# Patient Record
Sex: Male | Born: 1945
Health system: Southern US, Community
[De-identification: ages and names within clinical notes are randomized; demographics above are authoritative.]

## PROBLEM LIST (undated history)

## (undated) DIAGNOSIS — I10 Essential (primary) hypertension: Secondary | ICD-10-CM

## (undated) DIAGNOSIS — R011 Cardiac murmur, unspecified: Secondary | ICD-10-CM

## (undated) DIAGNOSIS — C61 Malignant neoplasm of prostate: Principal | ICD-10-CM

## (undated) DIAGNOSIS — N529 Male erectile dysfunction, unspecified: Secondary | ICD-10-CM

## (undated) DIAGNOSIS — M1990 Unspecified osteoarthritis, unspecified site: Secondary | ICD-10-CM

## (undated) DIAGNOSIS — I350 Nonrheumatic aortic (valve) stenosis: Secondary | ICD-10-CM

## (undated) DIAGNOSIS — T7840XA Allergy, unspecified, initial encounter: Secondary | ICD-10-CM

## (undated) DIAGNOSIS — E785 Hyperlipidemia, unspecified: Secondary | ICD-10-CM

## (undated) HISTORY — DX: Essential (primary) hypertension: I10

## (undated) HISTORY — DX: Hyperlipidemia, unspecified: E78.5

## (undated) HISTORY — PX: TONSILLECTOMY: SUR1361

## (undated) HISTORY — PX: OTHER SURGICAL HISTORY: SHX169

---

## 1999-01-10 ENCOUNTER — Emergency Department (HOSPITAL_COMMUNITY): Admission: EM | Admit: 1999-01-10 | Discharge: 1999-01-10 | Payer: Self-pay | Admitting: Emergency Medicine

## 2005-04-24 ENCOUNTER — Ambulatory Visit: Payer: Self-pay | Admitting: Internal Medicine

## 2005-07-24 ENCOUNTER — Ambulatory Visit: Payer: Self-pay | Admitting: Internal Medicine

## 2006-03-02 ENCOUNTER — Ambulatory Visit: Payer: Self-pay | Admitting: Internal Medicine

## 2006-06-30 ENCOUNTER — Ambulatory Visit: Payer: Self-pay | Admitting: Family Medicine

## 2006-08-07 ENCOUNTER — Ambulatory Visit: Payer: Self-pay | Admitting: Internal Medicine

## 2006-08-07 LAB — CONVERTED CEMR LAB
ALT: 20 units/L (ref 0–40)
BUN: 15 mg/dL (ref 6–23)
Creatinine, Ser: 1.1 mg/dL (ref 0.4–1.5)
Hgb A1c MFr Bld: 5.3 % (ref 4.6–6.0)
Triglycerides: 180 mg/dL — ABNORMAL HIGH (ref 0–149)

## 2006-08-17 ENCOUNTER — Ambulatory Visit: Payer: Self-pay | Admitting: Internal Medicine

## 2006-10-04 ENCOUNTER — Ambulatory Visit: Payer: Self-pay | Admitting: Internal Medicine

## 2006-10-04 LAB — CONVERTED CEMR LAB
ALT: 20 units/L (ref 0–40)
AST: 16 units/L (ref 0–37)
Albumin: 4.1 g/dL (ref 3.5–5.2)
Alkaline Phosphatase: 96 units/L (ref 39–117)
Basophils Absolute: 0 10*3/uL (ref 0.0–0.1)
HCT: 47.8 % (ref 39.0–52.0)
MCHC: 34.5 g/dL (ref 30.0–36.0)
Monocytes Relative: 7.9 % (ref 3.0–11.0)
Platelets: 188 10*3/uL (ref 150–400)
RBC: 5.55 M/uL (ref 4.22–5.81)
RDW: 12.2 % (ref 11.5–14.6)

## 2006-10-15 ENCOUNTER — Encounter: Admission: RE | Admit: 2006-10-15 | Discharge: 2006-10-15 | Payer: Self-pay | Admitting: Internal Medicine

## 2007-07-18 HISTORY — PX: COLONOSCOPY: SHX174

## 2007-07-26 ENCOUNTER — Ambulatory Visit: Payer: Self-pay | Admitting: Internal Medicine

## 2007-07-26 DIAGNOSIS — K209 Esophagitis, unspecified without bleeding: Secondary | ICD-10-CM | POA: Insufficient documentation

## 2007-07-26 DIAGNOSIS — E782 Mixed hyperlipidemia: Secondary | ICD-10-CM | POA: Insufficient documentation

## 2007-07-26 HISTORY — DX: Esophagitis, unspecified without bleeding: K20.90

## 2007-07-26 HISTORY — DX: Mixed hyperlipidemia: E78.2

## 2007-08-23 ENCOUNTER — Ambulatory Visit: Payer: Self-pay | Admitting: Gastroenterology

## 2007-08-23 ENCOUNTER — Ambulatory Visit: Payer: Self-pay | Admitting: Internal Medicine

## 2007-09-05 ENCOUNTER — Encounter (INDEPENDENT_AMBULATORY_CARE_PROVIDER_SITE_OTHER): Payer: Self-pay | Admitting: *Deleted

## 2007-09-05 ENCOUNTER — Ambulatory Visit: Payer: Self-pay | Admitting: Internal Medicine

## 2007-09-05 LAB — CONVERTED CEMR LAB
ALT: 24 units/L (ref 0–53)
AST: 21 units/L (ref 0–37)
Bilirubin, Direct: 0.1 mg/dL (ref 0.0–0.3)
HDL: 27.5 mg/dL — ABNORMAL LOW (ref >39.0)
Hgb A1c MFr Bld: 5.3 % (ref 4.6–6.0)
PSA: 0.74 ng/mL (ref 0.10–4.00)
Total Bilirubin: 0.7 mg/dL (ref 0.3–1.2)
Triglycerides: 80 mg/dL (ref 0–149)

## 2007-09-06 ENCOUNTER — Ambulatory Visit: Payer: Self-pay | Admitting: Gastroenterology

## 2007-09-06 ENCOUNTER — Encounter: Payer: Self-pay | Admitting: Internal Medicine

## 2007-09-06 LAB — HM COLONOSCOPY

## 2008-01-29 ENCOUNTER — Encounter: Payer: Self-pay | Admitting: Internal Medicine

## 2008-01-30 ENCOUNTER — Encounter (INDEPENDENT_AMBULATORY_CARE_PROVIDER_SITE_OTHER): Payer: Self-pay | Admitting: *Deleted

## 2008-01-30 ENCOUNTER — Encounter: Payer: Self-pay | Admitting: Internal Medicine

## 2008-02-25 ENCOUNTER — Ambulatory Visit: Payer: Self-pay | Admitting: Gastroenterology

## 2008-02-25 DIAGNOSIS — M129 Arthropathy, unspecified: Secondary | ICD-10-CM | POA: Insufficient documentation

## 2008-02-25 DIAGNOSIS — H409 Unspecified glaucoma: Secondary | ICD-10-CM | POA: Insufficient documentation

## 2008-02-25 DIAGNOSIS — H4011X0 Primary open-angle glaucoma, stage unspecified: Secondary | ICD-10-CM

## 2008-02-25 HISTORY — DX: Primary open-angle glaucoma, stage unspecified: H40.11X0

## 2008-02-25 HISTORY — DX: Arthropathy, unspecified: M12.9

## 2008-03-02 ENCOUNTER — Telehealth: Payer: Self-pay | Admitting: Gastroenterology

## 2008-03-03 ENCOUNTER — Encounter: Payer: Self-pay | Admitting: Gastroenterology

## 2008-04-10 ENCOUNTER — Encounter: Payer: Self-pay | Admitting: Internal Medicine

## 2008-04-10 ENCOUNTER — Ambulatory Visit: Payer: Self-pay | Admitting: Gastroenterology

## 2008-04-10 ENCOUNTER — Ambulatory Visit (HOSPITAL_COMMUNITY): Admission: RE | Admit: 2008-04-10 | Discharge: 2008-04-10 | Payer: Self-pay | Admitting: Gastroenterology

## 2008-04-15 ENCOUNTER — Encounter: Payer: Self-pay | Admitting: Gastroenterology

## 2008-05-13 ENCOUNTER — Ambulatory Visit: Payer: Self-pay | Admitting: Gastroenterology

## 2009-01-07 ENCOUNTER — Ambulatory Visit: Payer: Self-pay | Admitting: Internal Medicine

## 2009-01-07 DIAGNOSIS — I10 Essential (primary) hypertension: Secondary | ICD-10-CM | POA: Insufficient documentation

## 2009-01-07 DIAGNOSIS — K5730 Diverticulosis of large intestine without perforation or abscess without bleeding: Secondary | ICD-10-CM

## 2009-01-07 DIAGNOSIS — R739 Hyperglycemia, unspecified: Secondary | ICD-10-CM | POA: Insufficient documentation

## 2009-01-07 HISTORY — DX: Hyperglycemia, unspecified: R73.9

## 2009-01-07 HISTORY — DX: Essential (primary) hypertension: I10

## 2009-01-07 HISTORY — DX: Diverticulosis of large intestine without perforation or abscess without bleeding: K57.30

## 2009-01-20 ENCOUNTER — Encounter (INDEPENDENT_AMBULATORY_CARE_PROVIDER_SITE_OTHER): Payer: Self-pay | Admitting: *Deleted

## 2009-01-26 ENCOUNTER — Telehealth (INDEPENDENT_AMBULATORY_CARE_PROVIDER_SITE_OTHER): Payer: Self-pay | Admitting: *Deleted

## 2009-04-01 ENCOUNTER — Ambulatory Visit: Payer: Self-pay | Admitting: Internal Medicine

## 2009-04-04 LAB — CONVERTED CEMR LAB
ALT: 23 units/L (ref 0–53)
AST: 20 units/L (ref 0–37)
Alkaline Phosphatase: 67 units/L (ref 39–117)
Bilirubin, Direct: 0.2 mg/dL (ref 0.0–0.3)
Total Bilirubin: 1.2 mg/dL (ref 0.3–1.2)
Total CHOL/HDL Ratio: 4

## 2009-04-05 ENCOUNTER — Encounter (INDEPENDENT_AMBULATORY_CARE_PROVIDER_SITE_OTHER): Payer: Self-pay | Admitting: *Deleted

## 2009-05-21 ENCOUNTER — Telehealth (INDEPENDENT_AMBULATORY_CARE_PROVIDER_SITE_OTHER): Payer: Self-pay | Admitting: *Deleted

## 2009-06-21 ENCOUNTER — Encounter: Payer: Self-pay | Admitting: Internal Medicine

## 2009-06-22 ENCOUNTER — Ambulatory Visit (HOSPITAL_BASED_OUTPATIENT_CLINIC_OR_DEPARTMENT_OTHER): Admission: RE | Admit: 2009-06-22 | Discharge: 2009-06-22 | Payer: Self-pay | Admitting: Orthopedic Surgery

## 2010-02-17 ENCOUNTER — Encounter (INDEPENDENT_AMBULATORY_CARE_PROVIDER_SITE_OTHER): Payer: Self-pay | Admitting: *Deleted

## 2010-02-17 DIAGNOSIS — M1710 Unilateral primary osteoarthritis, unspecified knee: Secondary | ICD-10-CM

## 2010-02-17 DIAGNOSIS — IMO0002 Reserved for concepts with insufficient information to code with codable children: Secondary | ICD-10-CM

## 2010-02-17 HISTORY — DX: Reserved for concepts with insufficient information to code with codable children: IMO0002

## 2010-03-16 ENCOUNTER — Telehealth (INDEPENDENT_AMBULATORY_CARE_PROVIDER_SITE_OTHER): Payer: Self-pay | Admitting: *Deleted

## 2010-05-06 ENCOUNTER — Ambulatory Visit: Payer: Self-pay | Admitting: Internal Medicine

## 2010-05-07 LAB — CONVERTED CEMR LAB
Alkaline Phosphatase: 81 units/L (ref 39–117)
Basophils Absolute: 0 10*3/uL (ref 0.0–0.1)
Basophils Relative: 0.3 % (ref 0.0–3.0)
Bilirubin, Direct: 0.2 mg/dL (ref 0.0–0.3)
Calcium: 9.1 mg/dL (ref 8.4–10.5)
Chloride: 107 meq/L (ref 96–112)
Eosinophils Absolute: 0.1 10*3/uL (ref 0.0–0.7)
GFR calc non Af Amer: 99.03 mL/min (ref >60)
Glucose, Bld: 95 mg/dL (ref 70–99)
LDL Cholesterol: 77 mg/dL (ref 0–99)
Leukocytes, UA: NEGATIVE
Lymphocytes Relative: 25.4 % (ref 12.0–46.0)
MCHC: 35 g/dL (ref 30.0–36.0)
Neutrophils Relative %: 63.6 % (ref 43.0–77.0)
Nitrite: NEGATIVE
PSA: 1.72 ng/mL (ref 0.10–4.00)
Potassium: 4.6 meq/L (ref 3.5–5.1)
RBC: 5.22 M/uL (ref 4.22–5.81)
Specific Gravity, Urine: 1.025 (ref 1.000–1.030)
Total CHOL/HDL Ratio: 3
Total Protein: 6.4 g/dL (ref 6.0–8.3)
Urine Glucose: NEGATIVE mg/dL
Urobilinogen, UA: 0.2 (ref 0.0–1.0)
VLDL: 14.2 mg/dL (ref 0.0–40.0)

## 2010-05-13 ENCOUNTER — Ambulatory Visit: Payer: Self-pay | Admitting: Internal Medicine

## 2010-05-13 ENCOUNTER — Encounter: Payer: Self-pay | Admitting: Internal Medicine

## 2010-07-17 HISTORY — PX: TOTAL KNEE ARTHROPLASTY: SHX125

## 2010-08-08 LAB — CBC
HCT: 47.6 % (ref 39.0–52.0)
Hemoglobin: 16.9 g/dL (ref 13.0–17.0)
RBC: 5.54 MIL/uL (ref 4.22–5.81)
WBC: 6.5 10*3/uL (ref 4.0–10.5)

## 2010-08-08 LAB — SURGICAL PCR SCREEN: MRSA, PCR: NEGATIVE

## 2010-08-08 LAB — COMPREHENSIVE METABOLIC PANEL
ALT: 18 U/L (ref 0–53)
Albumin: 4.1 g/dL (ref 3.5–5.2)
Alkaline Phosphatase: 86 U/L (ref 39–117)
BUN: 18 mg/dL (ref 6–23)
GFR calc Af Amer: >60 mL/min (ref >60)
Potassium: 4.9 mEq/L (ref 3.5–5.1)
Sodium: 141 mEq/L (ref 135–145)
Total Protein: 7.3 g/dL (ref 6.0–8.3)

## 2010-08-08 LAB — URINALYSIS, ROUTINE W REFLEX MICROSCOPIC
Urine Glucose, Fasting: NEGATIVE mg/dL
pH: 7 (ref 5.0–8.0)

## 2010-08-08 LAB — APTT: aPTT: 30 seconds (ref 24–37)

## 2010-08-08 LAB — PROTIME-INR: INR: 1.09 (ref 0.00–1.49)

## 2010-08-12 ENCOUNTER — Inpatient Hospital Stay (HOSPITAL_COMMUNITY)
Admission: RE | Admit: 2010-08-12 | Discharge: 2010-08-15 | Payer: Self-pay | Source: Home / Self Care | Attending: Orthopedic Surgery | Admitting: Orthopedic Surgery

## 2010-08-12 LAB — TYPE AND SCREEN

## 2010-08-12 LAB — ABO/RH: ABO/RH(D): A POS

## 2010-08-12 NOTE — H&P (Signed)
[name], [name] NO.:  [id number]  MEDICAL RECORD NO.:  [id number]           PATIENT TYPE:  LOCATION:                                 FACILITY:  PHYSICIAN:  [name], [facility]    DATE OF BIRTH:  1946-04-11  DATE OF ADMISSION: DATE OF DISCHARGE:                             HISTORY & PHYSICAL   CHIEF COMPLAINT:  Left knee pain.  BRIEF HISTORY:  [name] has been followed by Dr. Lequita Halt for worsening pain in both knees, worse on the left than the right.  He has had intra-articular cortisone injections with minimal relief. Unfortunately, he continues to have functional issues with the knees and he feels that these are continuing to worsen.  He is unable to perform daily tasks without extreme discomfort.  He now presents for a left total knee arthroplasty and cortisone injection into the right knee.  MEDICATION ALLERGIES:  PENICILLIN, this causes anaphylaxis. Denies allergies to food, latex or metal.  PRIMARY CARE PHYSICIAN:  [name]. Jack Ren, MD, FACP, FCCP.  He has been cleared for surgery by Dr. Alwyn Bryant.  GASTROENTEROLOGIST:  [name]. Jack Dice, MD, Clementeen Graham.  CURRENT MEDICATIONS: 1. Benazepril HCl 20 mg 1/2 tablet daily. 2. Vytorin 10/20 mg 1 tablet p.o. q.h.s. 3. Aspirin 81 mg 1 tablet p.o. daily.  This has been discontinued. 4. Betimol 0.25% solution 1 drop right eye daily. 5. Advil as needed for pain.  He has, however, discontinued this.  PAST MEDICAL HISTORY: 1. End-stage arthritis of both knees, left worse than right. 2. Tinnitus. 3. Hypertension. 4. Heart murmur. 5. Hyperlipidemia. 6. Gastroesophageal reflux disease. 7. Arthritis.  PAST SURGICAL HISTORY: 1. Tonsillectomy. 2. Surgery for an undescended testicle.  FAMILY MEDICAL HISTORY:  Father passed at the age of 32 of CVA.  Mother passed at the age of 103 of myocardial infarction.  SOCIAL HISTORY:  The patient is married.  He works as an Biomedical scientist. He denies past or present  use of tobacco or alcohol.  He lives at home with his wife, and he does plan to go home following his hospital stay. His wife is lined up to be his caregiver.  REVIEW OF SYSTEMS:  GENERAL:  Negative for fevers, chills, or weight change.  HEENT/NEURO:  Positive for ringing in the ears.  Negative for headache, blurred vision, or blackout spells.  DERMATOLOGIC:  Negative for a rash or lesion.  RESPIRATORY:  Negative for shortness of breath at rest or with exertion.  CARDIOVASCULAR:  Negative for chest pain or palpitations.  GI:  Positive for occasional nausea and abdominal pain. GU: Positive for urinating at nighttime.  Negative for hematuria or dysuria.  MUSCULOSKELETAL:  Positive for joint pain, muscular pain, back pain, and morning stiffness.  PHYSICAL EXAMINATION:  VITAL SIGNS:  Pulse 80, respirations 18, blood pressure 122/82 in the left arm. GENERAL:  [name] is alert and oriented x3.  He is a well- developed, well-nourished 65 year old male.  He has a stated height of 61 inches and a stated weight of 255 pounds.  He is in no apparent distress. NECK:  Supple, full range of motion without lymphadenopathy. CHEST:  Lungs  are clear to auscultation bilaterally without wheezes, rhonchi, or rales. HEART:  Regular rate and rhythm without murmur. ABDOMEN:  Bowel sounds present in all 4 quadrants.  No tenderness with palpation. EXTREMITIES:  Both knees reveal marked crepitus throughout the range of motion.  He has valgus deformity bilaterally, worse on the left than the right. SKIN:  Unremarkable. NEUROLOGIC:  Intact peripheral vascular, carotid pulses 2+ bilaterally without bruit.  RADIOGRAPHS:  AP lateral views of both knees, end-stage arthritic changes tricompartmentally, left worse than right.  Valgus deformity bilaterally.  IMPRESSION:  End-stage arthritis of both knees, left worse than right.  PLAN:  Left total knee arthroplasty with cortisone injection into the right  knee to be performed by Dr. Lequita Halt.     [name], [facility]     LD/MEDQ  D:  08/11/2010  T:  08/11/2010  Job:  [id number]  cc:   [name]. Jack Ren, MD,FACP,FCCP [phone] [place] [address]  Electronically Signed by [name]  on 08/12/2010 02:28:25 PM

## 2010-08-13 LAB — BASIC METABOLIC PANEL
Calcium: 8.5 mg/dL (ref 8.4–10.5)
GFR calc Af Amer: >60 mL/min (ref >60)
GFR calc non Af Amer: >60 mL/min (ref >60)
Potassium: 4.5 mEq/L (ref 3.5–5.1)
Sodium: 135 mEq/L (ref 135–145)

## 2010-08-13 LAB — CBC
Platelets: 162 10*3/uL (ref 150–400)
RDW: 12.8 % (ref 11.5–15.5)
WBC: 16.5 10*3/uL — ABNORMAL HIGH (ref 4.0–10.5)

## 2010-08-14 LAB — CONVERTED CEMR LAB
ALT: 23 units/L (ref 0–53)
AST: 23 units/L (ref 0–37)
BUN: 19 mg/dL (ref 6–23)
Basophils Absolute: 0 10*3/uL (ref 0.0–0.1)
Basophils Relative: 0 % (ref 0.0–3.0)
Bilirubin, Direct: 0 mg/dL (ref 0.0–0.3)
CO2: 27 meq/L (ref 19–32)
Chloride: 106 meq/L (ref 96–112)
Creatinine, Ser: 0.9 mg/dL (ref 0.4–1.5)
Eosinophils Absolute: 0.1 10*3/uL (ref 0.0–0.7)
HDL goal, serum: 40 mg/dL
Hemoglobin: 17.3 g/dL — ABNORMAL HIGH (ref 13.0–17.0)
Hgb A1c MFr Bld: 5.4 % (ref 4.6–6.5)
LDL Goal: 130 mg/dL
Lymphocytes Relative: 27.5 % (ref 12.0–46.0)
Lymphs Abs: 1.8 10*3/uL (ref 0.7–4.0)
MCHC: 34.5 g/dL (ref 30.0–36.0)
MCV: 87.4 fL (ref 78.0–100.0)
Monocytes Absolute: 0.6 10*3/uL (ref 0.1–1.0)
Neutro Abs: 3.9 10*3/uL (ref 1.4–7.7)
Potassium: 4.5 meq/L (ref 3.5–5.1)
RDW: 12.7 % (ref 11.5–14.6)
Total Protein: 7.3 g/dL (ref 6.0–8.3)

## 2010-08-14 LAB — CBC
HCT: 35.8 % — ABNORMAL LOW (ref 39.0–52.0)
Hemoglobin: 12.4 g/dL — ABNORMAL LOW (ref 13.0–17.0)
RDW: 13.1 % (ref 11.5–15.5)
WBC: 14.5 10*3/uL — ABNORMAL HIGH (ref 4.0–10.5)

## 2010-08-14 LAB — BASIC METABOLIC PANEL
CO2: 29 mEq/L (ref 19–32)
GFR calc Af Amer: >60 mL/min (ref >60)
GFR calc non Af Amer: >60 mL/min (ref >60)
Glucose, Bld: 124 mg/dL — ABNORMAL HIGH (ref 70–99)
Potassium: 4.3 mEq/L (ref 3.5–5.1)
Sodium: 138 mEq/L (ref 135–145)

## 2010-08-15 LAB — CBC
HCT: 34 % — ABNORMAL LOW (ref 39.0–52.0)
Hemoglobin: 11.7 g/dL — ABNORMAL LOW (ref 13.0–17.0)
MCHC: 34.4 g/dL (ref 30.0–36.0)
RBC: 3.93 MIL/uL — ABNORMAL LOW (ref 4.22–5.81)

## 2010-08-16 NOTE — Progress Notes (Signed)
Summary: lab order  Phone Note Call from Patient   Summary of Call: patient is scheduled for cpx [id number] - lab at [facility] [id number] - need lab order Initial call taken by: Okey Regal Spring,  March 16, 2010 2:42 PM  Follow-up for Phone Call        Lipid,Hep,BMP,TSH,CBCD, PSA, Udip, Stool Cards v70.0/272.4/995.20/401.9 Follow-up by: Shonna Chock CMA,  March 16, 2010 2:59 PM  Additional Follow-up for Phone Call Additional follow up Details #1::        lab order added to appt .Marland KitchenOkey Regal Spring  March 17, 2010 8:39 AM

## 2010-08-16 NOTE — Miscellaneous (Signed)
Summary: Orders Update   Clinical Lists Changes  Problems: Added new problem of DEGENERATIVE JOINT DISEASE, BOTH KNEES, SEVERE (ICD-715.96) Orders: Added new Referral order of Orthopedic Surgeon Referral (Ortho Surgeon) - Signed

## 2010-08-16 NOTE — Assessment & Plan Note (Signed)
Summary: CPX /CBS   Vital Signs:  Patient profile:   65 year old male Height:      72.25 inches Weight:      258 pounds BMI:     34.87 Temp:     98.5 degrees F oral Pulse rate:   72 / minute Resp:     16 per minute BP sitting:   126 / 84  (left arm) Cuff size:   large  Vitals Entered By: Shonna Chock CMA (May 13, 2010 1:48 PM)   Primary Care Provider:  [name], M.D.  CC:  Lower Extremity Joint pain.  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      He is also  presents for Hyperlipidemia follow-up.  The patient denies muscle aches, GI upset, abdominal pain, flushing, itching, constipation, diarrhea, and fatigue.  The patient denies the following symptoms: chest pain/pressure, exercise intolerance, dypsnea, palpitations, syncope, and pedal edema.  Compliance with medications (by patient report) has been near 100%.  Dietary compliance has been good.  The patient reports no exercise due to  DJD of knees.        The patient also presents for Hypertension follow-up.  The patient denies lightheadedness, urinary frequency, and headaches.  Compliance with medications (by patient report) has been near 100%.  Adjunctive measures currently used by the patient include salt restriction.  BP averages in  120s/low 80s.      The patient  sees Dr Despina Hick for knee pain; S/P steroid injections X 1. L TKR is being discussed. The patient reports popping and decreased ROM, but denies swelling, redness, giving away, locking, stiffness for >1 hr, and weakness.  The pain is described as aching, intermittent, and activity related.    Lipid Management History:      Positive NCEP/ATP III risk factors include male age 65 years old or older, HDL cholesterol less than 40, family history for ischemic heart disease (females less than 39 years old), and hypertension.  Negative NCEP/ATP III risk factors include non-diabetic, non-tobacco-user status, no ASHD  (atherosclerotic heart disease), no prior stroke/TIA, no peripheral vascular disease, and no history of aortic aneurysm.     Current Medications (verified): 1)  Benazepril Hcl 20 Mg  Tabs (Benazepril Hcl) .... 1/2 Tab By Mouth Once Daily 2)  Vytorin 10-20 Mg  Tabs (Ezetimibe-Simvastatin) .Marland Kitchen.. 1 At Bedtime (Ldl Goal = < 70) 3)  Betimol 0.25 %  Soln (Timolol) .... Insert 1 Drop Into Right Eye Daily  Allergies: 1)  ! Pcn  Past History:  Past Medical History: GERD Hyperlipidemia: Framingham Study LDL goal = < 130. NMR Lipoprofile : LDL 93(1800/ 1237) , HDL 38, TG 153. Hypertension Diverticulosis, colon Glaucoma , open angle ; DJD ; Vitiligo  Past Surgical History: Undescended testicle surgically dropped @ 5  Tonsillectomy LBS, hospitalized five days (no surgery) Flex sigmoid 2000; colonoscopy 2009 : Tics  , due 2019; Endoscopy  2009 negative, Dr Arlyce Dice  Family History: mother: MI @ 68, smoker father: CVA; twin bro: glaucoma No FH of Colon Cancer:  Social History: Never Smoked Alcohol Use - no Daily Caffeine Use-1/2 cup daily Married  Review of Systems  The patient denies anorexia, fever, vision loss, decreased hearing, hoarseness, prolonged cough, hemoptysis, abdominal pain, melena, hematochezia, severe indigestion/heartburn, hematuria, suspicious skin lesions, depression, unusual weight change, abnormal bleeding, enlarged lymph nodes, and angioedema.    Physical Exam  General:  well-nourished;alert,appropriate and cooperative throughout examination  Head:  Normocephalic and atraumatic without obvious abnormalities.  Eyes:  No corneal or conjunctival inflammation noted.  Perrla. Funduscopic exam benign, without hemorrhages, exudates or papilledema.  Ears:  External ear exam shows no significant lesions or deformities.  Otoscopic examination reveals clear canals, tympanic membranes are intact bilaterally without bulging, retraction, inflammation or discharge. Hearing is  grossly normal bilaterally. Nose:  External nasal examination shows no deformity or inflammation. Nasal mucosa are pink and moist without lesions or exudates. Septal deviation Mouth:  Oral mucosa and oropharynx without lesions or exudates.  Teeth in good repair. Neck:  No deformities, masses, or tenderness noted. Lungs:  Normal respiratory effort, chest expands symmetrically. Lungs are clear to auscultation, no crackles or wheezes. Heart:  normal rate, regular rhythm, no gallop, no rub, no JVD, no HJR, and grade 1.5  /6 systolic murmur loudest @ R base.   Abdomen:  Bowel sounds positive,abdomen soft and non-tender without masses, organomegaly or hernias noted. Rectal:  No external abnormalities noted. Normal sphincter tone. No rectal masses or tenderness. Genitalia:  Testes bilaterally descended without nodularity, tenderness or masses. L atrophic. No scrotal masses or lesions. No penis lesions or urethral discharge. Prostate:  Prostate gland firm and smooth, no enlargement, nodularity, tenderness, mass, asymmetry or induration. Msk:  No deformity or scoliosis noted of thoracic or lumbar spine but R thoracic muscles > L.   Pulses:  R and L carotid,radial,dorsalis pedis and posterior tibial pulses are full and equal bilaterally Extremities:  No clubbing, cyanosis, edema. L 3rd -5th fingers deviated laterally ( post fractures). Varus knee changes with marked crepitus Neurologic:  alert & oriented X3 and DTRs symmetrical and normal.   Skin:  Vitiligo of hands  Cervical Nodes:  No lymphadenopathy noted Axillary Nodes:  No palpable lymphadenopathy Inguinal Nodes:  No significant adenopathy Psych:  memory intact for recent and remote, normally interactive, and good eye contact.     Impression & Recommendations:  Problem # 1:  ROUTINE GENERAL MEDICAL EXAM@HEALTH  CARE FACL (ICD-V70.0)  Orders: EKG w/ Interpretation (93000)  Problem # 2:  HYPERLIPIDEMIA (ICD-272.2)  His updated medication list  for this problem includes:    Vytorin 10-20 Mg Tabs (Ezetimibe-simvastatin) .Marland Kitchen... 1 at bedtime (ldl goal = < 70)  Problem # 3:  HYPERTENSION (ICD-401.9) controlled His updated medication list for this problem includes:    Benazepril Hcl 20 Mg Tabs (Benazepril hcl) .Marland Kitchen... 1/2 tab by mouth once daily  Problem # 4:  DEGENERATIVE JOINT DISEASE, BOTH KNEES, SEVERE (ICD-715.96) as per Dr Despina Hick. He is cleared medically for surgery.  Complete Medication List: 1)  Benazepril Hcl 20 Mg Tabs (Benazepril hcl) .... 1/2 tab by mouth once daily 2)  Vytorin 10-20 Mg Tabs (Ezetimibe-simvastatin) .Marland Kitchen.. 1 at bedtime (ldl goal = < 70) 3)  Betimol 0.25 % Soln (Timolol) .... Insert 1 drop into right eye daily  Other Orders: Tdap => 38yrs IM ([address]) Admin 1st Vaccine (98119)  Lipid Assessment/Plan:      Based on NCEP/ATP III, the patient's risk factor category is "0-1 risk factors".  The patient's lipid goals are as follows: Total cholesterol goal is 200; LDL cholesterol goal is 130; HDL cholesterol goal is 40; Triglyceride goal is 150.  His LDL cholesterol goal has been met.    Patient Instructions: 1)  Take an  81 mg  coated Aspirin every day. Consider Pravastatin  40 mg at bedtime in place of Vytorin 10/40 with fasting labs 10 weeks later ( lipids, hepatic panel; 272.4, 995.20) Prescriptions:  BENAZEPRIL HCL 20 MG  TABS (BENAZEPRIL HCL) 1/2 tab by mouth once daily  #90 Tablet x 3   Entered by:   Shonna Chock CMA   Authorized by:   [name] MD   Signed by:   Shonna Chock CMA on 05/13/2010   Method used:   Electronically to        [facility] (retail)       [address]       [place], [place]  [address]       Ph: [phone]       Fax: [phone]   RxID:   [id number]    Orders Added: 1)  Est. Patient 40-64 years [99396] 2)  EKG w/ Interpretation [93000] 3)  Tdap => 78yrs IM [90715] 4)  Admin 1st Vaccine [90471]   Immunizations  Administered:  Tetanus Vaccine:    Vaccine Type: Tdap    Site: right deltoid    Mfr: GlaxoSmithKline    Dose: 0.5 ml    Route: IM    Given by: Shonna Chock CMA    Exp. Date: 05/05/2012    Lot #: [id number]   Immunizations Administered:  Tetanus Vaccine:    Vaccine Type: Tdap    Site: right deltoid    Mfr: GlaxoSmithKline    Dose: 0.5 ml    Route: IM    Given by: Shonna Chock CMA    Exp. Date: 05/05/2012    Lot #: [id number]

## 2010-08-17 NOTE — Op Note (Signed)
[name], [name]            ACCOUNT NO.:  [id number]  MEDICAL RECORD NO.:  [id number]          PATIENT TYPE:  INP  LOCATION:  0003                         FACILITY[facility]  PHYSICIAN:  Ollen Gross, M.D.    DATE OF BIRTH:  06-Mar-1946  DATE OF PROCEDURE: DATE OF DISCHARGE:                              OPERATIVE REPORT   PREOPERATIVE DIAGNOSIS:  Osteoarthritis, bilateral knees.  POSTOPERATIVE DIAGNOSIS:  Osteoarthritis, bilateral knees.  PROCEDURE: 1. Left total knee arthroplasty. 2. Right knee cortisone injection.  SURGEON:  Ollen Gross, MD  ASSISTANT:  Alexzandrew L. Perkins, PA-C  ANESTHESIA:  Spinal.  ESTIMATED BLOOD LOSS:  Minimal.  DRAIN:  Hemovac x1, left knee.  TOURNIQUET TIME:  42 minutes at 300 mmHg.  COMPLICATIONS:  None.  CONDITION:  Stable to recovery.  BRIEF CLINICAL NOTE:  [name] is a 65 year old male who has end-stage arthritis in both knees, left more symptomatic than the right.  He has had failed nonoperative management, presents now for left total knee arthroplasty and right knee cortisone injection.  PROCEDURE IN DETAIL:  After successful administration of spinal anesthetic, a tourniquet was placed high on his left thigh and his left lower extremity prepped and draped in usual sterile fashion. Extremities wrapped in Esmarch, knee flexed, tourniquet inflated to 300 mmHg.  Midline incision was made with a 10-blade through the subcutaneous tissue to the level of the extensor mechanism.  He has a severe valgus deformity, that is why I made a lateral parapatellar approach.  Fresh blade was used to make a lateral parapatellar arthrotomy.  Soft tissue over the proximal and lateral tibias then subperiosteally elevated to the joint line and posterolateral corner but not including the structures of the posterolateral corner.  Patella was everted medially, knee flexed 90 degrees, and ACL and PCL removed. Drill was used create a starting hole in  the distal femur and the canal was thoroughly irrigated.  The 5-degree left valgus alignment guide is placed and the distal femoral cutting block pinned to remove a 11 mm off the distal femur.  Distal femoral resection is made with an oscillating saw.  Tibia subluxed forward and menisci removed.  The extramedullary tibial alignment guide is placed referencing proximally at the medial aspect of the tibial tubercle and distally along the second metatarsal axis and tibial crest.  The block is pinned to remove 2 mm off the more deficient lateral side.  Tibial resection is made with an oscillating saw.  Size 4 is the most appropriate tibial component and the proximal tibia was prepared to modular drill and keel punch for the size 4.  Femoral sizing guide was placed, size 5 is most appropriate.  Rotations marked off the epicondylar axis and confirmed by creating rectangular flexion gap at 90 degrees.  Size 5 cutting block is placed and the anterior-posterior and chamfer cuts made.  Intercondylar block was placed and neck cuts made.  Trial size 5 posterior stabilized femur was placed.  A 10-mm posterior stabilized rotating platform insert trial was placed.  With the 10, full extensions achieved with excellent varus- valgus and anterior-posterior balance throughout full range of motion. Patella was everted,  thickness measured to be 24 mm.  Freehand resection taken to 14 mm, 38 template is placed, lug holes were drilled, trial patella was placed and it tracks normally.  Osteophytes removed off the posterior femur with the trial in place.  All trials were removed and the cut bone surfaces prepared with pulsatile lavage.  Cement was mixed and once ready for implantation, a size 4 mobile bearing tibial tray and size 5 posterior stabilized femur and 38 patella cemented into place and patella was held with a clamp.  Trial 10-mm inserts placed, knee held in full extension, all extruded cement removed.   When the cement fully hardened, then the permanent 10-mm posterior stabilized rotating platform insert is placed in the tibial tray.  Wounds copiously irrigated with saline solution and the arthrotomy closed over Hemovac drain with interrupted #1 PDS leaving open a small area from the superior to inferior pole of the patella to serve as a mini lateral release.  Flexion against the gravity is 135 degrees and patella tracks normally.  Tourniquet has already been released after 42 minutes.  Subcu was closed with interrupted 2-0 Vicryl and subcuticular running 4-0 Monocryl.  The incisions cleaned and dried and Steri-Strips applied. The catheter for Marcaine pain pumps placed and pumps initiated.  A bulky sterile dressing was applied and he was placed into a knee immobilizer.  I then prepped the right knee, injected with 6 mL of 1% lidocaine and 80 mg of Depo-Medrol.  He tolerated both procedures well without complications.  Left lower extremity was then placed into the knee immobilizer.  He is awakened, transported to recovery in stable condition.     Ollen Gross, M.D.     FA/MEDQ  D:  08/12/2010  T:  08/12/2010  Job:  [id number]  Electronically Signed by Ollen Gross M.D. on 08/17/2010 04:54:09 PM

## 2010-09-07 NOTE — Discharge Summary (Signed)
[name], Bryant            ACCOUNT NO.:  [id number]  MEDICAL RECORD NO.:  [id number]          PATIENT TYPE:  INP  LOCATION:  1607                         [facility]ACILITY[facility]  PHYSICIAN:  Ollen Gross, M.D.    DATE OF BIRTH:  1945/12/20  DATE OF ADMISSION:  08/12/2010 DATE OF DISCHARGE:  08/15/2010                              DISCHARGE SUMMARY   ADMITTING DIAGNOSES: 1. Osteoarthritis, left knee, greater than right knee. 2. Tinnitus. 3. Hypertension. 4. Cardiac murmur. 5. Hyperlipidemia. 6. Gastroesophageal reflux disease. 7. Osteoarthritis.  DISCHARGE DIAGNOSES: 1. Osteoarthritis, bilateral knees, status post left total knee     replacement arthroplasty with cortisone injection into the right     knee. 2. Tinnitus. 3. Hypertension. 4. Cardiac murmur. 5. Hyperlipidemia. 6. Gastroesophageal reflux disease. 7. Osteoarthritis.  PROCEDURE:  August 12, 2010, left total knee arthroplasty with a right knee cortisone injection.  SURGEON:  Ollen Gross, M.D.  ASSISTANT:  Alexzandrew L. Perkins, P.A.C.  ANESTHESIA:  Spinal anesthesia.  TOURNIQUET TIME:  42 minutes.  CONSULTS:  None.  BRIEF HISTORY:  The patient is a 65 year old male with end-stage arthritis of both knees, left is more problematic and symptomatic than the right, failed nonoperative management, now presents for total knee arthroplasty on the left  cortisone injection in the right.  LABORATORY DATA:  Preop CBC showed a hemoglobin of 16.9, hematocrit of 47.6, white cell count 6.5, platelets 181.  Postop hemoglobin 13.9, then 12.4.  Last noted hemoglobin 11.7 and hematocrit 34.0.  PT/INR 14.3 and 1.09 with a PTT of 30.  Chem panel on admission all within normal limits.  Serial BMETs were followed, electrolytes remained within normal limits; glucose went up from 116 to 161, back down to 124.  Preop UA negative.  Blood group type A positive.  Nasal swabs were positive for Staph aureus, but negative for  MRSA.  HOSPITAL COURSE:  The patient was admitted to [facility], taken to OR, underwent the above-stated procedure without complication. The patient tolerated the procedure well, later transferred to recovery room and then to orthopedic floor, started on p.o. and IV analgesics for pain control following surgery.  Doing pretty well on the morning of day #1, weaned over to pills.  Hemovac drain pulled without difficulty, excellent urinary output.  Starting to get up out of bed, working with therapy.  By day #2, he was doing much better, progressing well. Dressing changed, incision looked good.  Hemoglobin was stable.  He remained in the hospital.  By day #3, he was seen by Dr. Lequita Halt, doing great, meeting his goals, and discharged home.  DISCHARGE PLAN: 1. The patient was discharged home on August 15, 2010. 2. Discharge diagnoses, please see above. 3. Discharge meds:  Percocet, Xarelto, and Robaxin.  Continue aspirin,     benazepril, timolol eye drops, and Vytorin  DIET:  Heart-healthy diet.ACTIVITY:  He is weightbearing as tolerated, total knee protocol, home health PT, home nursing.  FOLLOWUP:  In 2 weeks.  DISPOSITION:  Home.  CONDITION ON DISCHARGE:  Improved.     Alexzandrew L. Julien Girt, P.A.C.   ______________________________ Ollen Gross, M.D.    ALP/MEDQ  D:  09/01/2010  T:  09/02/2010  Job:  [id number]  cc:   Titus Dubin. Alwyn Ren, MD,FACP,FCCP [phone] [place] [address]  Electronically Signed by Patrica Duel P.A.C. on 09/06/2010 07:47:13 AM Electronically Signed by Ollen Gross M.D. on 09/07/2010 04:47:16 PM

## 2010-11-30 ENCOUNTER — Other Ambulatory Visit: Payer: Self-pay

## 2010-11-30 MED ORDER — PRAVASTATIN SODIUM 40 MG PO TABS: 40.0000 mg | ORAL_TABLET | Freq: Every day | ORAL | Status: DC

## 2010-12-02 NOTE — Assessment & Plan Note (Signed)
[facility]                                 ON-CALL NOTE   CLEATIS, FANDRICH                     MRN:          [id number]  DATE:06/30/2006                            DOB:          06-10-1946    DATE OF INTERACTION:  June 30, 2006 at 12:30 p.m.   PHONE NUMBER:  [phone]   OBJECTIVE:  The patient is light-headed with nausea.  Took benazepril,  which he thinks might be causing the problem.   ASSESSMENT:  Light-headedness.   PLAN:  Come in to be seen.   PRIMARY CARE Yennifer Segovia:  Dr. Alwyn Ren.  Home office is [place].     Arta Silence, MD  Electronically Signed    RNS/MedQ  DD: 07/01/2006  DT: 07/01/2006  Job #: [id number]

## 2010-12-02 NOTE — Op Note (Signed)
NAMEZEN, CEDILLOS            ACCOUNT NO.:  [id number]   MEDICAL RECORD NO.:  [id number]          PATIENT TYPE:  AMB   LOCATION:  ENDO                         FACILITY:  [facility]   PHYSICIAN:  Barbette Hair. Arlyce Dice, MD,FACGDATE OF BIRTH:  07/25/1945   DATE OF PROCEDURE:  04/15/2008  DATE OF DISCHARGE:  04/10/2008                               OPERATIVE REPORT   PROCEDURE PERFORMED:  48 hour Bravo pH study.   A 48 hour pH study was done with the patient on his PPI therapy.   FINDINGS:  1. On day 1 there were 17 episodes of reflux.  15 occurred in the      upright position and 16 occurred postprandially.  Fraction of time      below pH 4 was 1.5.  Most notably multiple episodes of coughing      totaling 1 hour and 36 minutes.  Total DeMeester score for day 1      was 7.4.   1. On day 2 there were 20 reflux episodes.  Fraction of time below pH      4 was 0.9.  Again, there were multiple coughing episodes lasting 1      hour and 39 minutes.  DeMeester score was 3.6.   Findings demonstrate no significant acid reflux.  Cough seems to be the  predominant symptom as secondly, there is a significant amount of  belching per the patient's diary.      Barbette Hair. Arlyce Dice, MD,FACG  Electronically Signed     RDK/MEDQ  D:  04/15/2008  T:  04/15/2008  Job:  [id number]

## 2010-12-15 ENCOUNTER — Other Ambulatory Visit (INDEPENDENT_AMBULATORY_CARE_PROVIDER_SITE_OTHER): Payer: BC Managed Care – PPO

## 2010-12-15 DIAGNOSIS — E785 Hyperlipidemia, unspecified: Secondary | ICD-10-CM

## 2010-12-15 DIAGNOSIS — T887XXA Unspecified adverse effect of drug or medicament, initial encounter: Secondary | ICD-10-CM

## 2010-12-15 LAB — HEPATIC FUNCTION PANEL
ALT: 18 U/L (ref 0–53)
Albumin: 4 g/dL (ref 3.5–5.2)
Alkaline Phosphatase: 83 U/L (ref 39–117)
Total Protein: 6.7 g/dL (ref 6.0–8.3)

## 2010-12-15 LAB — LIPID PANEL
Cholesterol: 155 mg/dL (ref 0–200)
LDL Cholesterol: 90 mg/dL (ref 0–99)
Triglycerides: 142 mg/dL (ref 0.0–149.0)

## 2011-04-25 ENCOUNTER — Other Ambulatory Visit: Payer: Self-pay | Admitting: Internal Medicine

## 2011-05-15 ENCOUNTER — Ambulatory Visit (INDEPENDENT_AMBULATORY_CARE_PROVIDER_SITE_OTHER): Payer: Commercial Managed Care - PPO | Admitting: Internal Medicine

## 2011-05-15 ENCOUNTER — Encounter: Payer: Self-pay | Admitting: Internal Medicine

## 2011-05-15 VITALS — BP 142/88 | HR 118 | Temp 98.8°F | Resp 14 | Ht 72.0 in | Wt 250.8 lb

## 2011-05-15 DIAGNOSIS — R7309 Other abnormal glucose: Secondary | ICD-10-CM

## 2011-05-15 DIAGNOSIS — Z0000 Encounter for general adult medical examination without abnormal findings: Secondary | ICD-10-CM

## 2011-05-15 DIAGNOSIS — E782 Mixed hyperlipidemia: Secondary | ICD-10-CM

## 2011-05-15 DIAGNOSIS — I10 Essential (primary) hypertension: Secondary | ICD-10-CM

## 2011-05-15 LAB — CBC WITH DIFFERENTIAL/PLATELET
Basophils Relative: 0.5 % (ref 0.0–3.0)
Eosinophils Absolute: 0.1 10*3/uL (ref 0.0–0.7)
Eosinophils Relative: 1 % (ref 0.0–5.0)
Lymphocytes Relative: 19.5 % (ref 12.0–46.0)
Neutrophils Relative %: 70.4 % (ref 43.0–77.0)
Platelets: 186 10*3/uL (ref 150.0–400.0)
RBC: 5.52 Mil/uL (ref 4.22–5.81)
WBC: 5.6 10*3/uL (ref 4.5–10.5)

## 2011-05-15 LAB — LIPID PANEL
Cholesterol: 185 mg/dL (ref 0–200)
HDL: 39.5 mg/dL (ref >39.00)
LDL Cholesterol: 127 mg/dL — ABNORMAL HIGH (ref 0–99)
Total CHOL/HDL Ratio: 5
Triglycerides: 94 mg/dL (ref 0.0–149.0)

## 2011-05-15 LAB — HEPATIC FUNCTION PANEL
ALT: 17 U/L (ref 0–53)
Albumin: 4.3 g/dL (ref 3.5–5.2)
Alkaline Phosphatase: 86 U/L (ref 39–117)
Total Protein: 7 g/dL (ref 6.0–8.3)

## 2011-05-15 LAB — BASIC METABOLIC PANEL
BUN: 18 mg/dL (ref 6–23)
Calcium: 9.4 mg/dL (ref 8.4–10.5)
Chloride: 105 mEq/L (ref 96–112)
Creatinine, Ser: 0.9 mg/dL (ref 0.4–1.5)
GFR: 87.65 mL/min (ref >60.00)

## 2011-05-15 MED ORDER — PRAVASTATIN SODIUM 40 MG PO TABS: 40.0000 mg | ORAL_TABLET | Freq: Every day | ORAL | Status: DC

## 2011-05-15 NOTE — Progress Notes (Signed)
Subjective:    Patient ID: [name], male    DOB: Jun 01, 1946, 65 y.o.   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      Review of Systems HYPERTENSION: Disease Monitoring: Blood pressure range-120-122/80-82  Chest pain, palpitations- no       Dyspnea- no Medications: Compliance-yes, 1/2 of  Benazepril 20 mg daily  Lightheadedness,Syncope- isolated episode working in heat    Edema- no  FASTING HYPERGLYCEMIA: Disease Monitoring: Blood Sugar ranges-not monitored  Polyuria/phagia/dipsia- no       Visual problems- no Diet: high fiber   HYPERLIPIDEMIA: Medications: Compliance- yes  Abd pain, bowel changes- no   Muscle aches- low back pain in am          Objective:   Physical Exam Gen.: Healthy and well-nourished in appearance. Alert, appropriate and cooperative throughout exam. Head: occipital boss Eyes: No corneal or conjunctival inflammation noted. Pupils unequal ,OS > OD .Fundal exam is benign without hemorrhages, exudate, papilledema. Extraocular motion intact. Vision grossly decreased in OS even with lenses. Ears: External  ear exam reveals no significant lesions or deformities. Canals clear .TMs normal. Hearing is grossly normal bilaterally. Nose: External nasal exam reveals no deformity or inflammation. Nasal mucosa are pink and moist. No lesions or exudates noted. Septum dislocated to R  Mouth: Oral mucosa and oropharynx reveal no lesions or exudates. Teeth in good repair. Neck: No deformities, masses, or tenderness noted. Range of motion slightly reduced. Thyroid normal. Lungs: Normal respiratory effort; chest expands symmetrically. Lungs are clear to auscultation without rales, wheezes, or increased work of breathing. Heart: Normal rate and rhythm. Normal S1 and S2. No gallop, click, or rub. Grade 1-1.5 diffuse systolic  murmur. Abdomen: Bowel sounds normal; abdomen soft and nontender. No masses, organomegaly or  hernias noted. Genitalia/ DRE: Left testicle is not palpable(undescended testicle was brought down at age 65 but never developed by history). Prostate is upper limits of normal to minimally enlarged without definite nodularity or induration.   .                                                                                   Musculoskeletal/extremities: No deformity or scoliosis noted of  the thoracic or lumbar spine. No clubbing, cyanosis, edema, or deformity noted. Range of motion  Decreased @ knees .Tone & strength  Normal.Joints: flexion contractures L hand . Nail health  good. Vascular: Carotid, radial artery, dorsalis pedis and  posterior tibial pulses are full and equal. No bruits present. Neurologic: Alert and oriented x3. Deep tendon reflexes symmetrical and normal.          Skin: Intact without suspicious lesions or rashes. Trapezoid 5X 5 mm nevus R forearm. Keratosis R cheek Lymph: No cervical, axillary, or inguinal lymphadenopathy present. Psych: Mood and affect are normal. Normally interactive  Assessment & Plan:  #1 comprehensive physical exam; no acute findings #2 see Problem List with Assessments & Recommendations Plan: see Orders

## 2011-05-15 NOTE — Patient Instructions (Signed)
Preventive Health Care: Exercise at least 30-45 minutes a day,  3-4 days a week.  Eat a low-fat diet with lots of fruits and vegetables, up to 7-9 servings per day. Consume less than 40 grams of sugar per day from foods & drinks with High Fructose Corn Sugar as # 1,2,3 or # 4 on label.  

## 2011-08-03 ENCOUNTER — Other Ambulatory Visit: Payer: Self-pay | Admitting: Internal Medicine

## 2011-08-22 ENCOUNTER — Telehealth: Payer: Self-pay | Admitting: Internal Medicine

## 2011-08-22 ENCOUNTER — Other Ambulatory Visit: Payer: Self-pay | Admitting: Internal Medicine

## 2011-08-22 DIAGNOSIS — Z0000 Encounter for general adult medical examination without abnormal findings: Secondary | ICD-10-CM

## 2011-08-22 NOTE — Telephone Encounter (Signed)
Patient states that he will go to [place] [facility] next week.

## 2011-08-22 NOTE — Telephone Encounter (Signed)
Patient states that he needs to have his PSA checked. He will be going to [place] [facility]. Can you schedule this for me? Per letter from Dr. Alwyn Ren last November.

## 2011-08-28 ENCOUNTER — Other Ambulatory Visit (INDEPENDENT_AMBULATORY_CARE_PROVIDER_SITE_OTHER): Payer: Commercial Managed Care - PPO

## 2011-08-28 DIAGNOSIS — Z0000 Encounter for general adult medical examination without abnormal findings: Secondary | ICD-10-CM

## 2011-08-28 LAB — PSA: PSA: 2.87 ng/mL (ref 0.10–4.00)

## 2012-08-13 ENCOUNTER — Encounter: Payer: Self-pay | Admitting: Internal Medicine

## 2012-08-13 ENCOUNTER — Ambulatory Visit (INDEPENDENT_AMBULATORY_CARE_PROVIDER_SITE_OTHER): Payer: Commercial Managed Care - PPO | Admitting: Internal Medicine

## 2012-08-13 VITALS — BP 136/80 | HR 89 | Temp 98.3°F | Resp 12 | Ht 72.03 in | Wt 256.0 lb

## 2012-08-13 DIAGNOSIS — Z23 Encounter for immunization: Secondary | ICD-10-CM

## 2012-08-13 DIAGNOSIS — Z0000 Encounter for general adult medical examination without abnormal findings: Secondary | ICD-10-CM

## 2012-08-13 DIAGNOSIS — I10 Essential (primary) hypertension: Secondary | ICD-10-CM

## 2012-08-13 LAB — CBC WITH DIFFERENTIAL/PLATELET
Basophils Relative: 0.7 % (ref 0.0–3.0)
Eosinophils Absolute: 0.1 10*3/uL (ref 0.0–0.7)
Eosinophils Relative: 1.1 % (ref 0.0–5.0)
Lymphocytes Relative: 16.5 % (ref 12.0–46.0)
MCHC: 33.9 g/dL (ref 30.0–36.0)
MCV: 86.6 fl (ref 78.0–100.0)
Monocytes Absolute: 0.6 10*3/uL (ref 0.1–1.0)
Neutrophils Relative %: 74.4 % (ref 43.0–77.0)
Platelets: 209 10*3/uL (ref 150.0–400.0)
RBC: 5.81 Mil/uL (ref 4.22–5.81)
WBC: 8.3 10*3/uL (ref 4.5–10.5)

## 2012-08-13 LAB — BASIC METABOLIC PANEL
BUN: 17 mg/dL (ref 6–23)
Calcium: 9.2 mg/dL (ref 8.4–10.5)
Creatinine, Ser: 0.8 mg/dL (ref 0.4–1.5)

## 2012-08-13 LAB — LIPID PANEL
HDL: 37.9 mg/dL — ABNORMAL LOW (ref >39.00)
LDL Cholesterol: 122 mg/dL — ABNORMAL HIGH (ref 0–99)
Total CHOL/HDL Ratio: 5
Triglycerides: 133 mg/dL (ref 0.0–149.0)

## 2012-08-13 LAB — HEPATIC FUNCTION PANEL
Alkaline Phosphatase: 91 U/L (ref 39–117)
Bilirubin, Direct: 0.2 mg/dL (ref 0.0–0.3)
Total Bilirubin: 1.1 mg/dL (ref 0.3–1.2)
Total Protein: 7.1 g/dL (ref 6.0–8.3)

## 2012-08-13 LAB — TSH: TSH: 1.41 u[IU]/mL (ref 0.35–5.50)

## 2012-08-13 MED ORDER — LOSARTAN POTASSIUM 100 MG PO TABS: 100.0000 mg | ORAL_TABLET | Freq: Every day | ORAL | Status: DC

## 2012-08-13 NOTE — Progress Notes (Signed)
Subjective:    Patient ID: [name], male    DOB: 1946/03/14, 67 y.o.   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      Review of Systems HYPERTENSION: Disease Monitoring: Blood pressure  average <130/85 Compliant with present antihypertensive regimen   PMH of FASTING HYPERGLYCEMIA : Disease Monitoring: Blood Sugar:  no monitor  Medication Compliance: no diabetic meds Diet: heart healthy Exercise : walking   3 X / week for 1 mile Ophth exam current (Dr Lottie Dawson)  HYPERLIPIDEMIA: Diet & exercise as noted Medication Compliance:  Statin taken FH premature CAD / CVA : yes ,Mother MI @ 28 (updated & recorded in  FH)        Significant headache:no Chest pain, palpitations , claudications, PNDyspnea: no Exertional dyspnea:no Lightheadedness,Syncope: occasional positionally   Edema:no Polyuria/phagia/dipsia :no Numbness & tingling :no Non healing skin lesions: no     Blurred , double or loss of vision:no (OD glaucoma) Abd pain, bowel changes: no  Myalgias: no            Objective:   Physical Exam Gen.: Well-nourished in appearance. Alert, appropriate and cooperative throughout exam.   Head: Normocephalic without obvious abnormalities  Eyes: No corneal or conjunctival inflammation noted.  Extraocular motion intact.  Ears: External  ear exam reveals no significant lesions or deformities. Canals clear .TMs normal.  Nose: External nasal exam reveals no deformity or inflammation. Nasal mucosa are pink and moist. No lesions or exudates noted. Septum dislocated to R  Mouth: Oral mucosa and oropharynx reveal no lesions or exudates. Teeth in good repair. Neck: No deformities, masses, or tenderness noted. Range of motion &  Thyroid normal. Lungs: Normal respiratory effort; chest expands symmetrically. Lungs are clear to auscultation without rales, wheezes, or increased work of breathing. Heart: Normal rate and rhythm. Normal S1  and S2. No gallop, click, or rub. Grade 1/6 systolic murmur @ base. Abdomen: Bowel sounds normal; abdomen soft and nontender. No masses, organomegaly or hernias noted. Genitalia: I can not definitely verify testicular asymmetry on palpation .Prostate is not enlarged. There is a slight polypoid change in the left lobe in its midportion. There is no definite nodularity or induration.                            Musculoskeletal/extremities: There is slight asymmetry of the posterior thoracic musculature suggesting occult scoliosis. . No clubbing, cyanosis,or  edema. Range of motion decreased @ knees .Tone & strength  Normal.Flexion finger changes l hand.Fusiform changes of knees with crepitus. Nail health good. Able to lie down & sit up w/o help. Negative SLR bilaterally Vascular: Carotid, radial artery, dorsalis pedis and  posterior tibial pulses are full and equal. No bruits present. Neurologic: Alert and oriented x3. Deep tendon reflexes symmetrical and normal.  Skin: Intact without suspicious lesions or rashes. Lymph: No cervical, axillary, or inguinal lymphadenopathy present. Psych: Mood and affect are normal. Normally interactive  Assessment & Plan:  #1 comprehensive physical exam; no acute findings Plan: see Orders

## 2012-08-13 NOTE — Patient Instructions (Addendum)
Minimal Blood Pressure Goal= AVERAGE < 140/90;  Ideal is an AVERAGE < 135/85. This AVERAGE should be calculated from @ least 5-7 BP readings taken @ different times of day on different days of week. You should not respond to isolated BP readings , but rather the AVERAGE for that week .Please bring your  blood pressure cuff to office visits to verify that it is reliable.It  can also be checked against the blood pressure device at the pharmacy. Finger or wrist cuffs are not dependable; an arm cuff is.   To prevent palpitations or premature beats, avoid stimulants such as decongestants, diet pills, nicotine, or caffeine (coffee, tea, cola, or chocolate) to excess.     If you activate My Chart; the results can be released to you as soon as they populate from the lab. If you choose not to use this program; the labs have to be reviewed, copied & mailed   causing a delay in getting the results to you.

## 2012-08-16 ENCOUNTER — Ambulatory Visit: Payer: Commercial Managed Care - PPO

## 2012-08-16 DIAGNOSIS — R7309 Other abnormal glucose: Secondary | ICD-10-CM

## 2012-08-22 ENCOUNTER — Encounter: Payer: Self-pay | Admitting: Internal Medicine

## 2012-08-23 ENCOUNTER — Encounter: Payer: Self-pay | Admitting: Internal Medicine

## 2012-08-23 MED ORDER — SIMVASTATIN 20 MG PO TABS: 20.0000 mg | ORAL_TABLET | Freq: Every day | ORAL | Status: DC

## 2012-09-03 ENCOUNTER — Encounter: Payer: Self-pay | Admitting: Internal Medicine

## 2012-10-16 DIAGNOSIS — C61 Malignant neoplasm of prostate: Secondary | ICD-10-CM | POA: Insufficient documentation

## 2012-10-16 HISTORY — DX: Malignant neoplasm of prostate: C61

## 2012-11-19 ENCOUNTER — Other Ambulatory Visit (INDEPENDENT_AMBULATORY_CARE_PROVIDER_SITE_OTHER): Payer: Commercial Managed Care - PPO

## 2012-11-19 ENCOUNTER — Encounter: Payer: Self-pay | Admitting: Internal Medicine

## 2012-11-19 DIAGNOSIS — E785 Hyperlipidemia, unspecified: Secondary | ICD-10-CM

## 2012-11-19 DIAGNOSIS — T887XXA Unspecified adverse effect of drug or medicament, initial encounter: Secondary | ICD-10-CM

## 2012-11-19 LAB — LIPID PANEL
LDL Cholesterol: 96 mg/dL (ref 0–99)
Total CHOL/HDL Ratio: 4

## 2012-11-19 LAB — HEPATIC FUNCTION PANEL
Alkaline Phosphatase: 81 U/L (ref 39–117)
Bilirubin, Direct: 0.1 mg/dL (ref 0.0–0.3)

## 2012-11-20 ENCOUNTER — Encounter: Payer: Self-pay | Admitting: Internal Medicine

## 2012-11-20 MED ORDER — SIMVASTATIN 20 MG PO TABS: 20.0000 mg | ORAL_TABLET | Freq: Every day | ORAL | Status: DC

## 2012-11-20 NOTE — Telephone Encounter (Signed)
Med filled.  

## 2012-12-05 ENCOUNTER — Ambulatory Visit: Payer: Commercial Managed Care - PPO | Admitting: Radiation Oncology

## 2012-12-05 ENCOUNTER — Ambulatory Visit: Payer: Commercial Managed Care - PPO

## 2012-12-23 ENCOUNTER — Encounter: Payer: Self-pay | Admitting: Radiation Oncology

## 2012-12-23 NOTE — Progress Notes (Signed)
GU Location of Tumor / Histology:Prostate nodule at left medial base  If Prostate Cancer, Gleason Score is (3+4=7  ) and PSA is 4.95   Patient presented signs/symptoms ZO:[id number] PSA and questionable prostate abnormality Biopsies of 11/05/12 Prostaterevealed: Adenocarcinoma (1/12)cores positive, volume=47.3cc  Past/Anticipated interventions by urology, if any: History of surgery exploration of undescended Testis with abd/ exploration  Past/Anticipated interventions by medical oncology, if any:NO  Weight changes, if any: No  Bowel/Bladder complaints, if UJW:JXBJ 7  Nausea/Vomiting, if any: No Pain issues, if YNW:GNFAO back pain attributed to degenerative changes. SAFETY ISSUES:  Prior radiation? NO  Pacemaker/ICD? 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

## 2012-12-24 ENCOUNTER — Ambulatory Visit
Admission: RE | Admit: 2012-12-24 | Discharge: 2012-12-24 | Disposition: A | Discharge status: Home / Self Care | Payer: Commercial Managed Care - PPO | Source: Ambulatory Visit | Attending: Radiation Oncology | Admitting: Radiation Oncology

## 2012-12-24 ENCOUNTER — Encounter: Payer: Self-pay | Admitting: Radiation Oncology

## 2012-12-24 VITALS — BP 134/73 | HR 75 | Temp 98.2°F | Wt 255.5 lb

## 2012-12-24 DIAGNOSIS — C61 Malignant neoplasm of prostate: Secondary | ICD-10-CM

## 2012-12-24 DIAGNOSIS — K219 Gastro-esophageal reflux disease without esophagitis: Secondary | ICD-10-CM | POA: Insufficient documentation

## 2012-12-24 DIAGNOSIS — Z7982 Long term (current) use of aspirin: Secondary | ICD-10-CM | POA: Insufficient documentation

## 2012-12-24 DIAGNOSIS — I10 Essential (primary) hypertension: Secondary | ICD-10-CM | POA: Insufficient documentation

## 2012-12-24 DIAGNOSIS — N529 Male erectile dysfunction, unspecified: Secondary | ICD-10-CM | POA: Insufficient documentation

## 2012-12-24 DIAGNOSIS — E785 Hyperlipidemia, unspecified: Secondary | ICD-10-CM | POA: Insufficient documentation

## 2012-12-24 DIAGNOSIS — H409 Unspecified glaucoma: Secondary | ICD-10-CM | POA: Insufficient documentation

## 2012-12-24 HISTORY — DX: Male erectile dysfunction, unspecified: N52.9

## 2012-12-24 HISTORY — DX: Allergy, unspecified, initial encounter: T78.40XA

## 2012-12-24 HISTORY — DX: Malignant neoplasm of prostate: C61

## 2012-12-24 NOTE — Progress Notes (Signed)
[facility] Radiation Oncology NEW PATIENT EVALUATION  Name: [name] MRN: [id number]  Date:   12/24/2012           DOB: July 18, 1945  Status: outpatient   CC: Jack Melnick, MD  [name], MD    REFERRING PHYSICIAN: [name], MD   DIAGNOSIS: Stage T2a versus T1c intermediate risk adenocarcinoma prostate   HISTORY OF PRESENT ILLNESS:  [name] is a 68 y.o. male who is seen today for the courtesy Dr. [name] for discussion of possible radiation therapy in the management of his Stage T2a  versus T1c intermediate risk adenocarcinoma prostate. He was noted to have an elevated PSA of 8.2 from 08/05/2012. His PSA in February 2013 was 2.87. He was seen by Dr. [name] who performed ultrasound-guided biopsies on 11/05/2012. Dr. [name] palpated a small nodule along the medial left base.  He underwent ultrasound-guided biopsies on 11/05/2012 and was found have Gleason 7 (3+4) involving 80% of one core from the left apex. Remaining biopsies were benign. His gland volume was 47 cc. He is doing well from a GU and GI standpoint. His I PSS score is 7. He does have erectile dysfunction.  PREVIOUS RADIATION THERAPY: No   PAST MEDICAL HISTORY:  has a past medical history of Hypertension; Hyperlipidemia; Glaucoma; GERD (gastroesophageal reflux disease); Allergy; Prostate cancer (10/16/12); and Erectile dysfunction.     PAST SURGICAL HISTORY:  Past Surgical History  Procedure Laterality Date  . Total knee arthroplasty  07/2010    Dr Despina Hick  . Tonsillectomy      age 56 Dr.Yut  . Undescended testicle      age 4  . Colonoscopy  2009    Dr Arlyce Dice; due 2019     FAMILY HISTORY: family history includes Heart attack (age of onset: 29) in his mother; Osteoarthritis in his brother; and Stroke (age of onset: 22) in his father.  There is no history of Cancer and Diabetes. His father died of a heart attack at age 31 and his mother died following a stroke at 39. No family history  of prostate cancer.   SOCIAL HISTORY:  reports that he has never smoked. He has never used smokeless tobacco. He reports that he does not drink alcohol or use illicit drugs. Married, one child, 3 grandchildren. 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   ALLERGIES: Penicillins and Lisinopril   MEDICATIONS:  Current Outpatient Prescriptions  Medication Sig Dispense Refill  . aspirin EC 81 MG tablet Take 81 mg by mouth daily.      . fish oil-omega-3 fatty acids 1000 MG capsule Take 1 g by mouth daily.      Marland Kitchen losartan (COZAAR) 100 MG tablet Take 1 tablet (100 mg total) by mouth daily.  90 tablet  3  . simvastatin (ZOCOR) 20 MG tablet Take 1 tablet (20 mg total) by mouth at bedtime.  90 tablet  0  . timolol (BETIMOL) 0.5 % ophthalmic solution Place 1 drop into the right eye daily.         No current facility-administered medications for this encounter.     REVIEW OF SYSTEMS:  Pertinent items are noted in HPI.    PHYSICAL EXAM:  weight is 255 lb 8 oz (115.894 kg). His temperature is 98.2 F (36.8 C). His blood pressure is 134/73 and his pulse is 75.   Alert and oriented 67 year old white male appearing younger than his stated age. Head and neck examination: Grossly unremarkable. Nodes: Without  palpable cervical or supraclavicular lymphadenopathy. Chest: Lungs clear. Back: Without spinal or CVA tenderness. Heart: Regular rate and rhythm. Abdomen: Without hepatomegaly genitalia: Unremarkable to inspection. Rectal: The prostate gland is slightly enlarged and there is a 5 mm nodule palpable along the left medial base. I do not palpate nodularity along the left apical region. Extremities: Without edema. Neurologic examination: Grossly nonfocal   LABORATORY DATA:  Lab Results  Component Value Date   WBC 8.3 08/13/2012   HGB 17.1* 08/13/2012   HCT 50.3 08/13/2012   MCV 86.6 08/13/2012   PLT 209.0 08/13/2012   Lab Results  Component Value Date   NA 136 08/13/2012   K 4.0 08/13/2012   CL  104 08/13/2012   CO2 25 08/13/2012   Lab Results  Component Value Date   ALT 18 11/19/2012   AST 16 11/19/2012   ALKPHOS 81 11/19/2012   BILITOT 0.9 11/19/2012      IMPRESSION: Stage T1c versus T2a intermediate risk adenocarcinoma prostate. I explained to the patient that his prognosis is related to his stage, PSA level, and Gleason score. His stage and PSA level are favorable while his Gleason score of 7 is of intermediate favorability. Other prognostic factors include PSA doubling time and also disease volume. His prognosis appears to be favorable. We discussed treatment options which include surgery versus close surveillance versus radiation therapy. Considering his Gleason score 7, young age, and good performance status, I do not favor close surveillance. Radiation therapy options include seed implantation alone or 8 weeks of external beam/IMRT. We discussed the potential acute and late toxicities of radiation therapy. We talked about the need for a CT arch study to assess his prostate volume to make sure that there is no pubic arch interference if he is interested in prostate seed implantation. We talked that radiation safety issues with seed implantation. After lengthy discussion he is undecided.  I told him that if he is undecided that he should lean towards surgery considering his excellent performance status given her radiation therapy can be expected to give a similar cure rate. He also understands that there may be a role for post prostatectomy radiation therapy should he have a positive apical margin/ pathologic stage T3 disease.  PLAN: As discussed above. He'll think things over and get back in touch with Dr. [name] or me regarding his choice of therapy.  I spent 60 minutes minutes face to face with the patient and more than 50% of that time was spent in counseling and/or coordination of care.

## 2012-12-24 NOTE — Progress Notes (Signed)
Please see the Nurse Progress Note in the MD Initial Consult Encounter for this patient. 

## 2013-01-28 ENCOUNTER — Other Ambulatory Visit: Payer: Self-pay | Admitting: Urology

## 2013-01-28 DIAGNOSIS — C61 Malignant neoplasm of prostate: Secondary | ICD-10-CM | POA: Diagnosis not present

## 2013-01-29 ENCOUNTER — Other Ambulatory Visit: Payer: Self-pay | Admitting: Urology

## 2013-02-03 DIAGNOSIS — S0590XA Unspecified injury of unspecified eye and orbit, initial encounter: Secondary | ICD-10-CM | POA: Diagnosis not present

## 2013-02-03 DIAGNOSIS — H2510 Age-related nuclear cataract, unspecified eye: Secondary | ICD-10-CM | POA: Diagnosis not present

## 2013-02-03 DIAGNOSIS — H4030X0 Glaucoma secondary to eye trauma, unspecified eye, stage unspecified: Secondary | ICD-10-CM | POA: Diagnosis not present

## 2013-02-04 DIAGNOSIS — C61 Malignant neoplasm of prostate: Secondary | ICD-10-CM | POA: Diagnosis not present

## 2013-02-04 DIAGNOSIS — R279 Unspecified lack of coordination: Secondary | ICD-10-CM | POA: Diagnosis not present

## 2013-02-04 DIAGNOSIS — N393 Stress incontinence (female) (male): Secondary | ICD-10-CM | POA: Diagnosis not present

## 2013-02-04 DIAGNOSIS — M6281 Muscle weakness (generalized): Secondary | ICD-10-CM | POA: Diagnosis not present

## 2013-02-11 DIAGNOSIS — R279 Unspecified lack of coordination: Secondary | ICD-10-CM | POA: Diagnosis not present

## 2013-02-11 DIAGNOSIS — M6281 Muscle weakness (generalized): Secondary | ICD-10-CM | POA: Diagnosis not present

## 2013-02-14 ENCOUNTER — Encounter (HOSPITAL_COMMUNITY): Payer: Self-pay | Admitting: Pharmacy Technician

## 2013-02-19 NOTE — Patient Instructions (Signed)
[name]  02/19/2013   Your procedure is scheduled on:  02/27/13              Surgery 1100am-200pm  Report to [facility] at     0830 AM.  Call this number if you have problems the morning of surgery: [phone]   Remember:   Do not eat food or drink liquids after midnight.   Take these medicines the morning of surgery with A SIP OF WATER:    Do not wear jewelry, make-up or nail polish.  Do not wear lotions, powders, or perfumes.   . Men may shave face and neck.  Do not bring valuables to the hospital.  Contacts, dentures or bridgework may not be worn into surgery.  Leave suitcase in the car. After surgery it may be brought to your room.  For patients admitted to the hospital, checkout time is 11:00 AM the day of  discharge.      SEE CHG INSTRUCTION SHEET    Please read over the following fact sheets that you were given: , coughing and deep breathing exercises, leg exercises , Blood Transfusion Fact sheet, Incentive Spirometry Fact sheet               Failure to comply with these instructions may result in cancellation of your surgery.                Patient Signature ____________________________              Nurse Signature _____________________________

## 2013-02-20 ENCOUNTER — Encounter (HOSPITAL_COMMUNITY): Payer: Self-pay

## 2013-02-20 ENCOUNTER — Encounter (HOSPITAL_COMMUNITY)
Admission: RE | Admit: 2013-02-20 | Discharge: 2013-02-20 | Disposition: A | Discharge status: Home / Self Care | Payer: Medicare Other | Source: Ambulatory Visit | Attending: Urology | Admitting: Urology

## 2013-02-20 ENCOUNTER — Ambulatory Visit (HOSPITAL_COMMUNITY)
Admission: RE | Admit: 2013-02-20 | Discharge: 2013-02-20 | Disposition: A | Discharge status: Home / Self Care | Payer: Medicare Other | Source: Ambulatory Visit | Attending: Urology | Admitting: Urology

## 2013-02-20 DIAGNOSIS — Z01812 Encounter for preprocedural laboratory examination: Secondary | ICD-10-CM | POA: Insufficient documentation

## 2013-02-20 DIAGNOSIS — Z01818 Encounter for other preprocedural examination: Secondary | ICD-10-CM | POA: Diagnosis not present

## 2013-02-20 DIAGNOSIS — Z0183 Encounter for blood typing: Secondary | ICD-10-CM | POA: Insufficient documentation

## 2013-02-20 DIAGNOSIS — I10 Essential (primary) hypertension: Secondary | ICD-10-CM | POA: Diagnosis not present

## 2013-02-20 HISTORY — DX: Unspecified osteoarthritis, unspecified site: M19.90

## 2013-02-20 HISTORY — DX: Cardiac murmur, unspecified: R01.1

## 2013-02-20 LAB — CBC
HCT: 47.9 % (ref 39.0–52.0)
Hemoglobin: 16.7 g/dL (ref 13.0–17.0)
MCV: 85.1 fL (ref 78.0–100.0)
WBC: 9.3 10*3/uL (ref 4.0–10.5)

## 2013-02-20 LAB — BASIC METABOLIC PANEL
CO2: 25 mEq/L (ref 19–32)
Chloride: 103 mEq/L (ref 96–112)
Creatinine, Ser: 0.74 mg/dL (ref 0.50–1.35)
Potassium: 4.5 mEq/L (ref 3.5–5.1)

## 2013-02-26 NOTE — H&P (Signed)
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  He underwent a prostate biopsy under TRUS guidance on 11/05/12 which confirmed Gleason 3+4=7 adenocarcinoma in 1 out of 12 biopsy cores. He has no family history of prostate cancer. His comorbidities include hypertension, hyperlipidemia, and GERD. He has been thoroughly counseled by myself and Dr. Dayton Scrape regarding his treatment options.  TNM stage: cT2a Nx Mx PSA: 4.95 Gleason score: 3+4=7 Biopsy (11/05/12): 1/12 cores positive -- L apex (80%, 3+4=7) Prostate volume: 47.3 cc  Nomogram OC disease: 71% EPE: 31% SVI: 2% LNI: 2.3% PFS (surgery): 96% at 5 years, 94% at 10 years CSS: 99% at 5 and 10 years  Urinary function:He has urinary urgency and nocturia. IPSS is 10. Erectile function: He has moderate erectile dysfunction.  He and his wife have not been sexually active over the past 6 months and erectile function is not a major priority for him. SHIM score is 3.  Interval history:  He follows up today for further discussion regarding his treatment options for his localized prostate cancer.  He has had a chance to meet with Dr. Dayton Scrape and discuss his radiation therapy options.  After discussing his options with his wife, he has elected to proceed with surgical therapy and is here to discuss that in further detail.  He denies any changes in his overall medical health since his last visit.     Past Medical History Problems  1. History of  Esophageal Reflux 530.81 2. History of  Glaucoma 365.9 3. History of  Hyperlipidemia 272.4 4. History of  Hypertension 401.9  Surgical History Problems  1. History of  Complete Colonoscopy 2. History of  Surgery Exploration Of Undescended Testis W/ Abd Exploration 3. History of  Tonsillectomy With Adenoidectomy 4. History of  Total Knee Arthroplasty  Current Meds 1. Aspirin 81 MG Oral  Tablet; Therapy: (Recorded:09Apr2014) to 2. Cozaar 100 MG Oral Tablet; Therapy: (Recorded:08Apr2014) to 3. Lotensin 20 MG Oral Tablet; Therapy: (Recorded:08Apr2014) to 4. Motrin TABS; Therapy: (Recorded:09Apr2014) to  Allergies Medication  1. Lisinopril TABS 2. Penicillins  Family History Problems  1. Family history of  Acute Myocardial Infarction V17.3 2. Family history of  Cerebral Vasculitis  Social History Problems  1. Marital History - Currently Married 2. Never A Smoker Denied  3. History of  Alcohol Use  Vitals Vital Signs [Data Includes: Last 1 Day]  15Jul2014 08:23AM  Blood Pressure: 146 / 88 Temperature: 98.1 F Heart Rate: 66  Physical Exam Constitutional: Well nourished and well developed . No acute distress.  ENT:. The ears and nose are normal in appearance.  Neck: The appearance of the neck is normal and no neck mass is present.  Pulmonary: No respiratory distress, normal respiratory rhythm and effort and clear bilateral breath sounds.  Cardiovascular: Heart rate and rhythm are normal . No peripheral edema.  Abdomen: The abdomen is mildly obese. The abdomen is soft and nontender. No masses are palpated. No CVA tenderness. No hernias are palpable. No hepatosplenomegaly noted.  Skin: Normal skin turgor, no visible rash and no visible skin lesions.  Neuro/Psych:. Mood and affect are appropriate.    Results/Data  I reviewed Dr. Rennie Plowman notes today.     Assessment Assessed  1. Prostate Cancer 185  Plan Prostate Cancer (185)  1. Follow-up Schedule Surgery Office  Follow-up  Done: 15Jul2014 2. PT/OT Referral Referral  Referral  Requested for: 22Jul2014  Discussion/Summary  1.  Prostate cancer: We reviewed some of the advantages and disadvantages of radiation therapy and surgical treatment for prostate cancer today.  Questions were answered to his stated satisfaction and we specifically discussed issues related to cancer control, urinary control, erectile  function, and bowel function with both surgical and radiation therapy for prostate cancer.  He would like to go forward with surgical treatment of his prostate cancer.   We discussed surgical therapy for prostate cancer including the different available surgical approaches. We discussed, in detail, the risks and expectations of surgery with regard to cancer control, urinary control, and erectile function as well as the expected postoperative recovery process. Additional risks of surgery including but not limited to bleeding, infection, hernia formation, nerve damage, lymphocele formation, bowel/rectal injury potentially necessitating colostomy, damage to the urinary tract resulting in urine leakage, urethral stricture, and the cardiopulmonary risks such as myocardial infarction, stroke, death, venothromboembolism, etc. were explained. The risk of open surgical conversion for robotic/laparoscopic prostatectomy was also discussed.   He would like to schedule surgery and will be scheduled for a bilateral nerve sparing robotic-assisted laparoscopic radical prostatectomy and pelvic lymphadenectomy in the near future.  Cc: Dr. [name]  A total of 45 minutes were spent in the overall care of the patient today with minutes in direct face to face consultation.    Signatures Electronically signed by : Heloise Purpura, M.D.; Jan 28 2013 10:28AM

## 2013-02-27 ENCOUNTER — Ambulatory Visit (HOSPITAL_COMMUNITY): Payer: Medicare Other | Admitting: Anesthesiology

## 2013-02-27 ENCOUNTER — Encounter (HOSPITAL_COMMUNITY)
Admission: RE | Disposition: A | Discharge status: Home / Self Care | Payer: Self-pay | Source: Ambulatory Visit | Attending: Urology

## 2013-02-27 ENCOUNTER — Observation Stay (HOSPITAL_COMMUNITY)
Admission: RE | Admit: 2013-02-27 | Discharge: 2013-02-28 | Disposition: A | Discharge status: Home / Self Care | Payer: Medicare Other | Source: Ambulatory Visit | Attending: Urology | Admitting: Urology

## 2013-02-27 ENCOUNTER — Encounter (HOSPITAL_COMMUNITY): Payer: Self-pay | Admitting: Anesthesiology

## 2013-02-27 ENCOUNTER — Encounter (HOSPITAL_COMMUNITY): Payer: Self-pay | Admitting: *Deleted

## 2013-02-27 DIAGNOSIS — K219 Gastro-esophageal reflux disease without esophagitis: Secondary | ICD-10-CM | POA: Insufficient documentation

## 2013-02-27 DIAGNOSIS — I10 Essential (primary) hypertension: Secondary | ICD-10-CM | POA: Diagnosis not present

## 2013-02-27 DIAGNOSIS — E785 Hyperlipidemia, unspecified: Secondary | ICD-10-CM | POA: Insufficient documentation

## 2013-02-27 DIAGNOSIS — C61 Malignant neoplasm of prostate: Secondary | ICD-10-CM | POA: Diagnosis not present

## 2013-02-27 DIAGNOSIS — Z7982 Long term (current) use of aspirin: Secondary | ICD-10-CM | POA: Insufficient documentation

## 2013-02-27 DIAGNOSIS — Z79899 Other long term (current) drug therapy: Secondary | ICD-10-CM | POA: Insufficient documentation

## 2013-02-27 HISTORY — PX: LYMPHADENECTOMY: SHX5960

## 2013-02-27 HISTORY — PX: ROBOT ASSISTED LAPAROSCOPIC RADICAL PROSTATECTOMY: SHX5141

## 2013-02-27 LAB — TYPE AND SCREEN: Antibody Screen: NEGATIVE

## 2013-02-27 SURGERY — ROBOTIC ASSISTED LAPAROSCOPIC RADICAL PROSTATECTOMY LEVEL 2
Anesthesia: General | Wound class: Clean Contaminated

## 2013-02-27 MED ORDER — GLYCOPYRROLATE 0.2 MG/ML IJ SOLN
INTRAMUSCULAR | Status: DC | PRN
  Administered 2013-02-27: 0.6 mg via INTRAVENOUS

## 2013-02-27 MED ORDER — SIMVASTATIN 20 MG PO TABS
20.0000 mg | ORAL_TABLET | Freq: Every day | ORAL | Status: DC
  Administered 2013-02-27: 20 mg via ORAL
  Filled 2013-02-27 (×2): qty 1

## 2013-02-27 MED ORDER — SUCCINYLCHOLINE CHLORIDE 20 MG/ML IJ SOLN
INTRAMUSCULAR | Status: DC | PRN
  Administered 2013-02-27: 100 mg via INTRAVENOUS

## 2013-02-27 MED ORDER — TIMOLOL MALEATE 0.5 % OP SOLN
1.0000 [drp] | Freq: Every day | OPHTHALMIC | Status: DC
  Administered 2013-02-28: 1 [drp] via OPHTHALMIC
  Filled 2013-02-27: qty 5

## 2013-02-27 MED ORDER — INDIGOTINDISULFONATE SODIUM 8 MG/ML IJ SOLN
INTRAMUSCULAR | Status: DC | PRN
  Administered 2013-02-27 (×2): 5 mL via INTRAVENOUS

## 2013-02-27 MED ORDER — ACETAMINOPHEN 325 MG PO TABS: 650.0000 mg | ORAL_TABLET | ORAL | Status: DC | PRN

## 2013-02-27 MED ORDER — PROMETHAZINE HCL 25 MG/ML IJ SOLN: 6.2500 mg | INTRAMUSCULAR | Status: DC | PRN

## 2013-02-27 MED ORDER — SODIUM CHLORIDE 0.9 % IV BOLUS (SEPSIS)
1000.0000 mL | Freq: Once | INTRAVENOUS | Status: AC
  Administered 2013-02-27: 1000 mL via INTRAVENOUS

## 2013-02-27 MED ORDER — MORPHINE SULFATE 2 MG/ML IJ SOLN
2.0000 mg | INTRAMUSCULAR | Status: DC | PRN
Start: 2013-02-27 — End: 2013-02-28

## 2013-02-27 MED ORDER — ROCURONIUM BROMIDE 100 MG/10ML IV SOLN
INTRAVENOUS | Status: DC | PRN
  Administered 2013-02-27: 20 mg via INTRAVENOUS
  Administered 2013-02-27: 10 mg via INTRAVENOUS
  Administered 2013-02-27: 45 mg via INTRAVENOUS
  Administered 2013-02-27: 5 mg via INTRAVENOUS

## 2013-02-27 MED ORDER — DOCUSATE SODIUM 100 MG PO CAPS
100.0000 mg | ORAL_CAPSULE | Freq: Two times a day (BID) | ORAL | Status: DC
  Administered 2013-02-27 – 2013-02-28 (×2): 100 mg via ORAL
  Filled 2013-02-27 (×3): qty 1

## 2013-02-27 MED ORDER — DIPHENHYDRAMINE HCL 12.5 MG/5ML PO ELIX: 12.5000 mg | ORAL_SOLUTION | Freq: Four times a day (QID) | ORAL | Status: DC | PRN

## 2013-02-27 MED ORDER — LABETALOL HCL 5 MG/ML IV SOLN
INTRAVENOUS | Status: DC | PRN
  Administered 2013-02-27 (×2): 5 mg via INTRAVENOUS

## 2013-02-27 MED ORDER — STERILE WATER FOR IRRIGATION IR SOLN
Status: DC | PRN
  Administered 2013-02-27: 3000 mL

## 2013-02-27 MED ORDER — BUPIVACAINE-EPINEPHRINE 0.25% -1:200000 IJ SOLN
INTRAMUSCULAR | Status: DC | PRN
  Administered 2013-02-27: 30 mL

## 2013-02-27 MED ORDER — BUPIVACAINE-EPINEPHRINE PF 0.25-1:200000 % IJ SOLN
INTRAMUSCULAR | Status: AC
  Filled 2013-02-27: qty 30

## 2013-02-27 MED ORDER — SODIUM CHLORIDE 0.9 % IR SOLN
Status: DC | PRN
  Administered 2013-02-27: 1000 mL via INTRAVESICAL

## 2013-02-27 MED ORDER — LIDOCAINE HCL (CARDIAC) 20 MG/ML IV SOLN
INTRAVENOUS | Status: DC | PRN
  Administered 2013-02-27: 100 mg via INTRAVENOUS

## 2013-02-27 MED ORDER — KCL IN DEXTROSE-NACL 20-5-0.45 MEQ/L-%-% IV SOLN
INTRAVENOUS | Status: DC
  Administered 2013-02-27 – 2013-02-28 (×2): via INTRAVENOUS
  Filled 2013-02-27 (×5): qty 1000

## 2013-02-27 MED ORDER — HYDROMORPHONE HCL PF 1 MG/ML IJ SOLN: 0.2500 mg | INTRAMUSCULAR | Status: DC | PRN

## 2013-02-27 MED ORDER — KCL IN DEXTROSE-NACL 20-5-0.45 MEQ/L-%-% IV SOLN
INTRAVENOUS | Status: AC
  Filled 2013-02-27: qty 1000

## 2013-02-27 MED ORDER — HEPARIN SODIUM (PORCINE) 1000 UNIT/ML IJ SOLN
INTRAMUSCULAR | Status: AC
  Filled 2013-02-27: qty 1

## 2013-02-27 MED ORDER — VANCOMYCIN HCL 10 G IV SOLR
1500.0000 mg | INTRAVENOUS | Status: AC
  Administered 2013-02-27: 1500 mg via INTRAVENOUS
  Filled 2013-02-27: qty 1500

## 2013-02-27 MED ORDER — HYDROMORPHONE HCL PF 1 MG/ML IJ SOLN
INTRAMUSCULAR | Status: DC | PRN
  Administered 2013-02-27 (×2): 1 mg via INTRAVENOUS

## 2013-02-27 MED ORDER — ESMOLOL HCL 10 MG/ML IV SOLN
INTRAVENOUS | Status: DC | PRN
  Administered 2013-02-27: 20 mg via INTRAVENOUS

## 2013-02-27 MED ORDER — MIDAZOLAM HCL 5 MG/5ML IJ SOLN
INTRAMUSCULAR | Status: DC | PRN
  Administered 2013-02-27: 2 mg via INTRAVENOUS

## 2013-02-27 MED ORDER — VANCOMYCIN HCL IN DEXTROSE 1-5 GM/200ML-% IV SOLN
1000.0000 mg | Freq: Two times a day (BID) | INTRAVENOUS | Status: AC
  Administered 2013-02-27: 1000 mg via INTRAVENOUS
  Filled 2013-02-27: qty 200

## 2013-02-27 MED ORDER — INDIGOTINDISULFONATE SODIUM 8 MG/ML IJ SOLN
INTRAMUSCULAR | Status: AC
  Filled 2013-02-27: qty 10

## 2013-02-27 MED ORDER — PROPOFOL 10 MG/ML IV BOLUS
INTRAVENOUS | Status: DC | PRN
  Administered 2013-02-27: 200 mg via INTRAVENOUS

## 2013-02-27 MED ORDER — NEOSTIGMINE METHYLSULFATE 1 MG/ML IJ SOLN
INTRAMUSCULAR | Status: DC | PRN
  Administered 2013-02-27: 4 mg via INTRAVENOUS

## 2013-02-27 MED ORDER — LACTATED RINGERS IV SOLN
INTRAVENOUS | Status: DC
  Administered 2013-02-27 (×2): via INTRAVENOUS
  Administered 2013-02-27: 1000 mL via INTRAVENOUS

## 2013-02-27 MED ORDER — ONDANSETRON HCL 4 MG/2ML IJ SOLN
INTRAMUSCULAR | Status: DC | PRN
  Administered 2013-02-27: 4 mg via INTRAVENOUS

## 2013-02-27 MED ORDER — TIMOLOL HEMIHYDRATE 0.5 % OP SOLN: 1.0000 [drp] | Freq: Every morning | OPHTHALMIC | Status: DC

## 2013-02-27 MED ORDER — KETOROLAC TROMETHAMINE 15 MG/ML IJ SOLN
15.0000 mg | Freq: Four times a day (QID) | INTRAMUSCULAR | Status: DC
  Administered 2013-02-27 – 2013-02-28 (×4): 15 mg via INTRAVENOUS
  Filled 2013-02-27 (×6): qty 1

## 2013-02-27 MED ORDER — FENTANYL CITRATE 0.05 MG/ML IJ SOLN
INTRAMUSCULAR | Status: DC | PRN
  Administered 2013-02-27 (×2): 100 ug via INTRAVENOUS
  Administered 2013-02-27: 50 ug via INTRAVENOUS

## 2013-02-27 MED ORDER — HYDROCODONE-ACETAMINOPHEN 5-325 MG PO TABS: 1.0000 | ORAL_TABLET | Freq: Four times a day (QID) | ORAL | Status: DC | PRN

## 2013-02-27 MED ORDER — CIPROFLOXACIN HCL 500 MG PO TABS: 500.0000 mg | ORAL_TABLET | Freq: Two times a day (BID) | ORAL | Status: DC

## 2013-02-27 MED ORDER — LACTATED RINGERS IV SOLN
INTRAVENOUS | Status: DC | PRN
  Administered 2013-02-27: 12:00:00

## 2013-02-27 MED ORDER — LABETALOL HCL 5 MG/ML IV SOLN
10.0000 mg | INTRAVENOUS | Status: DC | PRN
  Administered 2013-02-27: 10 mg via INTRAVENOUS

## 2013-02-27 MED ORDER — DIPHENHYDRAMINE HCL 50 MG/ML IJ SOLN: 12.5000 mg | Freq: Four times a day (QID) | INTRAMUSCULAR | Status: DC | PRN

## 2013-02-27 SURGICAL SUPPLY — 43 items
CANISTER SUCTION 2500CC (MISCELLANEOUS) ×3 IMPLANT
CATH FOLEY 2WAY SLVR 18FR 30CC (CATHETERS) ×3 IMPLANT
CATH ROBINSON RED A/P 16FR (CATHETERS) ×3 IMPLANT
CATH ROBINSON RED A/P 8FR (CATHETERS) ×3 IMPLANT
CATH TIEMANN FOLEY 18FR 5CC (CATHETERS) ×3 IMPLANT
CHLORAPREP W/TINT 26ML (MISCELLANEOUS) ×3 IMPLANT
CLIP LIGATING HEM O LOK PURPLE (MISCELLANEOUS) ×7 IMPLANT
CLOTH BEACON ORANGE TIMEOUT ST (SAFETY) ×3 IMPLANT
CORD HIGH FREQUENCY UNIPOLAR (ELECTROSURGICAL) ×3 IMPLANT
COVER SURGICAL LIGHT HANDLE (MISCELLANEOUS) ×3 IMPLANT
COVER TIP SHEARS 8 DVNC (MISCELLANEOUS) ×2 IMPLANT
COVER TIP SHEARS 8MM DA VINCI (MISCELLANEOUS) ×1
CUTTER ECHEON FLEX ENDO 45 340 (ENDOMECHANICALS) ×3 IMPLANT
DECANTER SPIKE VIAL GLASS SM (MISCELLANEOUS) ×2 IMPLANT
DRAPE SURG IRRIG POUCH 19X23 (DRAPES) ×3 IMPLANT
DRSG TEGADERM 2-3/8X2-3/4 SM (GAUZE/BANDAGES/DRESSINGS) ×12 IMPLANT
DRSG TEGADERM 4X4.75 (GAUZE/BANDAGES/DRESSINGS) ×6 IMPLANT
DRSG TEGADERM 6X8 (GAUZE/BANDAGES/DRESSINGS) ×6 IMPLANT
ELECT REM PT RETURN 9FT ADLT (ELECTROSURGICAL) ×3
ELECTRODE REM PT RTRN 9FT ADLT (ELECTROSURGICAL) ×2 IMPLANT
GAUZE SPONGE 2X2 8PLY STRL LF (GAUZE/BANDAGES/DRESSINGS) ×2 IMPLANT
GLOVE BIO SURGEON STRL SZ 6.5 (GLOVE) ×3 IMPLANT
GLOVE BIOGEL M STRL SZ7.5 (GLOVE) ×6 IMPLANT
GOWN STRL NON-REIN LRG LVL3 (GOWN DISPOSABLE) ×9 IMPLANT
GOWN STRL REIN XL XLG (GOWN DISPOSABLE) ×6 IMPLANT
HOLDER FOLEY CATH W/STRAP (MISCELLANEOUS) ×3 IMPLANT
IV LACTATED RINGERS 1000ML (IV SOLUTION) ×2 IMPLANT
KIT ACCESSORY DA VINCI DISP (KITS) ×1
KIT ACCESSORY DVNC DISP (KITS) ×2 IMPLANT
NDL SAFETY ECLIPSE 18X1.5 (NEEDLE) ×2 IMPLANT
NEEDLE HYPO 18GX1.5 SHARP (NEEDLE) ×3
PACK ROBOT UROLOGY CUSTOM (CUSTOM PROCEDURE TRAY) ×3 IMPLANT
RELOAD GREEN ECHELON 45 (STAPLE) ×3 IMPLANT
SET TUBE IRRIG SUCTION NO TIP (IRRIGATION / IRRIGATOR) ×3 IMPLANT
SOLUTION ELECTROLUBE (MISCELLANEOUS) ×3 IMPLANT
SPONGE GAUZE 2X2 STER 10/PKG (GAUZE/BANDAGES/DRESSINGS) ×1
SUT ETHILON 3 0 PS 1 (SUTURE) ×3 IMPLANT
SUT VICRYL 0 UR6 27IN ABS (SUTURE) ×6 IMPLANT
SYR 27GX1/2 1ML LL SAFETY (SYRINGE) ×3 IMPLANT
TOWEL OR 17X26 10 PK STRL BLUE (TOWEL DISPOSABLE) ×3 IMPLANT
TOWEL OR NON WOVEN STRL DISP B (DISPOSABLE) ×3 IMPLANT
TROCAR 12M 150ML BLUNT (TROCAR) ×1 IMPLANT
WATER STERILE IRR 1500ML POUR (IV SOLUTION) ×6 IMPLANT

## 2013-02-27 NOTE — Interval H&P Note (Signed)
History and Physical Interval Note:  02/27/2013 11:34 AM  [name]  has presented today for surgery, with the diagnosis of PROSTATE CANCER  The various methods of treatment have been discussed with the patient and family. After consideration of risks, benefits and other options for treatment, the patient has consented to  Procedure(s): ROBOTIC ASSISTED LAPAROSCOPIC RADICAL PROSTATECTOMY LEVEL 2 (N/A) LYMPHADENECTOMY (Bilateral) as a surgical intervention .  The patient's history has been reviewed, patient examined, no change in status, stable for surgery.  I have reviewed the patient's chart and labs.  Questions were answered to the patient's satisfaction.     Frank Pilger,LES

## 2013-02-27 NOTE — Op Note (Signed)
Preoperative diagnosis: Clinically localized adenocarcinoma of the prostate (clinical stage T2a Nx Mx)  Postoperative diagnosis: Clinically localized adenocarcinoma of the prostate (clinical stage T2a Nx Mx)  Procedure:  1. Robotic assisted laparoscopic radical prostatectomy (bilateral nerve sparing) 2. Bilateral robotic assisted laparoscopic pelvic lymphadenectomy  Surgeon: [name]. M.D.  Assistant: Pecola Leisure, PA-C  Anesthesia: General  Complications: None  EBL: 100 mL  IVF:  1600 mL crystalloid  Specimens: 1. Prostate and seminal vesicles 2. Right pelvic lymph nodes 3. Left pelvic lymph nodes  Disposition of specimens: Pathology  Drains: 1. 20 Fr coude catheter 2. # 19 Blake pelvic drain  Indication: [name] is a 67 y.o. year old patient with clinically localized prostate cancer.  After a thorough review of the management options for treatment of prostate cancer, he elected to proceed with surgical therapy and the above procedure(s).  We have discussed the potential benefits and risks of the procedure, side effects of the proposed treatment, the likelihood of the patient achieving the goals of the procedure, and any potential problems that might occur during the procedure or recuperation. Informed consent has been obtained.  Description of procedure:  The patient was taken to the operating room and a general anesthetic was administered. He was given preoperative antibiotics, placed in the dorsal lithotomy position, and prepped and draped in the usual sterile fashion. Next a preoperative timeout was performed. A urethral catheter was placed into the bladder and a site was selected near the umbilicus for placement of the camera port. This was placed using a standard open Hassan technique which allowed entry into the peritoneal cavity under direct vision and without difficulty. A 12 mm port was placed and a pneumoperitoneum established. The camera was  then used to inspect the abdomen and there was no evidence of any intra-abdominal injuries or other abnormalities. The remaining abdominal ports were then placed. 8 mm robotic ports were placed in the right lower quadrant, left lower quadrant, and far left lateral abdominal wall. A 5 mm port was placed in the right upper quadrant and a 12 mm port was placed in the right lateral abdominal wall for laparoscopic assistance. All ports were placed under direct vision without difficulty. The surgical cart was then docked.   Utilizing the cautery scissors, the bladder was reflected posteriorly allowing entry into the space of Retzius and identification of the endopelvic fascia and prostate. The periprostatic fat was then removed from the prostate allowing full exposure of the endopelvic fascia. The endopelvic fascia was then incised from the apex back to the base of the prostate bilaterally and the underlying levator muscle fibers were swept laterally off the prostate thereby isolating the dorsal venous complex. The dorsal vein was then stapled and divided with a 45 mm Flex Echelon stapler. Attention then turned to the bladder neck which was divided anteriorly thereby allowing entry into the bladder and exposure of the urethral catheter. The catheter balloon was deflated and the catheter was brought into the operative field and used to retract the prostate anteriorly. The posterior bladder neck was then examined and was divided allowing further dissection between the bladder and prostate posteriorly until the vasa deferentia and seminal vessels were identified. The vasa deferentia were isolated, divided, and lifted anteriorly. The seminal vesicles were dissected down to their tips with care to control the seminal vascular arterial blood supply. These structures were then lifted anteriorly and the space between Denonvillier's fascia and the anterior rectum was developed with a combination  of sharp and blunt dissection.  This isolated the vascular pedicles of the prostate.  The lateral prostatic fascia was then sharply incised allowing release of the neurovascular bundles bilaterally. The vascular pedicles of the prostate were then ligated with Weck clips between the prostate and neurovascular bundles and divided with sharp cold scissor dissection resulting in neurovascular bundle preservation. The neurovascular bundles were then separated off the apex of the prostate and urethra bilaterally.  The urethra was then sharply transected allowing the prostate specimen to be disarticulated. The pelvis was copiously irrigated and hemostasis was ensured. There was no evidence for rectal injury.  Attention then turned to the right pelvic sidewall. The fibrofatty tissue between the external iliac vein, confluence of the iliac vessels, hypogastric artery, and Cooper's ligament was dissected free from the pelvic sidewall with care to preserve the obturator nerve. Weck clips were used for lymphostasis and hemostasis. An identical procedure was performed on the contralateral side and the lymphatic packets were removed for permanent pathologic analysis.  Attention then turned to the urethral anastomosis. A 2-0 Vicryl slip knot was placed between Denonvillier's fascia, the posterior bladder neck, and the posterior urethra to reapproximate these structures. A double-armed 3-0 Monocryl suture was then used to perform a 360 running tension-free anastomosis between the bladder neck and urethra. A new urethral catheter was then placed into the bladder and irrigated. There were no blood clots within the bladder and the anastomosis appeared to be watertight. A #19 Blake drain was then brought through the left lateral 8 mm port site and positioned appropriately within the pelvis. It was secured to the skin with a nylon suture. The surgical cart was then undocked. The right lateral 12 mm port site was closed at the fascial level with a 0 Vicryl  suture placed laparoscopically. All remaining ports were then removed under direct vision. The prostate specimen was removed intact within the Endopouch retrieval bag via the periumbilical camera port site. This fascial opening was closed with two running 0 Vicryl sutures. 0.25% Marcaine was then injected into all port sites and all incisions were reapproximated at the skin level with staples. Sterile dressings were applied. The patient appeared to tolerate the procedure well and without complications. The patient was able to be extubated and transferred to the recovery unit in satisfactory condition.   [name] MD

## 2013-02-27 NOTE — Anesthesia Postprocedure Evaluation (Signed)
  Anesthesia Post-op Note  Patient: [name]  Procedure(s) Performed: Procedure(s) (LRB): ROBOTIC ASSISTED LAPAROSCOPIC RADICAL PROSTATECTOMY LEVEL 2 (N/A) LYMPHADENECTOMY (Bilateral)  Patient Location: PACU  Anesthesia Type: General  Level of Consciousness: awake and alert   Airway and Oxygen Therapy: Patient Spontanous Breathing  Post-op Pain: mild  Post-op Assessment: Post-op Vital signs reviewed, Patient's Cardiovascular Status Stable, Respiratory Function Stable, Patent Airway and No signs of Nausea or vomiting  Last Vitals:  Filed Vitals:   02/27/13 1500  BP: 177/105  Pulse: 117  Temp:   Resp: 9    Post-op Vital Signs: stable   Complications: No apparent anesthesia complications. Required some beta blockade in PACU for elevated HR and BP. ECG OK. To Telemetry.

## 2013-02-27 NOTE — Transfer of Care (Signed)
Immediate Anesthesia Transfer of Care Note  Patient: [name]  Procedure(s) Performed: Procedure(s): ROBOTIC ASSISTED LAPAROSCOPIC RADICAL PROSTATECTOMY LEVEL 2 (N/A) LYMPHADENECTOMY (Bilateral)  Patient Location: PACU  Anesthesia Type:General  Level of Consciousness: awake, alert  and oriented  Airway & Oxygen Therapy: Patient Spontanous Breathing and Patient connected to face mask oxygen  Post-op Assessment: Report given to PACU RN and Post -op Vital signs reviewed and stable  Post vital signs: Reviewed and stable  Complications: No apparent anesthesia complications

## 2013-02-27 NOTE — Progress Notes (Signed)
Patient ID: [name], male   DOB: 18-Nov-1945, 67 y.o.   MRN: [id number]  Post-op note  Subjective: The patient is doing well.  No complaints. He did experience transient tachycardia up to 130s in PACU.  ECG indicated sinus rhythm and HR has decreased to 70s now.  No palpitations or chest pain.  Objective: Vital signs in last 24 hours: Temp:  [97.6 F (36.4 C)-99.1 F (37.3 C)] 98 F (36.7 C) (08/14 1615) Pulse Rate:  [68-117] 72 (08/14 1615) Resp:  [9-23] 14 (08/14 1615) BP: (140-182)/(73-109) 140/73 mmHg (08/14 1615) SpO2:  [93 %-97 %] 94 % (08/14 1615) Weight:  [116 kg (255 lb 11.7 oz)] 116 kg (255 lb 11.7 oz) (08/14 1630)  Intake/Output from previous day:   Intake/Output this shift:    Physical Exam:  General: Alert and oriented. Abdomen: Soft, Nondistended. Incisions: Clean and dry.  Lab Results:  Recent Labs  02/27/13 1519  HGB 15.4  HCT 44.2    Assessment/Plan: POD#0   1) Continue to monitor   [name]. MD   LOS: 0 days   Zuhayr Deeney,LES 02/27/2013, 7:49 PM

## 2013-02-27 NOTE — Anesthesia Preprocedure Evaluation (Signed)
Anesthesia Evaluation  Patient identified by MRN, date of birth, ID band Patient awake    Reviewed: Allergy & Precautions, H&P , NPO status , Patient's Chart, lab work & pertinent test results  Airway Mallampati: II TM Distance: <3 FB Neck ROM: Full    Dental no notable dental hx.    Pulmonary neg pulmonary ROS,  breath sounds clear to auscultation  Pulmonary exam normal       Cardiovascular hypertension, Pt. on medications Rhythm:Regular Rate:Normal     Neuro/Psych Glaucoma  May be increased risk for PION  negative psych ROS   GI/Hepatic negative GI ROS, Neg liver ROS,   Endo/Other  Morbid obesity  Renal/GU negative Renal ROS  negative genitourinary   Musculoskeletal negative musculoskeletal ROS (+)   Abdominal   Peds negative pediatric ROS (+)  Hematology negative hematology ROS (+)   Anesthesia Other Findings   Reproductive/Obstetrics negative OB ROS                           Anesthesia Physical Anesthesia Plan  ASA: II  Anesthesia Plan: General   Post-op Pain Management:    Induction: Intravenous  Airway Management Planned: Oral ETT  Additional Equipment:   Intra-op Plan:   Post-operative Plan: Extubation in OR  Informed Consent: I have reviewed the patients History and Physical, chart, labs and discussed the procedure including the risks, benefits and alternatives for the proposed anesthesia with the patient or authorized representative who has indicated his/her understanding and acceptance.   Dental advisory given  Plan Discussed with: CRNA and Surgeon  Anesthesia Plan Comments:         Anesthesia Quick Evaluation

## 2013-02-28 ENCOUNTER — Encounter (HOSPITAL_COMMUNITY): Payer: Self-pay | Admitting: Urology

## 2013-02-28 MED ORDER — BISACODYL 10 MG RE SUPP
10.0000 mg | Freq: Once | RECTAL | Status: AC
  Administered 2013-02-28: 10 mg via RECTAL
  Filled 2013-02-28: qty 1

## 2013-02-28 MED ORDER — HYDROCODONE-ACETAMINOPHEN 5-325 MG PO TABS
1.0000 | ORAL_TABLET | Freq: Four times a day (QID) | ORAL | Status: DC | PRN
  Administered 2013-02-28: 2 via ORAL
  Filled 2013-02-28: qty 2

## 2013-02-28 NOTE — Progress Notes (Signed)
Patient ID: [name], male   DOB: Aug 29, 1945, 67 y.o.   MRN: [id number]  1 Day Post-Op Subjective: The patient is doing well.  No nausea or vomiting. Pain is adequately controlled. No tachycardia, palpitations, or chest pain.  Objective: Vital signs in last 24 hours: Temp:  [97.6 F (36.4 C)-99.1 F (37.3 C)] 98.7 F (37.1 C) (08/15 0646) Pulse Rate:  [68-117] 71 (08/15 0646) Resp:  [9-23] 16 (08/15 0646) BP: (112-182)/(53-109) 120/53 mmHg (08/15 0646) SpO2:  [93 %-97 %] 95 % (08/15 0646) Weight:  [116 kg (255 lb 11.7 oz)] 116 kg (255 lb 11.7 oz) (08/14 1630)  Intake/Output from previous day: 08/14 0701 - 08/15 0700 In: 5175 [I.V.:4075; IV Piggyback:1100] Out: 2835 [Urine:2625; Drains:110; Blood:100] Intake/Output this shift:    Physical Exam:  General: Alert and oriented. CV: RRR Lungs: Clear bilaterally. GI: Soft, Nondistended. Incisions: Dressings intact. Urine: Clear Extremities: Nontender, no erythema, no edema.  Lab Results:  Recent Labs  02/27/13 1519 02/28/13 0437  HGB 15.4 14.6  HCT 44.2 42.7      Assessment/Plan: POD# 1 s/p robotic prostatectomy.  1) SL IVF 2) Ambulate, Incentive spirometry 3) Transition to oral pain medication 4) Dulcolax suppository 5) D/C pelvic drain 6) Plan for likely discharge later today   [name]. MD   LOS: 1 day   [name],LES 02/28/2013, 7:29 AM

## 2013-02-28 NOTE — Discharge Summary (Signed)
  Date of admission: 02/27/2013  Date of discharge: 02/28/2013  Admission diagnosis: Prostate Cancer  Discharge diagnosis: Prostate Cancer  History and Physical: For full details, please see admission history and physical. Briefly, [name] is a 67 y.o. gentleman with localized prostate cancer.  After discussing management/treatment options, he elected to proceed with surgical treatment.  Hospital Course: [name] was taken to the operating room on 02/27/2013 and underwent a robotic assisted laparoscopic radical prostatectomy. He tolerated this procedure well and without complications. Postoperatively, he was able to be transferred to a regular hospital room following recovery from anesthesia.  He was able to begin ambulating the night of surgery. He remained hemodynamically stable overnight.  He had excellent urine output with appropriately minimal output from his pelvic drain and his pelvic drain was removed on POD #1.  He was transitioned to oral pain medication, tolerated a clear liquid diet, and had met all discharge criteria and was able to be discharged home later on POD#1.  Laboratory values:  Recent Labs  02/27/13 1519 02/28/13 0437  HGB 15.4 14.6  HCT 44.2 42.7    Disposition: Home  Discharge instruction: He was instructed to be ambulatory but to refrain from heavy lifting, strenuous activity, or driving. He was instructed on urethral catheter care.  Discharge medications:     Medication List    STOP taking these medications       aspirin EC 81 MG tablet     ibuprofen 200 MG tablet  Commonly known as:  ADVIL,MOTRIN      TAKE these medications       ciprofloxacin 500 MG tablet  Commonly known as:  CIPRO  Take 1 tablet (500 mg total) by mouth 2 (two) times daily. Start day prior to office visit for foley removal     HYDROcodone-acetaminophen 5-325 MG per tablet  Commonly known as:  NORCO  Take 1-2 tablets by mouth every 6 (six) hours as needed for  pain.     losartan 100 MG tablet  Commonly known as:  COZAAR  Take 100 mg by mouth every morning.     simvastatin 20 MG tablet  Commonly known as:  ZOCOR  Take 20 mg by mouth at bedtime.     timolol 0.5 % ophthalmic solution  Commonly known as:  BETIMOL  Place 1 drop into the right eye every morning.        Followup: He will followup in 1 week for catheter removal and to discuss his surgical pathology results.

## 2013-02-28 NOTE — Progress Notes (Signed)
Patient discharge home, discharge instruction given, teaching for foley bag and leg bag done, understanding for each verbalized, no question at this time.

## 2013-03-14 ENCOUNTER — Other Ambulatory Visit: Payer: Self-pay | Admitting: Internal Medicine

## 2013-03-14 NOTE — Telephone Encounter (Signed)
Rx sent to the pharmacy by e-script.//AB/CMA 

## 2013-04-08 DIAGNOSIS — M6281 Muscle weakness (generalized): Secondary | ICD-10-CM | POA: Diagnosis not present

## 2013-04-08 DIAGNOSIS — R279 Unspecified lack of coordination: Secondary | ICD-10-CM | POA: Diagnosis not present

## 2013-04-08 DIAGNOSIS — N393 Stress incontinence (female) (male): Secondary | ICD-10-CM | POA: Diagnosis not present

## 2013-04-24 DIAGNOSIS — M6281 Muscle weakness (generalized): Secondary | ICD-10-CM | POA: Diagnosis not present

## 2013-04-24 DIAGNOSIS — N393 Stress incontinence (female) (male): Secondary | ICD-10-CM | POA: Diagnosis not present

## 2013-04-24 DIAGNOSIS — R279 Unspecified lack of coordination: Secondary | ICD-10-CM | POA: Diagnosis not present

## 2013-04-25 DIAGNOSIS — Z23 Encounter for immunization: Secondary | ICD-10-CM | POA: Diagnosis not present

## 2013-05-02 DIAGNOSIS — C61 Malignant neoplasm of prostate: Secondary | ICD-10-CM | POA: Diagnosis not present

## 2013-05-02 DIAGNOSIS — N393 Stress incontinence (female) (male): Secondary | ICD-10-CM | POA: Diagnosis not present

## 2013-05-21 DIAGNOSIS — R279 Unspecified lack of coordination: Secondary | ICD-10-CM | POA: Diagnosis not present

## 2013-05-21 DIAGNOSIS — M6281 Muscle weakness (generalized): Secondary | ICD-10-CM | POA: Diagnosis not present

## 2013-05-21 DIAGNOSIS — N393 Stress incontinence (female) (male): Secondary | ICD-10-CM | POA: Diagnosis not present

## 2013-05-22 ENCOUNTER — Other Ambulatory Visit: Payer: Self-pay

## 2013-06-04 DIAGNOSIS — N393 Stress incontinence (female) (male): Secondary | ICD-10-CM | POA: Diagnosis not present

## 2013-06-04 DIAGNOSIS — R279 Unspecified lack of coordination: Secondary | ICD-10-CM | POA: Diagnosis not present

## 2013-06-04 DIAGNOSIS — M6281 Muscle weakness (generalized): Secondary | ICD-10-CM | POA: Diagnosis not present

## 2013-06-25 DIAGNOSIS — N393 Stress incontinence (female) (male): Secondary | ICD-10-CM | POA: Diagnosis not present

## 2013-06-25 DIAGNOSIS — M6281 Muscle weakness (generalized): Secondary | ICD-10-CM | POA: Diagnosis not present

## 2013-06-25 DIAGNOSIS — R279 Unspecified lack of coordination: Secondary | ICD-10-CM | POA: Diagnosis not present

## 2013-07-30 DIAGNOSIS — N393 Stress incontinence (female) (male): Secondary | ICD-10-CM | POA: Diagnosis not present

## 2013-07-30 DIAGNOSIS — M6281 Muscle weakness (generalized): Secondary | ICD-10-CM | POA: Diagnosis not present

## 2013-07-30 DIAGNOSIS — R279 Unspecified lack of coordination: Secondary | ICD-10-CM | POA: Diagnosis not present

## 2013-08-06 DIAGNOSIS — H4030X0 Glaucoma secondary to eye trauma, unspecified eye, stage unspecified: Secondary | ICD-10-CM | POA: Diagnosis not present

## 2013-08-06 DIAGNOSIS — H409 Unspecified glaucoma: Secondary | ICD-10-CM | POA: Diagnosis not present

## 2013-08-13 DIAGNOSIS — H409 Unspecified glaucoma: Secondary | ICD-10-CM | POA: Diagnosis not present

## 2013-08-13 DIAGNOSIS — H2510 Age-related nuclear cataract, unspecified eye: Secondary | ICD-10-CM | POA: Diagnosis not present

## 2013-08-13 DIAGNOSIS — C61 Malignant neoplasm of prostate: Secondary | ICD-10-CM | POA: Diagnosis not present

## 2013-08-13 DIAGNOSIS — H4030X0 Glaucoma secondary to eye trauma, unspecified eye, stage unspecified: Secondary | ICD-10-CM | POA: Diagnosis not present

## 2013-08-13 DIAGNOSIS — S0590XA Unspecified injury of unspecified eye and orbit, initial encounter: Secondary | ICD-10-CM | POA: Diagnosis not present

## 2013-08-20 DIAGNOSIS — N393 Stress incontinence (female) (male): Secondary | ICD-10-CM | POA: Diagnosis not present

## 2013-08-20 DIAGNOSIS — C61 Malignant neoplasm of prostate: Secondary | ICD-10-CM | POA: Diagnosis not present

## 2013-08-27 DIAGNOSIS — R279 Unspecified lack of coordination: Secondary | ICD-10-CM | POA: Diagnosis not present

## 2013-08-27 DIAGNOSIS — M6281 Muscle weakness (generalized): Secondary | ICD-10-CM | POA: Diagnosis not present

## 2013-08-27 DIAGNOSIS — N393 Stress incontinence (female) (male): Secondary | ICD-10-CM | POA: Diagnosis not present

## 2013-09-25 ENCOUNTER — Ambulatory Visit (INDEPENDENT_AMBULATORY_CARE_PROVIDER_SITE_OTHER): Payer: Medicare Other | Admitting: Internal Medicine

## 2013-09-25 ENCOUNTER — Other Ambulatory Visit (INDEPENDENT_AMBULATORY_CARE_PROVIDER_SITE_OTHER): Payer: Medicare Other

## 2013-09-25 ENCOUNTER — Encounter: Payer: Self-pay | Admitting: Internal Medicine

## 2013-09-25 VITALS — BP 130/88 | HR 112 | Temp 97.5°F | Resp 13 | Ht 72.0 in | Wt 256.1 lb

## 2013-09-25 DIAGNOSIS — R7309 Other abnormal glucose: Secondary | ICD-10-CM | POA: Diagnosis not present

## 2013-09-25 DIAGNOSIS — E782 Mixed hyperlipidemia: Secondary | ICD-10-CM

## 2013-09-25 DIAGNOSIS — Z0000 Encounter for general adult medical examination without abnormal findings: Secondary | ICD-10-CM

## 2013-09-25 DIAGNOSIS — K5730 Diverticulosis of large intestine without perforation or abscess without bleeding: Secondary | ICD-10-CM

## 2013-09-25 DIAGNOSIS — I10 Essential (primary) hypertension: Secondary | ICD-10-CM

## 2013-09-25 LAB — CBC WITH DIFFERENTIAL/PLATELET
BASOS PCT: 0.6 % (ref 0.0–3.0)
Basophils Absolute: 0 10*3/uL (ref 0.0–0.1)
EOS PCT: 1.2 % (ref 0.0–5.0)
Eosinophils Absolute: 0.1 10*3/uL (ref 0.0–0.7)
HEMATOCRIT: 48.4 % (ref 39.0–52.0)
Hemoglobin: 16.6 g/dL (ref 13.0–17.0)
LYMPHS ABS: 1.6 10*3/uL (ref 0.7–4.0)
Lymphocytes Relative: 20.9 % (ref 12.0–46.0)
MCHC: 34.2 g/dL (ref 30.0–36.0)
MCV: 87.6 fl (ref 78.0–100.0)
MONO ABS: 0.6 10*3/uL (ref 0.1–1.0)
Monocytes Relative: 7.9 % (ref 3.0–12.0)
NEUTROS ABS: 5.2 10*3/uL (ref 1.4–7.7)
Neutrophils Relative %: 69.4 % (ref 43.0–77.0)
Platelets: 218 10*3/uL (ref 150.0–400.0)
RBC: 5.53 Mil/uL (ref 4.22–5.81)
RDW: 13.2 % (ref 11.5–14.6)
WBC: 7.5 10*3/uL (ref 4.5–10.5)

## 2013-09-25 LAB — HEMOGLOBIN A1C: HEMOGLOBIN A1C: 5.4 % (ref 4.6–6.5)

## 2013-09-25 LAB — HEPATIC FUNCTION PANEL
ALBUMIN: 4.4 g/dL (ref 3.5–5.2)
ALT: 20 U/L (ref 0–53)
AST: 18 U/L (ref 0–37)
Alkaline Phosphatase: 95 U/L (ref 39–117)
Bilirubin, Direct: 0.2 mg/dL (ref 0.0–0.3)
TOTAL PROTEIN: 7.3 g/dL (ref 6.0–8.3)
Total Bilirubin: 1.4 mg/dL — ABNORMAL HIGH (ref 0.3–1.2)

## 2013-09-25 LAB — BASIC METABOLIC PANEL
BUN: 20 mg/dL (ref 6–23)
CHLORIDE: 106 meq/L (ref 96–112)
CO2: 25 mEq/L (ref 19–32)
Calcium: 9.3 mg/dL (ref 8.4–10.5)
Creatinine, Ser: 0.7 mg/dL (ref 0.4–1.5)
GFR: 111.88 mL/min (ref >60.00)
GLUCOSE: 99 mg/dL (ref 70–99)
POTASSIUM: 4.2 meq/L (ref 3.5–5.1)
Sodium: 138 mEq/L (ref 135–145)

## 2013-09-25 LAB — LIPID PANEL
CHOLESTEROL: 174 mg/dL (ref 0–200)
HDL: 40.9 mg/dL (ref >39.00)
LDL Cholesterol: 108 mg/dL — ABNORMAL HIGH (ref 0–99)
Total CHOL/HDL Ratio: 4
Triglycerides: 125 mg/dL (ref 0.0–149.0)
VLDL: 25 mg/dL (ref 0.0–40.0)

## 2013-09-25 LAB — TSH: TSH: 1.66 u[IU]/mL (ref 0.35–5.50)

## 2013-09-25 MED ORDER — SIMVASTATIN 20 MG PO TABS: ORAL_TABLET | ORAL | Status: DC

## 2013-09-25 MED ORDER — LOSARTAN POTASSIUM 100 MG PO TABS: 100.0000 mg | ORAL_TABLET | Freq: Every morning | ORAL | Status: DC

## 2013-09-25 NOTE — Patient Instructions (Signed)

## 2013-09-25 NOTE — Progress Notes (Signed)
Subjective:    Patient ID: [name], male    DOB: 25-Sep-1945, 68 y.o.   MRN: [id number]  HPI  Medicare Wellness Visit: Psychosocial and medical history were reviewed as required by Medicare (history related to abuse, antisocial behavior , firearm risk). Social history: Caffeine:1/2 cup or < /day  , Alcohol:no  , Tobacco PNT:IRWER Exercise:2-3 X/week on machine X 30 min Personal safety/fall risk:no Limitations of activities of daily living:no Seatbelt/ smoke alarm use:yes Healthcare Power of Attorney/Living Will status: UTD Ophthalmologic exam status:UTD Hearing evaluation status:not current Orientation: Oriented X 3 Memory and recall: good Math testing: good Depression/anxiety assessment: no Foreign travel history:2013 [place] Immunization status for influenza/pneumonia/ shingles /tetanus:Shingles needed Transfusion history:no Preventive health care maintenance status: Colonoscopy as per protocol/standard care:UTD Dental care:every 6 mos Chart reviewed and updated. Active issues reviewed and addressed as documented below.    Review of Systems Blood pressure range / average :120s/80s Compliant with anti hypertemsive medication. Occasional mild lightheadedness or other adverse medication effect described.  A heart healthy /low salt diet is followed. Significant headaches, epistaxis, chest pain, palpitations, exertional dyspnea, claudication, paroxysmal nocturnal dyspnea, or edema absent. Family history is positive for premature coronary disease. Advanced cholesterol testing reveals  LDL goal is less than 100 ; ideally < 70 . There is medication compliance with the statin.  Low dose ASA taken Significant abdominal symptoms, memory deficit, or myalgias not present.     Objective:   Physical Exam Gen.: Healthy and well-nourished in appearance. Alert, appropriate and cooperative throughout exam.Appears younger than stated age  Head: Normocephalic without obvious  abnormalities; Eyes: No corneal or conjunctival inflammation noted. Pupils equal round reactive to light and accommodation. Extraocular motion intact.Mild unsustained nystagmus bilaterally Ears: External  ear exam reveals no significant lesions or deformities. Canals clear .TMs normal. Hearing is grossly normal bilaterally. Nose: External nasal exam reveals no deformity or inflammation. Nasal mucosa are pink and moist. No lesions or exudates noted.  R septal dislocation Mouth: Oral mucosa and oropharynx reveal no lesions or exudates. Teeth in good repair. Neck: No deformities, masses, or tenderness noted. Range of motion & Thyroid normal. Lungs: Normal respiratory effort; chest expands symmetrically. Lungs are clear to auscultation without rales, wheezes, or increased work of breathing. Heart: Fast rate and regular rhythm. Normal S1 and S2. No gallop, click, or rub. Grade 1/6 systolic murmur . Abdomen: Bowel sounds normal; abdomen soft and nontender. No masses, organomegaly or hernias noted. Genitalia:  as per Urology                                 Musculoskeletal/extremities: No deformity or scoliosis noted of  the thoracic or lumbar spine.   No clubbing, cyanosis, edema, or significant extremity  deformity noted. Range of motion decreased L knee.Tone & strength normal. Hand joints normal  Fingernail health good. Able to lie down & sit up w/o help. Negative SLR bilaterally Vascular: Carotid, radial artery, dorsalis pedis and  posterior tibial pulses are full and equal. No bruits present. Neurologic: Alert and oriented x3. Deep tendon reflexes symmetrical and normal.  Gait normal         Skin: Intact without suspicious lesions or rashes. Lymph: No cervical, axillary lymphadenopathy present. Psych: Mood and affect are normal. Normally interactive  Assessment & Plan:  #1 Medicare Wellness Exam; criteria  met ; data entered #2 Problem List/Diagnoses reviewed #3 tachycardia Plan:  Assessments made/ Orders entered

## 2013-09-25 NOTE — Progress Notes (Signed)
Pre visit review using our clinic review tool, if applicable. No additional management support is needed unless otherwise documented below in the visit note. 

## 2013-10-01 ENCOUNTER — Encounter: Payer: Self-pay | Admitting: Internal Medicine

## 2013-10-01 DIAGNOSIS — R279 Unspecified lack of coordination: Secondary | ICD-10-CM | POA: Diagnosis not present

## 2013-10-01 DIAGNOSIS — N393 Stress incontinence (female) (male): Secondary | ICD-10-CM | POA: Diagnosis not present

## 2013-10-01 DIAGNOSIS — M6281 Muscle weakness (generalized): Secondary | ICD-10-CM | POA: Diagnosis not present

## 2014-02-18 DIAGNOSIS — C61 Malignant neoplasm of prostate: Secondary | ICD-10-CM | POA: Diagnosis not present

## 2014-02-25 DIAGNOSIS — N393 Stress incontinence (female) (male): Secondary | ICD-10-CM | POA: Diagnosis not present

## 2014-02-25 DIAGNOSIS — N529 Male erectile dysfunction, unspecified: Secondary | ICD-10-CM | POA: Diagnosis not present

## 2014-02-25 DIAGNOSIS — C61 Malignant neoplasm of prostate: Secondary | ICD-10-CM | POA: Diagnosis not present

## 2014-03-10 DIAGNOSIS — M6281 Muscle weakness (generalized): Secondary | ICD-10-CM | POA: Diagnosis not present

## 2014-03-10 DIAGNOSIS — R279 Unspecified lack of coordination: Secondary | ICD-10-CM | POA: Diagnosis not present

## 2014-03-10 DIAGNOSIS — N393 Stress incontinence (female) (male): Secondary | ICD-10-CM | POA: Diagnosis not present

## 2014-03-10 DIAGNOSIS — C61 Malignant neoplasm of prostate: Secondary | ICD-10-CM | POA: Diagnosis not present

## 2014-04-01 ENCOUNTER — Encounter: Payer: Self-pay | Admitting: Gastroenterology

## 2014-04-29 DIAGNOSIS — R278 Other lack of coordination: Secondary | ICD-10-CM | POA: Diagnosis not present

## 2014-04-29 DIAGNOSIS — M6281 Muscle weakness (generalized): Secondary | ICD-10-CM | POA: Diagnosis not present

## 2014-04-29 DIAGNOSIS — N393 Stress incontinence (female) (male): Secondary | ICD-10-CM | POA: Diagnosis not present

## 2014-05-13 DIAGNOSIS — H4031X2 Glaucoma secondary to eye trauma, right eye, moderate stage: Secondary | ICD-10-CM | POA: Diagnosis not present

## 2014-05-13 DIAGNOSIS — M6281 Muscle weakness (generalized): Secondary | ICD-10-CM | POA: Diagnosis not present

## 2014-05-13 DIAGNOSIS — N393 Stress incontinence (female) (male): Secondary | ICD-10-CM | POA: Diagnosis not present

## 2014-05-13 DIAGNOSIS — H2513 Age-related nuclear cataract, bilateral: Secondary | ICD-10-CM | POA: Diagnosis not present

## 2014-05-13 DIAGNOSIS — R278 Other lack of coordination: Secondary | ICD-10-CM | POA: Diagnosis not present

## 2014-05-27 DIAGNOSIS — R278 Other lack of coordination: Secondary | ICD-10-CM | POA: Diagnosis not present

## 2014-05-27 DIAGNOSIS — N393 Stress incontinence (female) (male): Secondary | ICD-10-CM | POA: Diagnosis not present

## 2014-05-27 DIAGNOSIS — M6281 Muscle weakness (generalized): Secondary | ICD-10-CM | POA: Diagnosis not present

## 2014-07-01 ENCOUNTER — Ambulatory Visit: Payer: Medicare Other

## 2014-07-01 ENCOUNTER — Ambulatory Visit (INDEPENDENT_AMBULATORY_CARE_PROVIDER_SITE_OTHER): Payer: Medicare Other | Admitting: *Deleted

## 2014-07-01 DIAGNOSIS — N393 Stress incontinence (female) (male): Secondary | ICD-10-CM | POA: Diagnosis not present

## 2014-07-01 DIAGNOSIS — R278 Other lack of coordination: Secondary | ICD-10-CM | POA: Diagnosis not present

## 2014-07-01 DIAGNOSIS — Z23 Encounter for immunization: Secondary | ICD-10-CM

## 2014-07-01 DIAGNOSIS — M6281 Muscle weakness (generalized): Secondary | ICD-10-CM | POA: Diagnosis not present

## 2014-08-12 DIAGNOSIS — M6281 Muscle weakness (generalized): Secondary | ICD-10-CM | POA: Diagnosis not present

## 2014-08-12 DIAGNOSIS — R278 Other lack of coordination: Secondary | ICD-10-CM | POA: Diagnosis not present

## 2014-08-12 DIAGNOSIS — N393 Stress incontinence (female) (male): Secondary | ICD-10-CM | POA: Diagnosis not present

## 2014-08-26 DIAGNOSIS — C61 Malignant neoplasm of prostate: Secondary | ICD-10-CM | POA: Diagnosis not present

## 2014-09-02 DIAGNOSIS — Z8546 Personal history of malignant neoplasm of prostate: Secondary | ICD-10-CM | POA: Diagnosis not present

## 2014-09-02 DIAGNOSIS — N393 Stress incontinence (female) (male): Secondary | ICD-10-CM | POA: Diagnosis not present

## 2014-09-02 DIAGNOSIS — N5201 Erectile dysfunction due to arterial insufficiency: Secondary | ICD-10-CM | POA: Diagnosis not present

## 2014-09-09 DIAGNOSIS — M6281 Muscle weakness (generalized): Secondary | ICD-10-CM | POA: Diagnosis not present

## 2014-09-09 DIAGNOSIS — R278 Other lack of coordination: Secondary | ICD-10-CM | POA: Diagnosis not present

## 2014-09-09 DIAGNOSIS — N393 Stress incontinence (female) (male): Secondary | ICD-10-CM | POA: Diagnosis not present

## 2014-10-01 ENCOUNTER — Other Ambulatory Visit: Payer: Self-pay | Admitting: Dermatology

## 2014-10-01 DIAGNOSIS — D225 Melanocytic nevi of trunk: Secondary | ICD-10-CM | POA: Diagnosis not present

## 2014-10-01 DIAGNOSIS — L738 Other specified follicular disorders: Secondary | ICD-10-CM | POA: Diagnosis not present

## 2014-10-01 DIAGNOSIS — D2339 Other benign neoplasm of skin of other parts of face: Secondary | ICD-10-CM | POA: Diagnosis not present

## 2014-10-01 DIAGNOSIS — L570 Actinic keratosis: Secondary | ICD-10-CM | POA: Diagnosis not present

## 2014-10-06 ENCOUNTER — Other Ambulatory Visit: Payer: Self-pay | Admitting: Internal Medicine

## 2014-10-07 DIAGNOSIS — N393 Stress incontinence (female) (male): Secondary | ICD-10-CM | POA: Diagnosis not present

## 2014-10-07 DIAGNOSIS — M6281 Muscle weakness (generalized): Secondary | ICD-10-CM | POA: Diagnosis not present

## 2014-10-07 DIAGNOSIS — R278 Other lack of coordination: Secondary | ICD-10-CM | POA: Diagnosis not present

## 2014-11-19 DIAGNOSIS — H4031X2 Glaucoma secondary to eye trauma, right eye, moderate stage: Secondary | ICD-10-CM | POA: Diagnosis not present

## 2014-12-16 DIAGNOSIS — H5203 Hypermetropia, bilateral: Secondary | ICD-10-CM | POA: Diagnosis not present

## 2014-12-16 DIAGNOSIS — H4031X2 Glaucoma secondary to eye trauma, right eye, moderate stage: Secondary | ICD-10-CM | POA: Diagnosis not present

## 2014-12-17 DIAGNOSIS — M6281 Muscle weakness (generalized): Secondary | ICD-10-CM | POA: Diagnosis not present

## 2014-12-17 DIAGNOSIS — R278 Other lack of coordination: Secondary | ICD-10-CM | POA: Diagnosis not present

## 2014-12-17 DIAGNOSIS — N393 Stress incontinence (female) (male): Secondary | ICD-10-CM | POA: Diagnosis not present

## 2014-12-25 ENCOUNTER — Encounter: Payer: Medicare Other | Admitting: Internal Medicine

## 2015-01-05 ENCOUNTER — Encounter: Payer: Self-pay | Admitting: Internal Medicine

## 2015-01-05 ENCOUNTER — Ambulatory Visit (INDEPENDENT_AMBULATORY_CARE_PROVIDER_SITE_OTHER): Payer: Medicare Other | Admitting: Internal Medicine

## 2015-01-05 VITALS — BP 138/90 | HR 71 | Temp 98.1°F | Resp 16 | Ht 73.0 in | Wt 254.0 lb

## 2015-01-05 DIAGNOSIS — I10 Essential (primary) hypertension: Secondary | ICD-10-CM

## 2015-01-05 DIAGNOSIS — K5730 Diverticulosis of large intestine without perforation or abscess without bleeding: Secondary | ICD-10-CM | POA: Diagnosis not present

## 2015-01-05 DIAGNOSIS — E782 Mixed hyperlipidemia: Secondary | ICD-10-CM

## 2015-01-05 DIAGNOSIS — R739 Hyperglycemia, unspecified: Secondary | ICD-10-CM

## 2015-01-05 MED ORDER — SIMVASTATIN 20 MG PO TABS
20.0000 mg | ORAL_TABLET | Freq: Every day | ORAL | Status: DC
Start: 2015-01-05 — End: 2016-01-06

## 2015-01-05 MED ORDER — LOSARTAN POTASSIUM 100 MG PO TABS: 100.0000 mg | ORAL_TABLET | Freq: Every morning | ORAL | Status: DC

## 2015-01-05 NOTE — Assessment & Plan Note (Signed)
A1c

## 2015-01-05 NOTE — Assessment & Plan Note (Signed)
Lipids, LFTs, TSH  

## 2015-01-05 NOTE — Assessment & Plan Note (Signed)
Blood pressure goals reviewed. BMET 

## 2015-01-05 NOTE — Progress Notes (Signed)
Pre visit review using our clinic review tool, if applicable. No additional management support is needed unless otherwise documented below in the visit note. 

## 2015-01-05 NOTE — Patient Instructions (Signed)
  Your next office appointment will be determined based upon review of your pending labs . Those written interpretation of the lab results and instructions will be transmitted to you by My Chart  Critical results will be called.   Followup as needed for any active or acute issue. Please report any significant change in your symptoms.    Minimal Blood Pressure Goal= AVERAGE < 140/90;  Ideal is an AVERAGE < 135/85. This AVERAGE should be calculated from @ least 5-7 BP readings taken @ different times of day on different days of week. You should not respond to isolated BP readings , but rather the AVERAGE for that week .Please bring your  blood pressure cuff to office visits to verify that it is reliable.It  can also be checked against the blood pressure device at the pharmacy. Finger or wrist cuffs are not dependable; an arm cuff is.

## 2015-01-05 NOTE — Assessment & Plan Note (Signed)
CBC

## 2015-01-06 ENCOUNTER — Other Ambulatory Visit (INDEPENDENT_AMBULATORY_CARE_PROVIDER_SITE_OTHER): Payer: Medicare Other

## 2015-01-06 DIAGNOSIS — R739 Hyperglycemia, unspecified: Secondary | ICD-10-CM | POA: Diagnosis not present

## 2015-01-06 DIAGNOSIS — K5730 Diverticulosis of large intestine without perforation or abscess without bleeding: Secondary | ICD-10-CM

## 2015-01-06 DIAGNOSIS — E782 Mixed hyperlipidemia: Secondary | ICD-10-CM | POA: Diagnosis not present

## 2015-01-06 LAB — CBC WITH DIFFERENTIAL/PLATELET
BASOS ABS: 0 10*3/uL (ref 0.0–0.1)
Basophils Relative: 0.5 % (ref 0.0–3.0)
Eosinophils Absolute: 0.2 10*3/uL (ref 0.0–0.7)
Eosinophils Relative: 2.2 % (ref 0.0–5.0)
HCT: 48.8 % (ref 39.0–52.0)
Hemoglobin: 16.5 g/dL (ref 13.0–17.0)
Lymphocytes Relative: 24.6 % (ref 12.0–46.0)
Lymphs Abs: 1.8 10*3/uL (ref 0.7–4.0)
MCHC: 33.9 g/dL (ref 30.0–36.0)
MCV: 88.2 fl (ref 78.0–100.0)
MONO ABS: 0.8 10*3/uL (ref 0.1–1.0)
Monocytes Relative: 11.2 % (ref 3.0–12.0)
NEUTROS PCT: 61.5 % (ref 43.0–77.0)
Neutro Abs: 4.4 10*3/uL (ref 1.4–7.7)
Platelets: 197 10*3/uL (ref 150.0–400.0)
RBC: 5.53 Mil/uL (ref 4.22–5.81)
RDW: 13.1 % (ref 11.5–15.5)
WBC: 7.2 10*3/uL (ref 4.0–10.5)

## 2015-01-06 LAB — HEPATIC FUNCTION PANEL
ALK PHOS: 84 U/L (ref 39–117)
ALT: 18 U/L (ref 0–53)
AST: 17 U/L (ref 0–37)
Albumin: 4.1 g/dL (ref 3.5–5.2)
Bilirubin, Direct: 0.2 mg/dL (ref 0.0–0.3)
TOTAL PROTEIN: 6.9 g/dL (ref 6.0–8.3)
Total Bilirubin: 0.9 mg/dL (ref 0.2–1.2)

## 2015-01-06 LAB — BASIC METABOLIC PANEL
BUN: 23 mg/dL (ref 6–23)
CALCIUM: 9.2 mg/dL (ref 8.4–10.5)
CO2: 26 mEq/L (ref 19–32)
Chloride: 104 mEq/L (ref 96–112)
Creatinine, Ser: 0.88 mg/dL (ref 0.40–1.50)
GFR: 91.26 mL/min (ref >60.00)
GLUCOSE: 110 mg/dL — AB (ref 70–99)
Potassium: 4.4 mEq/L (ref 3.5–5.1)
Sodium: 137 mEq/L (ref 135–145)

## 2015-01-06 LAB — LIPID PANEL
Cholesterol: 164 mg/dL (ref 0–200)
HDL: 37.1 mg/dL — AB (ref >39.00)
LDL CALC: 107 mg/dL — AB (ref 0–99)
NonHDL: 126.9
Total CHOL/HDL Ratio: 4
Triglycerides: 102 mg/dL (ref 0.0–149.0)
VLDL: 20.4 mg/dL (ref 0.0–40.0)

## 2015-01-06 LAB — HEMOGLOBIN A1C: Hgb A1c MFr Bld: 5.2 % (ref 4.6–6.5)

## 2015-01-06 LAB — TSH: TSH: 2.65 u[IU]/mL (ref 0.35–4.50)

## 2015-01-06 NOTE — Progress Notes (Signed)
   Subjective:    Patient ID: [name], male    DOB: 1945-09-29, 69 y.o.   MRN: [id number]  HPI The patient is here to assess status of active health conditions.  PMH, FH, & Social History reviewed & updated.  On modified heart healthy,low salt diet.Exercising as New Step , machines & weights 2-3 X/ week for 45-60 minutes w/o symptoms. BP averages 120/85 @ home.    Review of Systems Urge incontinence necessitates pad; ongoing nocturia. Some R shoulder pain with weight work.  Chest pain, palpitations, tachycardia, exertional dyspnea, paroxysmal nocturnal dyspnea, claudication or edema are absent. No unexplained weight loss, abdominal pain, significant dyspepsia, dysphagia, melena, rectal bleeding, or persistently small caliber stools. Dysuria, pyuria, hematuria, frequency, or polyuria are denied. Change in hair, skin, nails denied. No bowel changes of constipation or diarrhea. No intolerance to heat or cold.     Objective:   Physical Exam  Pertinent or positive findings present  include:Pattern alopecia. Wax L otic canal.Grade 1 systolic murmur.Abdomen protuberant with striae.Flexion contractures L 3rd-5th digits.Vitiligo of hands.Crepitus of knees.  General appearance :adequately nourished; in no distress. BMI: 33.52.  Eyes: No conjunctival inflammation or scleral icterus is present.  Oral exam:  Lips and gums are healthy appearing.There is no oropharyngeal erythema or exudate noted. Dental hygiene is good.  Heart:  Normal rate and regular rhythm. S1 and S2 normal without gallop, click, rub or other extra sounds    Lungs:Chest clear to auscultation; no wheezes, rhonchi,rales ,or rubs present.No increased work of breathing.   Abdomen: bowel sounds normal, soft and non-tender without masses, organomegaly or hernias noted.  No guarding or rebound.   Vascular : all pulses equal ; no bruits present.  Skin:Warm & dry.  Intact without suspicious lesions or rashes ; no  tenting or jaundice   Lymphatic: No lymphadenopathy is noted about the head, neck, axilla  Neuro: Strength, tone & DTRs normal.         Assessment & Plan:  See Current Assessment & Plan in Problem List under specific Diagnosis

## 2015-01-11 ENCOUNTER — Other Ambulatory Visit: Payer: Self-pay

## 2015-03-03 DIAGNOSIS — C61 Malignant neoplasm of prostate: Secondary | ICD-10-CM | POA: Diagnosis not present

## 2015-03-10 DIAGNOSIS — N5201 Erectile dysfunction due to arterial insufficiency: Secondary | ICD-10-CM | POA: Diagnosis not present

## 2015-03-10 DIAGNOSIS — N393 Stress incontinence (female) (male): Secondary | ICD-10-CM | POA: Diagnosis not present

## 2015-03-10 DIAGNOSIS — C61 Malignant neoplasm of prostate: Secondary | ICD-10-CM | POA: Diagnosis not present

## 2015-05-15 DIAGNOSIS — Z23 Encounter for immunization: Secondary | ICD-10-CM | POA: Diagnosis not present

## 2015-08-27 DIAGNOSIS — H4031X2 Glaucoma secondary to eye trauma, right eye, moderate stage: Secondary | ICD-10-CM | POA: Diagnosis not present

## 2015-08-27 DIAGNOSIS — H4322 Crystalline deposits in vitreous body, left eye: Secondary | ICD-10-CM | POA: Diagnosis not present

## 2015-08-27 DIAGNOSIS — H2513 Age-related nuclear cataract, bilateral: Secondary | ICD-10-CM | POA: Diagnosis not present

## 2015-09-10 DIAGNOSIS — C61 Malignant neoplasm of prostate: Secondary | ICD-10-CM | POA: Diagnosis not present

## 2015-09-17 DIAGNOSIS — C61 Malignant neoplasm of prostate: Secondary | ICD-10-CM | POA: Diagnosis not present

## 2015-09-17 DIAGNOSIS — Z0000 Encounter for general adult medical examination without abnormal findings: Secondary | ICD-10-CM | POA: Diagnosis not present

## 2015-09-17 DIAGNOSIS — N393 Stress incontinence (female) (male): Secondary | ICD-10-CM | POA: Diagnosis not present

## 2015-09-17 DIAGNOSIS — N5201 Erectile dysfunction due to arterial insufficiency: Secondary | ICD-10-CM | POA: Diagnosis not present

## 2016-01-06 ENCOUNTER — Ambulatory Visit (INDEPENDENT_AMBULATORY_CARE_PROVIDER_SITE_OTHER): Payer: Medicare Other | Admitting: Family

## 2016-01-06 ENCOUNTER — Encounter: Payer: Self-pay | Admitting: Family

## 2016-01-06 VITALS — BP 124/82 | HR 71 | Temp 97.6°F | Resp 16 | Ht 72.0 in | Wt 246.0 lb

## 2016-01-06 DIAGNOSIS — Z7289 Other problems related to lifestyle: Secondary | ICD-10-CM | POA: Diagnosis not present

## 2016-01-06 DIAGNOSIS — I10 Essential (primary) hypertension: Secondary | ICD-10-CM | POA: Diagnosis not present

## 2016-01-06 DIAGNOSIS — Z23 Encounter for immunization: Secondary | ICD-10-CM

## 2016-01-06 DIAGNOSIS — E782 Mixed hyperlipidemia: Secondary | ICD-10-CM | POA: Diagnosis not present

## 2016-01-06 DIAGNOSIS — Z0001 Encounter for general adult medical examination with abnormal findings: Secondary | ICD-10-CM | POA: Insufficient documentation

## 2016-01-06 DIAGNOSIS — Z0000 Encounter for general adult medical examination without abnormal findings: Secondary | ICD-10-CM

## 2016-01-06 MED ORDER — LOSARTAN POTASSIUM 100 MG PO TABS: 100.0000 mg | ORAL_TABLET | Freq: Every morning | ORAL | Status: DC

## 2016-01-06 MED ORDER — SIMVASTATIN 20 MG PO TABS: 20.0000 mg | ORAL_TABLET | Freq: Every day | ORAL | Status: DC

## 2016-01-06 NOTE — Progress Notes (Signed)
Subjective:    Patient ID: [name], male    DOB: 1945-11-29, 70 y.o.   MRN: [id number]  Chief Complaint  Patient presents with  . Establish Care    CPE, not fasting    HPI:  [name] is a 70 y.o. male who presents today for an annual wellness visit.   1) Health Maintenance -   Diet - Averages about 3 meals per consisting of chicken, beef, fish, fruits and vegetables. Caffeine 1-2 cups daily.  Exercise - Walk 3x per week.   2) Preventative Exams / Immunizations:  Dental -- Up to date  Vision --  Up to date   Health Maintenance  Topic Date Due  . Hepatitis C Screening  22-Feb-1946  . PNA vac Low Risk Adult (2 of 2 - PCV13) 08/13/2013  . ZOSTAVAX  12/18/2016 (Originally 12/27/2005)  . INFLUENZA VACCINE  02/15/2016  . COLONOSCOPY  09/05/2017  . TETANUS/TDAP  05/13/2020     Immunization History  Administered Date(s) Administered  . Influenza,inj,Quad PF,36+ Mos 07/01/2014  . Influenza-Unspecified 04/16/2013, 05/15/2015  . Pneumococcal Conjugate-13 01/06/2016  . Pneumococcal Polysaccharide-23 08/13/2012  . Td 05/13/2010    RISK FACTORS  Tobacco History  Smoking status  . Never Smoker   Smokeless tobacco  . Never Used     Cardiac risk factors: advanced age (older than 33 for men, 70 for women), dyslipidemia, hypertension and male gender.  Depression Screen  Q1: Over the past two weeks, have you felt down, depressed or hopeless? No  Q2: Over the past two weeks, have you felt little interest or pleasure in doing things? No  Have you lost interest or pleasure in daily life? No  Do you often feel hopeless? No  Do you cry easily over simple problems? No  Depression screen PHQ 2/9 01/06/2016  Decreased Interest 0  Down, Depressed, Hopeless 0  PHQ - 2 Score 0    Activities of Daily Living In your present state of health, do you have any difficulty performing the following activities?:  Driving? No Managing money?  No Feeding  yourself? No Getting from bed to chair? No Climbing a flight of stairs? No Preparing food and eating?: No Bathing or showering? No Getting dressed: No Getting to the toilet? No Using the toilet: No Moving around from place to place: No In the past year have you fallen or had a near fall?:No   Home Safety Has smoke detector and wears seat belts. No excess sun exposure. Are there smokers in your home (other than you)?  No Do you feel safe at home?  Yes  Hearing Difficulties: No Do you often ask people to speak up or repeat themselves? No Do you experience ringing or noises in your ears? No  Do you have difficulty understanding soft or whispered voices? No    Cognitive Testing  Alert? Yes   Normal Appearance? Yes  Oriented to person? Yes  Place? Yes   Time? Yes  Recall of three objects?  Yes  Can perform simple calculations? Yes  Displays appropriate judgment? Yes  Can read the correct time from a watch face? Yes  Do you feel that you have a problem with memory? No  Do you often misplace items? No   Advanced Directives have been discussed with the patient? Yes  Current Physicians/Providers and Suppliers  1. Terri Piedra, FNP - Internal Medicine 2. Dutch Gray, MD - Urology  Indicate any recent Medical Services you may have received from other  than [facility] providers in the past year (date may be approximate).  All answers were reviewed with the patient and necessary referrals were made:  Mauricio Po, [place]   01/06/2016    Allergies  Allergen Reactions  . Penicillins     Angioedema  . Lisinopril     cough  . Other Other (See Comments)    Spicy foods causes dyspepsia     Outpatient Prescriptions Prior to Visit  Medication Sig Dispense Refill  . aspirin 81 MG tablet Take 81 mg by mouth daily.    . Timolol Maleate 0.5 % (DAILY) SOLN Apply 0.5 % to eye daily.    Marland Kitchen losartan (COZAAR) 100 MG tablet Take 1 tablet (100 mg total) by mouth every morning. 90 tablet 3  .  simvastatin (ZOCOR) 20 MG tablet Take 1 tablet (20 mg total) by mouth at bedtime. 90 tablet 3   No facility-administered medications prior to visit.     Past Medical History  Diagnosis Date  . Hypertension   . Hyperlipidemia   . Glaucoma     OD only; from remote trauma (open angle)  . Allergy   . Prostate cancer ([place]) 10/16/12    Adenocarcinoma  . Erectile dysfunction   . Heart murmur   . Arthritis      Past Surgical History  Procedure Laterality Date  . Total knee arthroplasty  07/2010    Dr Maureen Ralphs  . Tonsillectomy      age 77 Dr.Yut  . Undescended testicle      age 71  . Colonoscopy  2009    Dr Deatra Ina; due 2019  . Robot assisted laparoscopic radical prostatectomy N/A 02/27/2013    Procedure: ROBOTIC ASSISTED LAPAROSCOPIC RADICAL PROSTATECTOMY LEVEL 2;  Surgeon: Dutch Gray, MD;  Location: [facility];  Service: Urology;  Laterality: N/A;  . Lymphadenectomy Bilateral 02/27/2013    Procedure: LYMPHADENECTOMY;  Surgeon: Dutch Gray, MD;  Location: [facility];  Service: Urology;  Laterality: Bilateral;     Family History  Problem Relation Age of Onset  . Heart attack Mother 12  . Stroke Father 89  . Osteoarthritis Brother   . Cancer Neg Hx   . Diabetes Neg Hx      Social History   Social History  . Marital Status: Married    Spouse Name: N/A  . Number of Children: 1  . Years of Education: 23   Occupational History  . AUDIOLOGIST    Social History Main Topics  . Smoking status: Never Smoker   . Smokeless tobacco: Never Used  . Alcohol Use: No  . Drug Use: No  . Sexual Activity: Not Currently   Other Topics Concern  . Not on file   Social History Narrative   Married audiologist   Plans to retire soon.     Review of Systems  Constitutional: Denies fever, chills, fatigue, or significant weight gain/loss. HENT: Head: Denies headache or neck pain Ears: Denies changes in hearing, ringing in ears, earache, drainage Nose: Denies discharge, stuffiness, itching,  nosebleed, sinus pain Throat: Denies sore throat, hoarseness, dry mouth, sores, thrush Eyes: Denies loss/changes in vision, pain, redness, blurry/double vision, flashing lights Cardiovascular: Denies chest pain/discomfort, tightness, palpitations, shortness of breath with activity, difficulty lying down, swelling, sudden awakening with shortness of breath Respiratory: Denies shortness of breath, cough, sputum production, wheezing Gastrointestinal: Denies dysphasia, heartburn, change in appetite, nausea, change in bowel habits, rectal bleeding, constipation, diarrhea, yellow skin or eyes Genitourinary: Denies frequency, urgency, burning/pain, blood in urine, incontinence,  change in urinary strength. Musculoskeletal: Denies muscle/joint pain, stiffness, back pain, redness or swelling of joints, trauma Skin: Denies rashes, lumps, itching, dryness, color changes, or hair/nail changes Neurological: Denies dizziness, fainting, seizures, weakness, numbness, tingling, tremor Psychiatric - Denies nervousness, stress, depression or memory loss Endocrine: Denies heat or cold intolerance, sweating, frequent urination, excessive thirst, changes in appetite Hematologic: Denies ease of bruising or bleeding    Objective:     BP 124/82 mmHg  Pulse 71  Temp(Src) 97.6 F (36.4 C) (Oral)  Resp 16  Ht 6' (1.829 m)  Wt 246 lb (111.585 kg)  BMI 33.36 kg/m2  SpO2 97% Nursing note and vital signs reviewed.  Physical Exam  Constitutional: He is oriented to person, place, and time. He appears well-developed and well-nourished.  HENT:  Head: Normocephalic.  Right Ear: Hearing, tympanic membrane, external ear and ear canal normal.  Left Ear: Hearing, tympanic membrane, external ear and ear canal normal.  Nose: Nose normal.  Mouth/Throat: Uvula is midline, oropharynx is clear and moist and mucous membranes are normal.  Eyes: Conjunctivae and EOM are normal. Pupils are equal, round, and reactive to light.    Neck: Neck supple. No JVD present. No tracheal deviation present. No thyromegaly present.  Cardiovascular: Normal rate, regular rhythm, normal heart sounds and intact distal pulses.   Pulmonary/Chest: Effort normal and breath sounds normal.  Abdominal: Soft. Bowel sounds are normal. He exhibits no distension and no mass. There is no tenderness. There is no rebound and no guarding.  Musculoskeletal: Normal range of motion. He exhibits no edema or tenderness.  Lymphadenopathy:    He has no cervical adenopathy.  Neurological: He is alert and oriented to person, place, and time. He has normal reflexes. No cranial nerve deficit. He exhibits normal muscle tone. Coordination normal.  Skin: Skin is warm and dry.  Psychiatric: He has a normal mood and affect. His behavior is normal. Judgment and thought content normal.       Assessment & Plan:   During the course of the visit the patient was educated and counseled about appropriate screening and preventive services including:    Pneumococcal vaccine   Influenza vaccine  Prostate cancer screening  Diabetes screening  Nutrition counseling   Diet review for nutrition referral? Yes ____  Not Indicated _X___   Patient Instructions (the written plan) was given to the patient.  Medicare Attestation I have personally reviewed: The patient's medical and social history Their use of alcohol, tobacco or illicit drugs Their current medications and supplements The patient's functional ability including ADLs,fall risks, home safety risks, cognitive, and hearing and visual impairment Diet and physical activities Evidence for depression or mood disorders  The patient's weight, height, BMI,  have been recorded in the chart.  I have made referrals, counseling, and provided education to the patient based on review of the above and I have provided the patient with a written personalized care plan for preventive services.     Mauricio Po,  FNP   01/06/2016    Problem List Items Addressed This Visit      Cardiovascular and Mediastinum   Essential hypertension   Relevant Medications   simvastatin (ZOCOR) 20 MG tablet   losartan (COZAAR) 100 MG tablet   Other Relevant Orders   CBC   Comprehensive metabolic panel     Other   HYPERLIPIDEMIA   Relevant Medications   simvastatin (ZOCOR) 20 MG tablet   losartan (COZAAR) 100 MG tablet   Other Relevant Orders  Comprehensive metabolic panel   Lipid panel   Medicare annual wellness visit, subsequent - Primary    Reviewed and updated patient's medical, surgical, family and social history. Medications and allergies were also reviewed. Basic screenings for depression, activities of daily living, hearing, cognition and safety were performed. Provider list was updated and health plan was provided to the patient.   1) Anticipatory Guidance: Discussed importance of wearing a seatbelt while driving and not texting while driving; changing batteries in smoke detector at least once annually; wearing suntan lotion when outside; eating a balanced and moderate diet; getting physical activity at least 30 minutes per day.  2) Immunizations / Screenings / Labs:  Prevnar updated today. Declines Zostavax. All other immunizations are up-to-date per recommendations. Obtain hepatitis C antibody for hepatitis C screening. All other screenings are up-to-date per recommendations. Obtain CBC, CMET, and Lipid profile.  Overall well exam with risk factors for cardiovascular disease including hypertension, hyperlipidemia and obesity. Hypertension and hyperlipidemia periodically controlled through medication regimen. Recommended weight loss of 5-10% of current body weight through nutrition and physical activity.Recommend increasing physical activity to 30 minutes of moderate level activity daily. Encourage nutritional intake that focuses on nutrient dense foods and is moderate, varied, and balanced and is low in  saturated fats and processed/sugary foods. Continue other healthy lifestyle behaviors and choices. Follow-up prevention exam in 1 year. Follow-up office visit for chronic conditions pending blood work as needed.       Other Visit Diagnoses    Other problems related to lifestyle        Relevant Orders    Hepatitis C antibody    Need for vaccination with 13-polyvalent pneumococcal conjugate vaccine        Relevant Orders    Pneumococcal conjugate vaccine 13-valent IM (Completed)

## 2016-01-06 NOTE — Patient Instructions (Addendum)
Thank you for choosing [facility].  Summary/Instructions:  Please continue to take your medications as prescribed.   Your prescription(s) have been submitted to your pharmacy or been printed and provided for you. Please take as directed and contact our office if you believe you are having problem(s) with the medication(s) or have any questions.  Please stop by the lab on the basement level of the building for your blood work. Your results will be released to [facility] (or called to you) after review, usually within 72 hours after test completion. If any changes need to be made, you will be notified at that same time.  Health Maintenance  Topic Date Due  . Hepatitis C Screening  11/08/1945  . PNA vac Low Risk Adult (2 of 2 - PCV13) 08/13/2013  . ZOSTAVAX  12/18/2016 (Originally 12/27/2005)  . INFLUENZA VACCINE  02/15/2016  . COLONOSCOPY  09/05/2017  . TETANUS/TDAP  05/13/2020      Health Maintenance, Male A healthy lifestyle and preventative care can promote health and wellness.  Maintain regular health, dental, and eye exams.  Eat a healthy diet. Foods like vegetables, fruits, whole grains, low-fat dairy products, and lean protein foods contain the nutrients you need and are low in calories. Decrease your intake of foods high in solid fats, added sugars, and salt. Get information about a proper diet from your health care provider, if necessary.  Regular physical exercise is one of the most important things you can do for your health. Most adults should get at least 150 minutes of moderate-intensity exercise (any activity that increases your heart rate and causes you to sweat) each week. In addition, most adults need muscle-strengthening exercises on 2 or more days a week.   Maintain a healthy weight. The body mass index (BMI) is a screening tool to identify possible weight problems. It provides an estimate of body fat based on height and weight. Your health care provider can find  your BMI and can help you achieve or maintain a healthy weight. For males 20 years and older:  A BMI below 18.5 is considered underweight.  A BMI of 18.5 to 24.9 is normal.  A BMI of 25 to 29.9 is considered overweight.  A BMI of 30 and above is considered obese.  Maintain normal blood lipids and cholesterol by exercising and minimizing your intake of saturated fat. Eat a balanced diet with plenty of fruits and vegetables. Blood tests for lipids and cholesterol should begin at age 30 and be repeated every 5 years. If your lipid or cholesterol levels are high, you are over age 46, or you are at high risk for heart disease, you may need your cholesterol levels checked more frequently.Ongoing high lipid and cholesterol levels should be treated with medicines if diet and exercise are not working.  If you smoke, find out from your health care provider how to quit. If you do not use tobacco, do not start.  Lung cancer screening is recommended for adults aged 73-80 years who are at high risk for developing lung cancer because of a history of smoking. A yearly low-dose CT scan of the lungs is recommended for people who have at least a 30-pack-year history of smoking and are current smokers or have quit within the past 15 years. A pack year of smoking is smoking an average of 1 pack of cigarettes a day for 1 year (for example, a 30-pack-year history of smoking could mean smoking 1 pack a day for 30 years or 2  packs a day for 15 years). Yearly screening should continue until the smoker has stopped smoking for at least 15 years. Yearly screening should be stopped for people who develop a health problem that would prevent them from having lung cancer treatment.  If you choose to drink alcohol, do not have more than 2 drinks per day. One drink is considered to be 12 oz (360 mL) of beer, 5 oz (150 mL) of wine, or 1.5 oz (45 mL) of liquor.  Avoid the use of street drugs. Do not share needles with anyone. Ask for  help if you need support or instructions about stopping the use of drugs.  High blood pressure causes heart disease and increases the risk of stroke. High blood pressure is more likely to develop in:  People who have blood pressure in the end of the normal range (100-139/85-89 mm Hg).  People who are overweight or obese.  People who are African American.  If you are 24-63 years of age, have your blood pressure checked every 3-5 years. If you are 70 years of age or older, have your blood pressure checked every year. You should have your blood pressure measured twice--once when you are at a hospital or clinic, and once when you are not at a hospital or clinic. Record the average of the two measurements. To check your blood pressure when you are not at a hospital or clinic, you can use:  An automated blood pressure machine at a pharmacy.  A home blood pressure monitor.  If you are 75-56 years old, ask your health care provider if you should take aspirin to prevent heart disease.  Diabetes screening involves taking a blood sample to check your fasting blood sugar level. This should be done once every 3 years after age 43 if you are at a normal weight and without risk factors for diabetes. Testing should be considered at a younger age or be carried out more frequently if you are overweight and have at least 1 risk factor for diabetes.  Colorectal cancer can be detected and often prevented. Most routine colorectal cancer screening begins at the age of 49 and continues through age 42. However, your health care provider may recommend screening at an earlier age if you have risk factors for colon cancer. On a yearly basis, your health care provider may provide home test kits to check for hidden blood in the stool. A small camera at the end of a tube may be used to directly examine the colon (sigmoidoscopy or colonoscopy) to detect the earliest forms of colorectal cancer. Talk to your health care provider  about this at age 19 when routine screening begins. A direct exam of the colon should be repeated every 5-10 years through age 28, unless early forms of precancerous polyps or small growths are found.  People who are at an increased risk for hepatitis B should be screened for this virus. You are considered at high risk for hepatitis B if:  You were born in a country where hepatitis B occurs often. Talk with your health care provider about which countries are considered high risk.  Your parents were born in a high-risk country and you have not received a shot to protect against hepatitis B (hepatitis B vaccine).  You have HIV or AIDS.  You use needles to inject street drugs.  You live with, or have sex with, someone who has hepatitis B.  You are a man who has sex with other men (MSM).  You get hemodialysis treatment.  You take certain medicines for conditions like cancer, organ transplantation, and autoimmune conditions.  Hepatitis C blood testing is recommended for all people born from [birth year] through 1965 and any individual with known risk factors for hepatitis C.  Healthy men should no longer receive prostate-specific antigen (PSA) blood tests as part of routine cancer screening. Talk to your health care provider about prostate cancer screening.  Testicular cancer screening is not recommended for adolescents or adult males who have no symptoms. Screening includes self-exam, a health care provider exam, and other screening tests. Consult with your health care provider about any symptoms you have or any concerns you have about testicular cancer.  Practice safe sex. Use condoms and avoid high-risk sexual practices to reduce the spread of sexually transmitted infections (STIs).  You should be screened for STIs, including gonorrhea and chlamydia if:  You are sexually active and are younger than 24 years.  You are older than 24 years, and your health care provider tells you that you are at  risk for this type of infection.  Your sexual activity has changed since you were last screened, and you are at an increased risk for chlamydia or gonorrhea. Ask your health care provider if you are at risk.  If you are at risk of being infected with HIV, it is recommended that you take a prescription medicine daily to prevent HIV infection. This is called pre-exposure prophylaxis (PrEP). You are considered at risk if:  You are a man who has sex with other men (MSM).  You are a heterosexual man who is sexually active with multiple partners.  You take drugs by injection.  You are sexually active with a partner who has HIV.  Talk with your health care provider about whether you are at high risk of being infected with HIV. If you choose to begin PrEP, you should first be tested for HIV. You should then be tested every 3 months for as long as you are taking PrEP.  Use sunscreen. Apply sunscreen liberally and repeatedly throughout the day. You should seek shade when your shadow is shorter than you. Protect yourself by wearing long sleeves, pants, a wide-brimmed hat, and sunglasses year round whenever you are outdoors.  Tell your health care provider of new moles or changes in moles, especially if there is a change in shape or color. Also, tell your health care provider if a mole is larger than the size of a pencil eraser.  A one-time screening for abdominal aortic aneurysm (AAA) and surgical repair of large AAAs by ultrasound is recommended for men aged 72-75 years who are current or former smokers.  Stay current with your vaccines (immunizations).   This information is not intended to replace advice given to you by your health care provider. Make sure you discuss any questions you have with your health care provider.   Document Released: 12/30/2007 Document Revised: 07/24/2014 Document Reviewed: 11/28/2010 Elsevier Interactive Patient Education [employer].

## 2016-01-06 NOTE — Progress Notes (Signed)
Pre visit review using our clinic review tool, if applicable. No additional management support is needed unless otherwise documented below in the visit note. 

## 2016-01-06 NOTE — Assessment & Plan Note (Addendum)
Reviewed and updated patient's medical, surgical, family and social history. Medications and allergies were also reviewed. Basic screenings for depression, activities of daily living, hearing, cognition and safety were performed. Provider list was updated and health plan was provided to the patient.   1) Anticipatory Guidance: Discussed importance of wearing a seatbelt while driving and not texting while driving; changing batteries in smoke detector at least once annually; wearing suntan lotion when outside; eating a balanced and moderate diet; getting physical activity at least 30 minutes per day.  2) Immunizations / Screenings / Labs:  Prevnar updated today. Declines Zostavax. All other immunizations are up-to-date per recommendations. Obtain hepatitis C antibody for hepatitis C screening. All other screenings are up-to-date per recommendations. Obtain CBC, CMET, and Lipid profile.  Overall well exam with risk factors for cardiovascular disease including hypertension, hyperlipidemia and obesity. Hypertension and hyperlipidemia periodically controlled through medication regimen. Recommended weight loss of 5-10% of current body weight through nutrition and physical activity.Recommend increasing physical activity to 30 minutes of moderate level activity daily. Encourage nutritional intake that focuses on nutrient dense foods and is moderate, varied, and balanced and is low in saturated fats and processed/sugary foods. Continue other healthy lifestyle behaviors and choices. Follow-up prevention exam in 1 year. Follow-up office visit for chronic conditions pending blood work as needed.

## 2016-01-11 ENCOUNTER — Other Ambulatory Visit (INDEPENDENT_AMBULATORY_CARE_PROVIDER_SITE_OTHER): Payer: Medicare Other

## 2016-01-11 DIAGNOSIS — I10 Essential (primary) hypertension: Secondary | ICD-10-CM

## 2016-01-11 DIAGNOSIS — E782 Mixed hyperlipidemia: Secondary | ICD-10-CM | POA: Diagnosis not present

## 2016-01-11 DIAGNOSIS — Z7289 Other problems related to lifestyle: Secondary | ICD-10-CM | POA: Diagnosis not present

## 2016-01-11 LAB — COMPREHENSIVE METABOLIC PANEL
ALBUMIN: 4.2 g/dL (ref 3.5–5.2)
ALT: 16 U/L (ref 0–53)
AST: 14 U/L (ref 0–37)
Alkaline Phosphatase: 93 U/L (ref 39–117)
BILIRUBIN TOTAL: 0.9 mg/dL (ref 0.2–1.2)
BUN: 19 mg/dL (ref 6–23)
CALCIUM: 9.4 mg/dL (ref 8.4–10.5)
CHLORIDE: 106 meq/L (ref 96–112)
CO2: 27 meq/L (ref 19–32)
CREATININE: 0.69 mg/dL (ref 0.40–1.50)
GFR: 120.47 mL/min (ref >60.00)
Glucose, Bld: 107 mg/dL — ABNORMAL HIGH (ref 70–99)
Potassium: 4.3 mEq/L (ref 3.5–5.1)
SODIUM: 139 meq/L (ref 135–145)
Total Protein: 6.9 g/dL (ref 6.0–8.3)

## 2016-01-11 LAB — LIPID PANEL
Cholesterol: 149 mg/dL (ref 0–200)
HDL: 33.7 mg/dL — ABNORMAL LOW (ref >39.00)
LDL Cholesterol: 92 mg/dL (ref 0–99)
NonHDL: 115.74
Total CHOL/HDL Ratio: 4
Triglycerides: 120 mg/dL (ref 0.0–149.0)
VLDL: 24 mg/dL (ref 0.0–40.0)

## 2016-01-11 LAB — CBC
HCT: 46.7 % (ref 39.0–52.0)
Hemoglobin: 16 g/dL (ref 13.0–17.0)
MCHC: 34.4 g/dL (ref 30.0–36.0)
MCV: 87.6 fl (ref 78.0–100.0)
PLATELETS: 193 10*3/uL (ref 150.0–400.0)
RBC: 5.33 Mil/uL (ref 4.22–5.81)
RDW: 13.1 % (ref 11.5–15.5)
WBC: 8.8 10*3/uL (ref 4.0–10.5)

## 2016-01-12 ENCOUNTER — Encounter: Payer: Self-pay | Admitting: Family

## 2016-01-12 LAB — HEPATITIS C ANTIBODY: HCV Ab: NEGATIVE

## 2016-03-09 DIAGNOSIS — C61 Malignant neoplasm of prostate: Secondary | ICD-10-CM | POA: Diagnosis not present

## 2016-03-16 DIAGNOSIS — C61 Malignant neoplasm of prostate: Secondary | ICD-10-CM | POA: Diagnosis not present

## 2016-03-16 DIAGNOSIS — N3946 Mixed incontinence: Secondary | ICD-10-CM | POA: Diagnosis not present

## 2016-03-24 DIAGNOSIS — H4031X2 Glaucoma secondary to eye trauma, right eye, moderate stage: Secondary | ICD-10-CM | POA: Diagnosis not present

## 2016-04-29 DIAGNOSIS — Z23 Encounter for immunization: Secondary | ICD-10-CM | POA: Diagnosis not present

## 2016-05-12 ENCOUNTER — Telehealth: Payer: Self-pay | Admitting: Family

## 2016-05-12 NOTE — Telephone Encounter (Signed)
error 

## 2016-06-23 DIAGNOSIS — H2513 Age-related nuclear cataract, bilateral: Secondary | ICD-10-CM | POA: Diagnosis not present

## 2016-06-23 DIAGNOSIS — H4031X2 Glaucoma secondary to eye trauma, right eye, moderate stage: Secondary | ICD-10-CM | POA: Diagnosis not present

## 2016-08-25 DIAGNOSIS — R3914 Feeling of incomplete bladder emptying: Secondary | ICD-10-CM | POA: Diagnosis not present

## 2016-08-25 DIAGNOSIS — R351 Nocturia: Secondary | ICD-10-CM | POA: Diagnosis not present

## 2016-08-25 DIAGNOSIS — N3946 Mixed incontinence: Secondary | ICD-10-CM | POA: Diagnosis not present

## 2016-08-25 DIAGNOSIS — R350 Frequency of micturition: Secondary | ICD-10-CM | POA: Diagnosis not present

## 2016-12-22 DIAGNOSIS — H2513 Age-related nuclear cataract, bilateral: Secondary | ICD-10-CM | POA: Diagnosis not present

## 2016-12-22 DIAGNOSIS — H4031X2 Glaucoma secondary to eye trauma, right eye, moderate stage: Secondary | ICD-10-CM | POA: Diagnosis not present

## 2016-12-22 DIAGNOSIS — H4322 Crystalline deposits in vitreous body, left eye: Secondary | ICD-10-CM | POA: Diagnosis not present

## 2016-12-28 DIAGNOSIS — N393 Stress incontinence (female) (male): Secondary | ICD-10-CM | POA: Diagnosis not present

## 2016-12-28 DIAGNOSIS — R350 Frequency of micturition: Secondary | ICD-10-CM | POA: Diagnosis not present

## 2016-12-28 DIAGNOSIS — N3946 Mixed incontinence: Secondary | ICD-10-CM | POA: Diagnosis not present

## 2017-02-09 ENCOUNTER — Ambulatory Visit (INDEPENDENT_AMBULATORY_CARE_PROVIDER_SITE_OTHER): Payer: Medicare Other | Admitting: Family

## 2017-02-09 ENCOUNTER — Encounter: Payer: Self-pay | Admitting: Family

## 2017-02-09 DIAGNOSIS — E782 Mixed hyperlipidemia: Secondary | ICD-10-CM

## 2017-02-09 DIAGNOSIS — C61 Malignant neoplasm of prostate: Secondary | ICD-10-CM

## 2017-02-09 DIAGNOSIS — I10 Essential (primary) hypertension: Secondary | ICD-10-CM

## 2017-02-09 MED ORDER — LOSARTAN POTASSIUM 100 MG PO TABS: 100.0000 mg | ORAL_TABLET | Freq: Every morning | ORAL | 3 refills | Status: DC

## 2017-02-09 MED ORDER — SIMVASTATIN 20 MG PO TABS: 20.0000 mg | ORAL_TABLET | Freq: Every day | ORAL | 3 refills | Status: DC

## 2017-02-09 NOTE — Progress Notes (Signed)
Subjective:    Patient ID: [name], male    DOB: [birth year], 71 y.o.   MRN: [id number]  Chief Complaint  Patient presents with  . Follow-up    losartan and simvastatin refill    HPI:  [name] is a 71 y.o. male who  has a past medical history of Allergy; Arthritis; Erectile dysfunction; Glaucoma; Heart murmur; Hyperlipidemia; Hypertension; and Prostate cancer ([place]) (10/16/12). and presents today for a follow up office visit.   1.) Hypertension - Currently maintained on losartan and reports taking the medications as prescribed and denies adverse side effects or hypotensive readings. Blood pressures at home have been well controlled. Denies changes in vision, worst headache of life or new symptoms of end organ damage. Working on following a low sodium diet.   BP Readings from Last 3 Encounters:  02/09/17 134/82  01/06/16 124/82  01/05/15 138/90    2.) Hyperlipidemia - Currently maintained on simvastatin. Reports taking the medication as prescribed and denies adverse side effects or myalgias. No chest pain, shortness of breath, or dyspnea on exertion.   Lab Results  Component Value Date   CHOL 149 01/11/2016   HDL 33.70 (L) 01/11/2016   LDLCALC 92 01/11/2016   TRIG 120.0 01/11/2016   CHOLHDL 4 01/11/2016     Allergies  Allergen Reactions  . Penicillins     Angioedema  . Lisinopril     cough  . Other Other (See Comments)    Spicy foods causes dyspepsia      Outpatient Medications Prior to Visit  Medication Sig Dispense Refill  . aspirin 81 MG tablet Take 81 mg by mouth daily.    . Timolol Maleate 0.5 % (DAILY) SOLN Apply 0.5 % to eye daily.    Marland Kitchen losartan (COZAAR) 100 MG tablet Take 1 tablet (100 mg total) by mouth every morning. 90 tablet 3  . simvastatin (ZOCOR) 20 MG tablet Take 1 tablet (20 mg total) by mouth at bedtime. 90 tablet 3  . solifenacin (VESICARE) 5 MG tablet Take 5 mg by mouth daily.     No facility-administered medications prior  to visit.      Past Medical History:  Diagnosis Date  . Allergy   . Arthritis   . Erectile dysfunction   . Glaucoma    OD only; from remote trauma (open angle)  . Heart murmur   . Hyperlipidemia   . Hypertension   . Prostate cancer ([place]) 10/16/12   Adenocarcinoma      Review of Systems  Constitutional: Negative for chills and fever.  Eyes:       Negative for changes in vision  Respiratory: Negative for cough, chest tightness and wheezing.   Cardiovascular: Negative for chest pain, palpitations and leg swelling.  Neurological: Negative for dizziness, weakness and light-headedness.      Objective:    BP 134/82 (BP Location: Left Arm, Patient Position: Sitting, Cuff Size: Large)   Pulse 74   Temp 98.8 F (37.1 C) (Oral)   Resp 16   Ht 6' (1.829 m)   Wt 253 lb (114.8 kg)   SpO2 95%   BMI 34.31 kg/m  Nursing note and vital signs reviewed.  Physical Exam  Constitutional: He is oriented to person, place, and time. He appears well-developed and well-nourished. No distress.  Cardiovascular: Normal rate, regular rhythm, normal heart sounds and intact distal pulses.   Pulmonary/Chest: Effort normal and breath sounds normal.  Neurological: He is alert and oriented to person, place,  and time.  Skin: Skin is warm and dry.  Psychiatric: He has a normal mood and affect. His behavior is normal. Judgment and thought content normal.       Assessment & Plan:   Problem List Items Addressed This Visit      Cardiovascular and Mediastinum   Essential hypertension    Hypertension well controlled and below goal 140/90 with current regimen and no adverse side effects. Denies worse headache of life with no new symptoms of end organ damage noted on physical exam. Continue current dosage of losartan. Monitor blood pressure at home and follow low-sodium diet.      Relevant Medications   simvastatin (ZOCOR) 20 MG tablet   losartan (COZAAR) 100 MG tablet   Other Relevant Orders    Comprehensive metabolic panel     Genitourinary   Prostate cancer ([place])    Stable with mild symptoms and followed by urology.        Other   HYPERLIPIDEMIA    Appears stable with no adverse side effects or myalgias. Not fasting today. Obtain lipid profile when fasting. Continue current dosage of simvastatin.      Relevant Medications   simvastatin (ZOCOR) 20 MG tablet   losartan (COZAAR) 100 MG tablet   Other Relevant Orders   Lipid Profile       I have discontinued [name]'s solifenacin. I am also having him maintain his aspirin, Timolol Maleate, simvastatin, and losartan.   Meds ordered this encounter  Medications  . simvastatin (ZOCOR) 20 MG tablet    Sig: Take 1 tablet (20 mg total) by mouth at bedtime.    Dispense:  90 tablet    Refill:  3    Order Specific Question:   Supervising Provider    Answer:   Pricilla Holm A [[id number]]  . losartan (COZAAR) 100 MG tablet    Sig: Take 1 tablet (100 mg total) by mouth every morning.    Dispense:  90 tablet    Refill:  3    Order Specific Question:   Supervising Provider    Answer:   Pricilla Holm A [[id number]]     Follow-up: Return in about 6 months (around 08/12/2017), or if symptoms worsen or fail to improve.   Mauricio Po, FNP

## 2017-02-09 NOTE — Assessment & Plan Note (Signed)
Hypertension well controlled and below goal 140/90 with current regimen and no adverse side effects. Denies worse headache of life with no new symptoms of end organ damage noted on physical exam. Continue current dosage of losartan. Monitor blood pressure at home and follow low-sodium diet.

## 2017-02-09 NOTE — Assessment & Plan Note (Signed)
Appears stable with no adverse side effects or myalgias. Not fasting today. Obtain lipid profile when fasting. Continue current dosage of simvastatin.

## 2017-02-09 NOTE — Patient Instructions (Signed)
Thank you for choosing [facility].  SUMMARY AND INSTRUCTIONS:  Please continue to take your medication as prescribed.  Continue to monitor your blood pressure at home and follow low sodium diet.  Schedule a wellness exam at your convenience.    Medication:  Your prescription(s) have been submitted to your pharmacy or been printed and provided for you. Please take as directed and contact our office if you believe you are having problem(s) with the medication(s) or have any questions.  Follow up:  If your symptoms worsen or fail to improve, please contact our office for further instruction, or in case of emergency go directly to the emergency room at the closest medical facility.

## 2017-02-09 NOTE — Assessment & Plan Note (Signed)
Stable with mild symptoms and followed by urology.

## 2017-03-02 DIAGNOSIS — H4031X2 Glaucoma secondary to eye trauma, right eye, moderate stage: Secondary | ICD-10-CM | POA: Diagnosis not present

## 2017-03-23 DIAGNOSIS — C61 Malignant neoplasm of prostate: Secondary | ICD-10-CM | POA: Diagnosis not present

## 2017-03-30 DIAGNOSIS — N5201 Erectile dysfunction due to arterial insufficiency: Secondary | ICD-10-CM | POA: Diagnosis not present

## 2017-03-30 DIAGNOSIS — R3914 Feeling of incomplete bladder emptying: Secondary | ICD-10-CM | POA: Diagnosis not present

## 2017-03-30 DIAGNOSIS — C61 Malignant neoplasm of prostate: Secondary | ICD-10-CM | POA: Diagnosis not present

## 2017-03-30 DIAGNOSIS — N393 Stress incontinence (female) (male): Secondary | ICD-10-CM | POA: Diagnosis not present

## 2017-03-30 DIAGNOSIS — N3946 Mixed incontinence: Secondary | ICD-10-CM | POA: Diagnosis not present

## 2017-04-13 DIAGNOSIS — H4031X2 Glaucoma secondary to eye trauma, right eye, moderate stage: Secondary | ICD-10-CM | POA: Diagnosis not present

## 2017-04-13 DIAGNOSIS — H2513 Age-related nuclear cataract, bilateral: Secondary | ICD-10-CM | POA: Diagnosis not present

## 2017-04-27 ENCOUNTER — Encounter: Payer: Self-pay | Admitting: Family

## 2017-04-27 ENCOUNTER — Other Ambulatory Visit (INDEPENDENT_AMBULATORY_CARE_PROVIDER_SITE_OTHER): Payer: Medicare Other

## 2017-04-27 ENCOUNTER — Ambulatory Visit (INDEPENDENT_AMBULATORY_CARE_PROVIDER_SITE_OTHER): Payer: Medicare Other | Admitting: Family

## 2017-04-27 VITALS — BP 150/92 | HR 71 | Temp 98.2°F | Resp 16 | Ht 72.0 in | Wt 259.0 lb

## 2017-04-27 DIAGNOSIS — E782 Mixed hyperlipidemia: Secondary | ICD-10-CM | POA: Diagnosis not present

## 2017-04-27 DIAGNOSIS — R7301 Impaired fasting glucose: Secondary | ICD-10-CM

## 2017-04-27 DIAGNOSIS — I10 Essential (primary) hypertension: Secondary | ICD-10-CM

## 2017-04-27 DIAGNOSIS — Z23 Encounter for immunization: Secondary | ICD-10-CM | POA: Diagnosis not present

## 2017-04-27 DIAGNOSIS — Z0000 Encounter for general adult medical examination without abnormal findings: Secondary | ICD-10-CM | POA: Diagnosis not present

## 2017-04-27 LAB — CBC
HCT: 48.7 % (ref 39.0–52.0)
Hemoglobin: 16.7 g/dL (ref 13.0–17.0)
MCHC: 34.2 g/dL (ref 30.0–36.0)
MCV: 88.8 fl (ref 78.0–100.0)
PLATELETS: 194 10*3/uL (ref 150.0–400.0)
RBC: 5.48 Mil/uL (ref 4.22–5.81)
RDW: 13.1 % (ref 11.5–15.5)
WBC: 6.1 10*3/uL (ref 4.0–10.5)

## 2017-04-27 LAB — LIPID PANEL
CHOL/HDL RATIO: 4
Cholesterol: 155 mg/dL (ref 0–200)
HDL: 36.7 mg/dL — ABNORMAL LOW (ref >39.00)
LDL CALC: 95 mg/dL (ref 0–99)
NONHDL: 118.34
Triglycerides: 116 mg/dL (ref 0.0–149.0)
VLDL: 23.2 mg/dL (ref 0.0–40.0)

## 2017-04-27 LAB — COMPREHENSIVE METABOLIC PANEL
ALT: 13 U/L (ref 0–53)
AST: 12 U/L (ref 0–37)
Albumin: 4.2 g/dL (ref 3.5–5.2)
Alkaline Phosphatase: 83 U/L (ref 39–117)
BILIRUBIN TOTAL: 1 mg/dL (ref 0.2–1.2)
BUN: 21 mg/dL (ref 6–23)
CALCIUM: 8.5 mg/dL (ref 8.4–10.5)
CO2: 27 meq/L (ref 19–32)
CREATININE: 0.75 mg/dL (ref 0.40–1.50)
Chloride: 105 mEq/L (ref 96–112)
GFR: 109.01 mL/min (ref >60.00)
GLUCOSE: 124 mg/dL — AB (ref 70–99)
Potassium: 4.5 mEq/L (ref 3.5–5.1)
Sodium: 139 mEq/L (ref 135–145)
Total Protein: 6.9 g/dL (ref 6.0–8.3)

## 2017-04-27 NOTE — Patient Instructions (Signed)
Thank you for choosing [facility].  SUMMARY AND INSTRUCTIONS:  Please continue to take your medications as prescribed.  We will check your blood work today.    Labs:  Please stop by the lab on the lower level of the building for your blood work. Your results will be released to [facility] (or called to you) after review, usually within 72 hours after test completion. If any changes need to be made, you will be notified at that same time.  1.) The lab is open from 7:30am to 5:30 pm Monday-Friday 2.) No appointment is necessary 3.) Fasting (if needed) is 6-8 hours after food and drink; black coffee and water are okay   Follow up:  If your symptoms worsen or fail to improve, please contact our office for further instruction, or in case of emergency go directly to the emergency room at the closest medical facility.   Health Maintenance  Topic Date Due  . INFLUENZA VACCINE  02/14/2017  . COLONOSCOPY  09/05/2017  . TETANUS/TDAP  05/13/2020  . Hepatitis C Screening  Completed  . PNA vac Low Risk Adult  Completed     Health Maintenance, Male A healthy lifestyle and preventive care is important for your health and wellness. Ask your health care provider about what schedule of regular examinations is right for you. What should I know about weight and diet? Eat a Healthy Diet  Eat plenty of vegetables, fruits, whole grains, low-fat dairy products, and lean protein.  Do not eat a lot of foods high in solid fats, added sugars, or salt.  Maintain a Healthy Weight Regular exercise can help you achieve or maintain a healthy weight. You should:  Do at least 150 minutes of exercise each week. The exercise should increase your heart rate and make you sweat (moderate-intensity exercise).  Do strength-training exercises at least twice a week.  Watch Your Levels of Cholesterol and Blood Lipids  Have your blood tested for lipids and cholesterol every 5 years starting at 71 years of  age. If you are at high risk for heart disease, you should start having your blood tested when you are 71 years old. You may need to have your cholesterol levels checked more often if: ? Your lipid or cholesterol levels are high. ? You are older than 71 years of age. ? You are at high risk for heart disease.  What should I know about cancer screening? Many types of cancers can be detected early and may often be prevented. Lung Cancer  You should be screened every year for lung cancer if: ? You are a current smoker who has smoked for at least 30 years. ? You are a former smoker who has quit within the past 15 years.  Talk to your health care provider about your screening options, when you should start screening, and how often you should be screened.  Colorectal Cancer  Routine colorectal cancer screening usually begins at 71 years of age and should be repeated every 5-10 years until you are 71 years old. You may need to be screened more often if early forms of precancerous polyps or small growths are found. Your health care provider may recommend screening at an earlier age if you have risk factors for colon cancer.  Your health care provider may recommend using home test kits to check for hidden blood in the stool.  A small camera at the end of a tube can be used to examine your colon (sigmoidoscopy or colonoscopy). This checks  for the earliest forms of colorectal cancer.  Prostate and Testicular Cancer  Depending on your age and overall health, your health care provider may do certain tests to screen for prostate and testicular cancer.  Talk to your health care provider about any symptoms or concerns you have about testicular or prostate cancer.  Skin Cancer  Check your skin from head to toe regularly.  Tell your health care provider about any new moles or changes in moles, especially if: ? There is a change in a mole's size, shape, or color. ? You have a mole that is larger than  a pencil eraser.  Always use sunscreen. Apply sunscreen liberally and repeat throughout the day.  Protect yourself by wearing long sleeves, pants, a wide-brimmed hat, and sunglasses when outside.  What should I know about heart disease, diabetes, and high blood pressure?  If you are 70-6 years of age, have your blood pressure checked every 3-5 years. If you are 13 years of age or older, have your blood pressure checked every year. You should have your blood pressure measured twice-once when you are at a hospital or clinic, and once when you are not at a hospital or clinic. Record the average of the two measurements. To check your blood pressure when you are not at a hospital or clinic, you can use: ? An automated blood pressure machine at a pharmacy. ? A home blood pressure monitor.  Talk to your health care provider about your target blood pressure.  If you are between 32-27 years old, ask your health care provider if you should take aspirin to prevent heart disease.  Have regular diabetes screenings by checking your fasting blood sugar level. ? If you are at a normal weight and have a low risk for diabetes, have this test once every three years after the age of 34. ? If you are overweight and have a high risk for diabetes, consider being tested at a younger age or more often.  A one-time screening for abdominal aortic aneurysm (AAA) by ultrasound is recommended for men aged 70-75 years who are current or former smokers. What should I know about preventing infection? Hepatitis B If you have a higher risk for hepatitis B, you should be screened for this virus. Talk with your health care provider to find out if you are at risk for hepatitis B infection. Hepatitis C Blood testing is recommended for:  Everyone born from [birth year] through 1965.  Anyone with known risk factors for hepatitis C.  Sexually Transmitted Diseases (STDs)  You should be screened each year for STDs including gonorrhea  and chlamydia if: ? You are sexually active and are younger than 71 years of age. ? You are older than 71 years of age and your health care provider tells you that you are at risk for this type of infection. ? Your sexual activity has changed since you were last screened and you are at an increased risk for chlamydia or gonorrhea. Ask your health care provider if you are at risk.  Talk with your health care provider about whether you are at high risk of being infected with HIV. Your health care provider may recommend a prescription medicine to help prevent HIV infection.  What else can I do?  Schedule regular health, dental, and eye exams.  Stay current with your vaccines (immunizations).  Do not use any tobacco products, such as cigarettes, chewing tobacco, and e-cigarettes. If you need help quitting, ask your health care provider.  Limit alcohol intake to no more than 2 drinks per day. One drink equals 12 ounces of beer, 5 ounces of wine, or 1 ounces of hard liquor.  Do not use street drugs.  Do not share needles.  Ask your health care provider for help if you need support or information about quitting drugs.  Tell your health care provider if you often feel depressed.  Tell your health care provider if you have ever been abused or do not feel safe at home. This information is not intended to replace advice given to you by your health care provider. Make sure you discuss any questions you have with your health care provider. Document Released: 12/30/2007 Document Revised: 03/01/2016 Document Reviewed: 04/06/2015 Elsevier Interactive Patient Education  [employer].

## 2017-04-27 NOTE — Assessment & Plan Note (Signed)
Blood pressure slightly elevated above goal 150/90 with no adverse side effects. Encouraged to continue current dosage of losartan and follow low-sodium diet. Monitor blood pressure at home. Denies worse headache of life with no new symptoms of end organ damage noted on physical exam. Obtain complete metabolic profile and CBC for therapeutic drug monitoring.

## 2017-04-27 NOTE — Progress Notes (Signed)
Subjective:    Patient ID: [name], male    DOB: 05/01/1946, 71 y.o.   MRN: [id number]  Chief Complaint  Patient presents with  . CPE    fasting    HPI:  [name] is a 71 y.o. male who presents today for a Medicare Annual Wellness/Physical exam.    1) Health Maintenance -   Diet - Averages about 3 meals per day consisting of a regular diet; Caffeine intake of 1-2 cups daily.   Exercise - No structured exercise secondary to incontinence   2) Preventative Exams / Immunizations:  Dental -- Up to date  Vision -- Up to date   Health Maintenance  Topic Date Due  . INFLUENZA VACCINE  02/14/2017  . COLONOSCOPY  09/05/2017  . TETANUS/TDAP  05/13/2020  . Hepatitis C Screening  Completed  . PNA vac Low Risk Adult  Completed     Immunization History  Administered Date(s) Administered  . Influenza, High Dose Seasonal PF 04/27/2017  . Influenza,inj,Quad PF,6+ Mos 07/01/2014  . Influenza-Unspecified 04/16/2013, 05/15/2015  . Pneumococcal Conjugate-13 01/06/2016  . Pneumococcal Polysaccharide-23 08/13/2012  . Td 05/13/2010    RISK FACTORS  Tobacco History  Smoking Status  . Never Smoker  Smokeless Tobacco  . Never Used     Cardiac risk factors: dyslipidemia, hypertension, male gender and obesity (BMI >= 30 kg/m2).  Depression Screen  Depression screen PHQ 2/9 04/27/2017  Decreased Interest 0  Down, Depressed, Hopeless 0  PHQ - 2 Score 0     Activities of Daily Living In your present state of health, do you have any difficulty performing the following activities?:  Driving? No Managing money?  No Feeding yourself? No Getting from bed to chair? No Climbing a flight of stairs? No Preparing food and eating?: No Bathing or showering? No Getting dressed: No Getting to the toilet? No Using the toilet: No Moving around from place to place: No In the past year have you fallen or had a near fall?:No   Home Safety Has smoke detector and  wears seat belts. No excess sun exposure. Are there smokers in your home (other than you)?  No Do you feel safe at home?  Yes  Hearing Difficulties: No Do you often ask people to speak up or repeat themselves? No Do you experience ringing or noises in your ears? No  Do you have difficulty understanding soft or whispered voices? No    Cognitive Testing  Alert? Yes   Normal Appearance? Yes  Oriented to person? Yes  Place? Yes   Time? Yes  Recall of three objects?  Yes  Can perform simple calculations? Yes  Displays appropriate judgment? Yes  Can read the correct time from a watch face? Yes  Do you feel that you have a problem with memory? No  Do you often misplace items? No   Advanced Directives have been discussed with the patient? Yes   Current Physicians/Providers and Suppliers  1. Terri Piedra, FNP - Internal Medicine 2. Beatrix Fetters, MD - Opthalmology  Indicate any recent Medical Services you may have received from other than [facility] providers in the past year (date may be approximate).  All answers were reviewed with the patient and necessary referrals were made:  Mauricio Po, [place]   04/27/2017    Allergies  Allergen Reactions  . Penicillins     Angioedema  . Lisinopril     cough  . Other Other (See Comments)    Spicy foods causes  dyspepsia     Outpatient Medications Prior to Visit  Medication Sig Dispense Refill  . aspirin 81 MG tablet Take 81 mg by mouth daily.    Marland Kitchen losartan (COZAAR) 100 MG tablet Take 1 tablet (100 mg total) by mouth every morning. 90 tablet 3  . simvastatin (ZOCOR) 20 MG tablet Take 1 tablet (20 mg total) by mouth at bedtime. 90 tablet 3  . Timolol Maleate 0.5 % (DAILY) SOLN Apply 0.5 % to eye daily.     No facility-administered medications prior to visit.      Past Medical History:  Diagnosis Date  . Allergy   . Arthritis   . Erectile dysfunction   . Glaucoma    OD only; from remote trauma (open angle)  . Heart murmur   .  Hyperlipidemia   . Hypertension   . Prostate cancer ([place]) 10/16/12   Adenocarcinoma     Past Surgical History:  Procedure Laterality Date  . COLONOSCOPY  2009   Dr Deatra Ina; due 2019  . LYMPHADENECTOMY Bilateral 02/27/2013   Procedure: LYMPHADENECTOMY;  Surgeon: Dutch Gray, MD;  Location: [facility];  Service: Urology;  Laterality: Bilateral;  . ROBOT ASSISTED LAPAROSCOPIC RADICAL PROSTATECTOMY N/A 02/27/2013   Procedure: ROBOTIC ASSISTED LAPAROSCOPIC RADICAL PROSTATECTOMY LEVEL 2;  Surgeon: Dutch Gray, MD;  Location: [facility];  Service: Urology;  Laterality: N/A;  . TONSILLECTOMY     age 62 Dr.Yut  . TOTAL KNEE ARTHROPLASTY  07/2010   Dr Maureen Ralphs  . undescended testicle     age 60     Family History  Problem Relation Age of Onset  . Heart attack Mother 47  . Stroke Father 87  . Osteoarthritis Brother   . Cancer Neg Hx   . Diabetes Neg Hx      Social History   Social History  . Marital status: Married    Spouse name: N/A  . Number of children: 1  . Years of education: 63   Occupational History  . AUDIOLOGIST [employer]   Social History Main Topics  . Smoking status: Never Smoker  . Smokeless tobacco: Never Used  . Alcohol use No  . Drug use: No  . Sexual activity: Not Currently   Other Topics Concern  . Not on file   Social History Narrative   Married audiologist   Plans to retire soon.     Review of Systems  Constitutional: Denies fever, chills, fatigue, or significant weight gain/loss. HENT: Head: Denies headache or neck pain Ears: Denies changes in hearing, ringing in ears, earache, drainage Nose: Denies discharge, stuffiness, itching, nosebleed, sinus pain Throat: Denies sore throat, hoarseness, dry mouth, sores, thrush Eyes: Denies loss/changes in vision, pain, redness, blurry/double vision, flashing lights Cardiovascular: Denies chest pain/discomfort, tightness, palpitations, shortness of breath with activity, difficulty lying down, swelling, sudden  awakening with shortness of breath Respiratory: Denies shortness of breath, cough, sputum production, wheezing Gastrointestinal: Denies dysphasia, heartburn, change in appetite, nausea, change in bowel habits, rectal bleeding, constipation, diarrhea, yellow skin or eyes Genitourinary: Denies frequency, urgency, burning/pain, blood in urine, incontinence, change in urinary strength. Musculoskeletal: Denies muscle/joint pain, stiffness, back pain, redness or swelling of joints, trauma Skin: Denies rashes, lumps, itching, dryness, color changes, or hair/nail changes Neurological: Denies dizziness, fainting, seizures, weakness, numbness, tingling, tremor Psychiatric - Denies nervousness, stress, depression or memory loss Endocrine: Denies heat or cold intolerance, sweating, frequent urination, excessive thirst, changes in appetite Hematologic: Denies ease of bruising or bleeding    Objective:  BP (!) 150/92 (BP Location: Left Arm, Patient Position: Sitting, Cuff Size: Large)   Pulse 71   Temp 98.2 F (36.8 C) (Oral)   Resp 16   Ht 6' (1.829 m)   Wt 259 lb (117.5 kg)   SpO2 97%   BMI 35.13 kg/m  Nursing note and vital signs reviewed.  Physical Exam  Constitutional: He is oriented to person, place, and time. He appears well-developed and well-nourished. No distress.  Cardiovascular: Normal rate, regular rhythm, normal heart sounds and intact distal pulses.   Pulmonary/Chest: Breath sounds normal. No respiratory distress. He has no wheezes. He has no rales. He exhibits no tenderness.  Neurological: He is alert and oriented to person, place, and time.  Skin: Skin is warm and dry.  Psychiatric: He has a normal mood and affect. His behavior is normal. Judgment and thought content normal.       Assessment & Plan:   During the course of the visit the patient was educated and counseled about appropriate screening and preventive services including:    Pneumococcal vaccine   Prostate  cancer screening  Colorectal cancer screening  Diabetes screening  Glaucoma screening  Nutrition counseling   Diet review for nutrition referral? Yes ____  Not Indicated _X___   Patient Instructions (the written plan) was given to the patient.  Medicare Attestation I have personally reviewed: The patient's medical and social history Their use of alcohol, tobacco or illicit drugs Their current medications and supplements The patient's functional ability including ADLs,fall risks, home safety risks, cognitive, and hearing and visual impairment Diet and physical activities Evidence for depression or mood disorders  The patient's weight, height, BMI,  have been recorded in the chart.  I have made referrals, counseling, and provided education to the patient based on review of the above and I have provided the patient with a written personalized care plan for preventive services.     Problem List Items Addressed This Visit      Cardiovascular and Mediastinum   Essential hypertension    Blood pressure slightly elevated above goal 150/90 with no adverse side effects. Encouraged to continue current dosage of losartan and follow low-sodium diet. Monitor blood pressure at home. Denies worse headache of life with no new symptoms of end organ damage noted on physical exam. Obtain complete metabolic profile and CBC for therapeutic drug monitoring.       Relevant Orders   CBC   Comprehensive metabolic panel     Other   HYPERLIPIDEMIA    Stable with current medication regimen and no adverse side effects or myalgias. Obtain lipid profile. Continue current dosage of simvastatin pending lipid profile results.      Relevant Orders   Lipid panel   Medicare annual wellness visit, subsequent - Primary    Reviewed and updated patient's medical, surgical, family and social history. Medications and allergies were also reviewed. Basic screenings for depression, activities of daily living, hearing,  cognition and safety were performed. Provider list was updated and health plan was provided to the patient.   Overall well exam with risk factors for cardiovascular disease including hyperlipidemia and hypertension. Chronic conditions appear adequately maintained with current medication regimen and no adverse side effects. Prostate cancer continues to be monitored by urology. All other screenings are up-to-date per recommendations. Continue current healthy lifestyle behaviors and choices. Follow-up prevention exam in 1 year. Follow-up office visit pending blood work and for chronic conditions as needed.       Other Visit  Diagnoses    Need for influenza vaccination       Relevant Orders   Flu vaccine HIGH DOSE PF (Fluzone High dose) (Completed)       I have discontinued Mr. Belk's Timolol Maleate. I am also having him maintain his aspirin, simvastatin, losartan, and dorzolamide-timolol.   Meds ordered this encounter  Medications  . dorzolamide-timolol (COSOPT) 22.3-6.8 MG/ML ophthalmic solution    Sig: 1 drop 2 (two) times daily.     Follow-up: No Follow-up on file.   Mauricio Po, FNP

## 2017-04-27 NOTE — Assessment & Plan Note (Signed)
Stable with current medication regimen and no adverse side effects or myalgias. Obtain lipid profile. Continue current dosage of simvastatin pending lipid profile results.

## 2017-04-27 NOTE — Assessment & Plan Note (Signed)
Reviewed and updated patient's medical, surgical, family and social history. Medications and allergies were also reviewed. Basic screenings for depression, activities of daily living, hearing, cognition and safety were performed. Provider list was updated and health plan was provided to the patient.   Overall well exam with risk factors for cardiovascular disease including hyperlipidemia and hypertension. Chronic conditions appear adequately maintained with current medication regimen and no adverse side effects. Prostate cancer continues to be monitored by urology. All other screenings are up-to-date per recommendations. Continue current healthy lifestyle behaviors and choices. Follow-up prevention exam in 1 year. Follow-up office visit pending blood work and for chronic conditions as needed.

## 2017-05-09 ENCOUNTER — Telehealth: Payer: Self-pay | Admitting: Family

## 2017-05-09 NOTE — Telephone Encounter (Signed)
Printed out immunization report and let wife know. She stated that she will pick it up at the front desk.

## 2017-05-09 NOTE — Telephone Encounter (Signed)
Pt is needing verification of his recent flu shot for his job. Is there something that we can mail to him? Please advise.

## 2017-07-23 DIAGNOSIS — H4031X2 Glaucoma secondary to eye trauma, right eye, moderate stage: Secondary | ICD-10-CM | POA: Diagnosis not present

## 2017-09-22 ENCOUNTER — Encounter: Payer: Self-pay | Admitting: Gastroenterology

## 2017-10-05 ENCOUNTER — Other Ambulatory Visit (INDEPENDENT_AMBULATORY_CARE_PROVIDER_SITE_OTHER): Payer: Medicare HMO

## 2017-10-05 DIAGNOSIS — R7301 Impaired fasting glucose: Secondary | ICD-10-CM

## 2017-10-05 LAB — HEMOGLOBIN A1C: Hgb A1c MFr Bld: 5.5 % (ref 4.6–6.5)

## 2017-12-07 ENCOUNTER — Encounter: Payer: Self-pay | Admitting: Nurse Practitioner

## 2017-12-07 ENCOUNTER — Ambulatory Visit (INDEPENDENT_AMBULATORY_CARE_PROVIDER_SITE_OTHER): Payer: Medicare HMO | Admitting: Nurse Practitioner

## 2017-12-07 VITALS — BP 144/82 | HR 75 | Temp 98.8°F | Resp 16 | Ht 72.0 in | Wt 255.0 lb

## 2017-12-07 DIAGNOSIS — I10 Essential (primary) hypertension: Secondary | ICD-10-CM | POA: Diagnosis not present

## 2017-12-07 DIAGNOSIS — Z0000 Encounter for general adult medical examination without abnormal findings: Secondary | ICD-10-CM | POA: Diagnosis not present

## 2017-12-07 DIAGNOSIS — E782 Mixed hyperlipidemia: Secondary | ICD-10-CM

## 2017-12-07 MED ORDER — LOSARTAN POTASSIUM 100 MG PO TABS: 100.0000 mg | ORAL_TABLET | Freq: Every morning | ORAL | 3 refills | Status: DC

## 2017-12-07 MED ORDER — SIMVASTATIN 20 MG PO TABS: 20.0000 mg | ORAL_TABLET | Freq: Every day | ORAL | 3 refills | Status: DC

## 2017-12-07 NOTE — Patient Instructions (Addendum)
Please try to check your blood pressure once daily or at least a few times a week, at the same time each day, and keep a log. Let me know if you are getting readings over 140/90.  I will plan to see you back in about 6 months for your annual physical with fasting lab work.  It was nice to meet you. Thanks for letting me take care of you today.   DASH Eating Plan DASH stands for "Dietary Approaches to Stop Hypertension." The DASH eating plan is a healthy eating plan that has been shown to reduce high blood pressure (hypertension). It may also reduce your risk for type 2 diabetes, heart disease, and stroke. The DASH eating plan may also help with weight loss. What are tips for following this plan? General guidelines  Avoid eating more than 2,300 mg (milligrams) of salt (sodium) a day. If you have hypertension, you may need to reduce your sodium intake to 1,500 mg a day.  Limit alcohol intake to no more than 1 drink a day for nonpregnant women and 2 drinks a day for men. One drink equals 12 oz of beer, 5 oz of wine, or 1 oz of hard liquor.  Work with your health care provider to maintain a healthy body weight or to lose weight. Ask what an ideal weight is for you.  Get at least 30 minutes of exercise that causes your heart to beat faster (aerobic exercise) most days of the week. Activities may include walking, swimming, or biking.  Work with your health care provider or diet and nutrition specialist (dietitian) to adjust your eating plan to your individual calorie needs. Reading food labels  Check food labels for the amount of sodium per serving. Choose foods with less than 5 percent of the Daily Value of sodium. Generally, foods with less than 300 mg of sodium per serving fit into this eating plan.  To find whole grains, look for the word "whole" as the first word in the ingredient list. Shopping  Buy products labeled as "low-sodium" or "no salt added."  Buy fresh foods. Avoid canned  foods and premade or frozen meals. Cooking  Avoid adding salt when cooking. Use salt-free seasonings or herbs instead of table salt or sea salt. Check with your health care provider or pharmacist before using salt substitutes.  Do not fry foods. Cook foods using healthy methods such as baking, boiling, grilling, and broiling instead.  Cook with heart-healthy oils, such as olive, canola, soybean, or sunflower oil. Meal planning   Eat a balanced diet that includes: ? 5 or more servings of fruits and vegetables each day. At each meal, try to fill half of your plate with fruits and vegetables. ? Up to 6-8 servings of whole grains each day. ? Less than 6 oz of lean meat, poultry, or fish each day. A 3-oz serving of meat is about the same size as a deck of cards. One egg equals 1 oz. ? 2 servings of low-fat dairy each day. ? A serving of nuts, seeds, or beans 5 times each week. ? Heart-healthy fats. Healthy fats called Omega-3 fatty acids are found in foods such as flaxseeds and coldwater fish, like sardines, salmon, and mackerel.  Limit how much you eat of the following: ? Canned or prepackaged foods. ? Food that is high in trans fat, such as fried foods. ? Food that is high in saturated fat, such as fatty meat. ? Sweets, desserts, sugary drinks, and other foods with  added sugar. ? Full-fat dairy products.  Do not salt foods before eating.  Try to eat at least 2 vegetarian meals each week.  Eat more home-cooked food and less restaurant, buffet, and fast food.  When eating at a restaurant, ask that your food be prepared with less salt or no salt, if possible. What foods are recommended? The items listed may not be a complete list. Talk with your dietitian about what dietary choices are best for you. Grains Whole-grain or whole-wheat bread. Whole-grain or whole-wheat pasta. Brown rice. [place]. Bulgur. Whole-grain and low-sodium cereals. Pita bread. Low-fat, low-sodium crackers.  Whole-wheat flour tortillas. Vegetables Fresh or frozen vegetables (raw, steamed, roasted, or grilled). Low-sodium or reduced-sodium tomato and vegetable juice. Low-sodium or reduced-sodium tomato sauce and tomato paste. Low-sodium or reduced-sodium canned vegetables. Fruits All fresh, dried, or frozen fruit. Canned fruit in natural juice (without added sugar). Meat and other protein foods Skinless chicken or [place]. Ground chicken or [place]. Pork with fat trimmed off. Fish and seafood. Egg whites. Dried beans, peas, or lentils. Unsalted nuts, nut butters, and seeds. Unsalted canned beans. Lean cuts of beef with fat trimmed off. Low-sodium, lean deli meat. Dairy Low-fat (1%) or fat-free (skim) milk. Fat-free, low-fat, or reduced-fat cheeses. Nonfat, low-sodium ricotta or cottage cheese. Low-fat or nonfat yogurt. Low-fat, low-sodium cheese. Fats and oils Soft margarine without trans fats. Vegetable oil. Low-fat, reduced-fat, or light mayonnaise and salad dressings (reduced-sodium). Canola, safflower, olive, soybean, and sunflower oils. Avocado. Seasoning and other foods Herbs. Spices. Seasoning mixes without salt. Unsalted popcorn and pretzels. Fat-free sweets. What foods are not recommended? The items listed may not be a complete list. Talk with your dietitian about what dietary choices are best for you. Grains Baked goods made with fat, such as croissants, muffins, or some breads. Dry pasta or rice meal packs. Vegetables Creamed or fried vegetables. Vegetables in a cheese sauce. Regular canned vegetables (not low-sodium or reduced-sodium). Regular canned tomato sauce and paste (not low-sodium or reduced-sodium). Regular tomato and vegetable juice (not low-sodium or reduced-sodium). Angie Fava. Olives. Fruits Canned fruit in a light or heavy syrup. Fried fruit. Fruit in cream or butter sauce. Meat and other protein foods Fatty cuts of meat. Ribs. Fried meat. Berniece Salines. Sausage. Bologna and other  processed lunch meats. Salami. Fatback. Hotdogs. Bratwurst. Salted nuts and seeds. Canned beans with added salt. Canned or smoked fish. Whole eggs or egg yolks. Chicken or [place] with skin. Dairy Whole or 2% milk, cream, and half-and-half. Whole or full-fat cream cheese. Whole-fat or sweetened yogurt. Full-fat cheese. Nondairy creamers. Whipped toppings. Processed cheese and cheese spreads. Fats and oils Butter. Stick margarine. Lard. Shortening. Ghee. Bacon fat. Tropical oils, such as coconut, palm kernel, or palm oil. Seasoning and other foods Salted popcorn and pretzels. Onion salt, garlic salt, seasoned salt, table salt, and sea salt. Worcestershire sauce. Tartar sauce. Barbecue sauce. Teriyaki sauce. Soy sauce, including reduced-sodium. Steak sauce. Canned and packaged gravies. Fish sauce. Oyster sauce. Cocktail sauce. Horseradish that you find on the shelf. Ketchup. Mustard. Meat flavorings and tenderizers. Bouillon cubes. Hot sauce and Tabasco sauce. Premade or packaged marinades. Premade or packaged taco seasonings. Relishes. Regular salad dressings. Where to find more information:  National Heart, Lung, and [place]: https://wilson-eaton.com/  American Heart Association: www.heart.org Summary  The DASH eating plan is a healthy eating plan that has been shown to reduce high blood pressure (hypertension). It may also reduce your risk for type 2 diabetes, heart disease, and stroke.  With the DASH  eating plan, you should limit salt (sodium) intake to 2,300 mg a day. If you have hypertension, you may need to reduce your sodium intake to 1,500 mg a day.  When on the DASH eating plan, aim to eat more fresh fruits and vegetables, whole grains, lean proteins, low-fat dairy, and heart-healthy fats.  Work with your health care provider or diet and nutrition specialist (dietitian) to adjust your eating plan to your individual calorie needs. This information is not intended to replace advice given to  you by your health care provider. Make sure you discuss any questions you have with your health care provider. Document Released: 06/22/2011 Document Revised: 06/26/2016 Document Reviewed: 06/26/2016 Elsevier Interactive Patient Education  [employer].

## 2017-12-07 NOTE — Assessment & Plan Note (Signed)
Stable Continue current meds Labs are up to date RTC in 6 months for CPE, annual labs - simvastatin (ZOCOR) 20 MG tablet; Take 1 tablet (20 mg total) by mouth at bedtime.  Dispense: 90 tablet; Refill: 3

## 2017-12-07 NOTE — Progress Notes (Signed)
Name: [name]   MRN: [id number]    DOB: Nov 13, 1945   Date:12/07/2017       Progress Note  Subjective  Chief Complaint  Chief Complaint  Patient presents with  . Establish Care    med refills    HPI Mr Forstrom is requesting a refill of his losartan and simvastatin today. He is an independent 72 yo, He is currently also following with urology for active prostate cancer. He says he overall feels well and denies any complaints today.  Hypertension -maintained on losartan 100 mg daily Reports daily medication compliance without adverse medication effects. Reports he occasionally checks his blood pressure at home and readings are normal, historically his blood pressure is higher in clinical setting. Denies headaches, vision changes, chest pain, shortness of breath, edema.  BP Readings from Last 3 Encounters:  12/07/17 (!) 144/82  04/27/17 (!) 150/92  02/09/17 134/82    Cholesterol- maintained on simvastatin 20 daily Reports daily medication compliance without noted adverse medication effects including myalgias, nausea. No history of MI, CVA.  Lab Results  Component Value Date   CHOL 155 04/27/2017   HDL 36.70 (L) 04/27/2017   LDLCALC 95 04/27/2017   TRIG 116.0 04/27/2017   CHOLHDL 4 04/27/2017     Patient Active Problem List   Diagnosis Date Noted  . Medicare annual wellness visit, subsequent 01/06/2016  . Prostate cancer ([place]) 10/16/2012  . DEGENERATIVE JOINT DISEASE, BOTH KNEES, SEVERE 02/17/2010  . Essential hypertension 01/07/2009  . Diverticulosis of large intestine 01/07/2009  . Hyperglycemia 01/07/2009  . GLAUCOMA, PRIMARY OPEN-ANGLE 02/25/2008  . ARTHRITIS 02/25/2008  . HYPERLIPIDEMIA 07/26/2007  . GERD 07/26/2007    Past Surgical History:  Procedure Laterality Date  . COLONOSCOPY  2009   Dr Deatra Ina; due 2019  . LYMPHADENECTOMY Bilateral 02/27/2013   Procedure: LYMPHADENECTOMY;  Surgeon: Dutch Gray, MD;  Location: [facility];  Service: Urology;   Laterality: Bilateral;  . ROBOT ASSISTED LAPAROSCOPIC RADICAL PROSTATECTOMY N/A 02/27/2013   Procedure: ROBOTIC ASSISTED LAPAROSCOPIC RADICAL PROSTATECTOMY LEVEL 2;  Surgeon: Dutch Gray, MD;  Location: [facility];  Service: Urology;  Laterality: N/A;  . TONSILLECTOMY     age 66 Dr.Yut  . TOTAL KNEE ARTHROPLASTY  07/2010   Dr Maureen Ralphs  . undescended testicle     age 76    Family History  Problem Relation Age of Onset  . Heart attack Mother 70  . Stroke Father 48  . Osteoarthritis Brother   . Cancer Neg Hx   . Diabetes Neg Hx     Social History   Socioeconomic History  . Marital status: Married    Spouse name: Not on file  . Number of children: 1  . Years of education: 39  . Highest education level: Not on file  Occupational History  . Occupation: AUDIOLOGIST    Employer: [employer]  Social Needs  . Financial resource strain: Not on file  . Food insecurity:    Worry: Not on file    Inability: Not on file  . Transportation needs:    Medical: Not on file    Non-medical: Not on file  Tobacco Use  . Smoking status: Never Smoker  . Smokeless tobacco: Never Used  Substance and Sexual Activity  . Alcohol use: No  . Drug use: No  . Sexual activity: Not Currently  Lifestyle  . Physical activity:    Days per week: Not on file    Minutes per session: Not on file  . Stress: Not  on file  Relationships  . Social connections:    Talks on phone: Not on file    Gets together: Not on file    Attends religious service: Not on file    Active member of club or organization: Not on file    Attends meetings of clubs or organizations: Not on file    Relationship status: Not on file  . Intimate partner violence:    Fear of current or ex partner: Not on file    Emotionally abused: Not on file    Physically abused: Not on file    Forced sexual activity: Not on file  Other Topics Concern  . Not on file  Social History Narrative   Married audiologist   Plans to retire soon.      Current Outpatient Medications:  .  aspirin 81 MG tablet, Take 81 mg by mouth daily., Disp: , Rfl:  .  dorzolamide-timolol (COSOPT) 22.3-6.8 MG/ML ophthalmic solution, 1 drop 2 (two) times daily., Disp: , Rfl:  .  losartan (COZAAR) 100 MG tablet, Take 1 tablet (100 mg total) by mouth every morning., Disp: 90 tablet, Rfl: 3 .  simvastatin (ZOCOR) 20 MG tablet, Take 1 tablet (20 mg total) by mouth at bedtime., Disp: 90 tablet, Rfl: 3  Allergies  Allergen Reactions  . Penicillins     Angioedema  . Lisinopril     cough  . Other Other (See Comments)    Spicy foods causes dyspepsia     ROS See HPI  Objective  Vitals:   12/07/17 0807  BP: (!) 144/82  Pulse: 75  Resp: 16  Temp: 98.8 F (37.1 C)  TempSrc: Oral  SpO2: 96%  Weight: 255 lb (115.7 kg)  Height: 6' (1.829 m)    Body mass index is 34.58 kg/m.  Physical Exam Vital signs reviewed. Constitutional: Patient appears well-developed and well-nourished. No distress.  HENT: Head: Normocephalic and atraumatic. Nose: Nose normal. Mouth/Throat: Oropharynx is clear and moist. No oropharyngeal exudate.  Eyes: Conjunctivae and EOM are normal. Pupils are equal, round, and reactive to light. No scleral icterus.  Neck: Normal range of motion. Neck supple.  Cardiovascular: Normal rate, regular rhythm and normal heart sounds. No BLE edema. Distal pulses intact. Pulmonary/Chest: Effort normal and breath sounds normal. No respiratory distress. Neurological: He is alert and oriented to person, place, and time. Coordination, balance, strength, speech and gait are normal.  Skin: Skin is warm and dry. No rash noted. No erythema.  Psychiatric: Patient has a normal mood and affect. behavior is normal. Judgment and thought content normal.  Assessment & Plan RTC in about 6 months for CPE, fasting lab work CBC, CMET, lipid panel 04/27/2017 WNL A1c WNL 10/05/17

## 2017-12-07 NOTE — Assessment & Plan Note (Signed)
Mildly elevated reading with normal readings at home Per patient, his readings are usually elevated in clinical setting but he is getting normal readings at home He will continue to monitor BP at home and F/U for readings >140/90 Labs are up to date RTC in 6 months for CPE, annual labs - losartan (COZAAR) 100 MG tablet; Take 1 tablet (100 mg total) by mouth every morning.  Dispense: 90 tablet; Refill: 3

## 2017-12-07 NOTE — Assessment & Plan Note (Signed)
Overdue for colonoscopy He received letter from GI in mail to schedule but he is actually interested in cologuard cologuard brochure provided to patient with instructions to verify insurance coverage and notify clinic for cologuard order

## 2017-12-28 DIAGNOSIS — R69 Illness, unspecified: Secondary | ICD-10-CM | POA: Diagnosis not present

## 2018-01-09 DIAGNOSIS — H4031X2 Glaucoma secondary to eye trauma, right eye, moderate stage: Secondary | ICD-10-CM | POA: Diagnosis not present

## 2018-01-09 DIAGNOSIS — H2513 Age-related nuclear cataract, bilateral: Secondary | ICD-10-CM | POA: Diagnosis not present

## 2018-01-28 DIAGNOSIS — H4031X2 Glaucoma secondary to eye trauma, right eye, moderate stage: Secondary | ICD-10-CM | POA: Diagnosis not present

## 2018-02-12 DIAGNOSIS — H43811 Vitreous degeneration, right eye: Secondary | ICD-10-CM | POA: Diagnosis not present

## 2018-02-12 DIAGNOSIS — H25043 Posterior subcapsular polar age-related cataract, bilateral: Secondary | ICD-10-CM | POA: Diagnosis not present

## 2018-02-12 DIAGNOSIS — H2513 Age-related nuclear cataract, bilateral: Secondary | ICD-10-CM | POA: Diagnosis not present

## 2018-02-12 DIAGNOSIS — H52203 Unspecified astigmatism, bilateral: Secondary | ICD-10-CM | POA: Diagnosis not present

## 2018-02-21 DIAGNOSIS — R350 Frequency of micturition: Secondary | ICD-10-CM | POA: Diagnosis not present

## 2018-02-21 DIAGNOSIS — N3946 Mixed incontinence: Secondary | ICD-10-CM | POA: Diagnosis not present

## 2018-03-21 DIAGNOSIS — C61 Malignant neoplasm of prostate: Secondary | ICD-10-CM | POA: Diagnosis not present

## 2018-03-29 DIAGNOSIS — C61 Malignant neoplasm of prostate: Secondary | ICD-10-CM | POA: Diagnosis not present

## 2018-03-29 DIAGNOSIS — N393 Stress incontinence (female) (male): Secondary | ICD-10-CM | POA: Diagnosis not present

## 2018-04-11 DIAGNOSIS — H25041 Posterior subcapsular polar age-related cataract, right eye: Secondary | ICD-10-CM | POA: Diagnosis not present

## 2018-04-11 DIAGNOSIS — H25811 Combined forms of age-related cataract, right eye: Secondary | ICD-10-CM | POA: Diagnosis not present

## 2018-04-11 DIAGNOSIS — H25011 Cortical age-related cataract, right eye: Secondary | ICD-10-CM | POA: Diagnosis not present

## 2018-04-11 DIAGNOSIS — H2511 Age-related nuclear cataract, right eye: Secondary | ICD-10-CM | POA: Diagnosis not present

## 2018-04-30 DIAGNOSIS — R69 Illness, unspecified: Secondary | ICD-10-CM | POA: Diagnosis not present

## 2018-06-20 DIAGNOSIS — H25012 Cortical age-related cataract, left eye: Secondary | ICD-10-CM | POA: Diagnosis not present

## 2018-06-20 DIAGNOSIS — H25812 Combined forms of age-related cataract, left eye: Secondary | ICD-10-CM | POA: Diagnosis not present

## 2018-06-20 DIAGNOSIS — H25042 Posterior subcapsular polar age-related cataract, left eye: Secondary | ICD-10-CM | POA: Diagnosis not present

## 2018-06-20 DIAGNOSIS — H2512 Age-related nuclear cataract, left eye: Secondary | ICD-10-CM | POA: Diagnosis not present

## 2018-06-20 HISTORY — PX: CATARACT EXTRACTION: SUR2

## 2018-06-24 ENCOUNTER — Ambulatory Visit (INDEPENDENT_AMBULATORY_CARE_PROVIDER_SITE_OTHER)
Admission: RE | Admit: 2018-06-24 | Discharge: 2018-06-24 | Disposition: A | Discharge status: Home / Self Care | Payer: Medicare HMO | Source: Ambulatory Visit | Attending: Nurse Practitioner | Admitting: Nurse Practitioner

## 2018-06-24 ENCOUNTER — Other Ambulatory Visit (INDEPENDENT_AMBULATORY_CARE_PROVIDER_SITE_OTHER): Payer: Medicare HMO

## 2018-06-24 ENCOUNTER — Ambulatory Visit (INDEPENDENT_AMBULATORY_CARE_PROVIDER_SITE_OTHER): Payer: Medicare HMO | Admitting: Nurse Practitioner

## 2018-06-24 ENCOUNTER — Encounter: Payer: Self-pay | Admitting: Nurse Practitioner

## 2018-06-24 VITALS — BP 150/80 | HR 78 | Ht 72.0 in | Wt 257.0 lb

## 2018-06-24 DIAGNOSIS — R002 Palpitations: Secondary | ICD-10-CM | POA: Diagnosis not present

## 2018-06-24 DIAGNOSIS — I10 Essential (primary) hypertension: Secondary | ICD-10-CM

## 2018-06-24 DIAGNOSIS — R05 Cough: Secondary | ICD-10-CM | POA: Diagnosis not present

## 2018-06-24 LAB — CBC
HCT: 50.5 % (ref 39.0–52.0)
HEMOGLOBIN: 16.9 g/dL (ref 13.0–17.0)
MCHC: 33.4 g/dL (ref 30.0–36.0)
MCV: 88.8 fl (ref 78.0–100.0)
Platelets: 208 10*3/uL (ref 150.0–400.0)
RBC: 5.69 Mil/uL (ref 4.22–5.81)
RDW: 13.4 % (ref 11.5–15.5)
WBC: 7.8 10*3/uL (ref 4.0–10.5)

## 2018-06-24 LAB — COMPREHENSIVE METABOLIC PANEL
ALK PHOS: 84 U/L (ref 39–117)
ALT: 15 U/L (ref 0–53)
AST: 18 U/L (ref 0–37)
Albumin: 4.4 g/dL (ref 3.5–5.2)
BUN: 22 mg/dL (ref 6–23)
CO2: 23 mEq/L (ref 19–32)
Calcium: 9.3 mg/dL (ref 8.4–10.5)
Chloride: 105 mEq/L (ref 96–112)
Creatinine, Ser: 0.75 mg/dL (ref 0.40–1.50)
GFR: 108.66 mL/min (ref >60.00)
Glucose, Bld: 117 mg/dL — ABNORMAL HIGH (ref 70–99)
POTASSIUM: 4.2 meq/L (ref 3.5–5.1)
Sodium: 139 mEq/L (ref 135–145)
TOTAL PROTEIN: 7.3 g/dL (ref 6.0–8.3)
Total Bilirubin: 1 mg/dL (ref 0.2–1.2)

## 2018-06-24 LAB — TSH: TSH: 2.18 u[IU]/mL (ref 0.35–4.50)

## 2018-06-24 NOTE — Progress Notes (Signed)
Abron N Mello is a 72 y.o. male with the following history as recorded in EpicCare:  Patient Active Problem List   Diagnosis Date Noted  . Healthcare maintenance 01/06/2016  . Prostate cancer ([place]) 10/16/2012  . DEGENERATIVE JOINT DISEASE, BOTH KNEES, SEVERE 02/17/2010  . Essential hypertension 01/07/2009  . Diverticulosis of large intestine 01/07/2009  . Hyperglycemia 01/07/2009  . GLAUCOMA, PRIMARY OPEN-ANGLE 02/25/2008  . ARTHRITIS 02/25/2008  . HYPERLIPIDEMIA 07/26/2007  . GERD 07/26/2007    Current Outpatient Medications  Medication Sig Dispense Refill  . aspirin 81 MG tablet Take 81 mg by mouth daily.    . dorzolamide-timolol (COSOPT) 22.3-6.8 MG/ML ophthalmic solution 1 drop 2 (two) times daily.    . ILEVRO 0.3 % ophthalmic suspension     . losartan (COZAAR) 100 MG tablet Take 1 tablet (100 mg total) by mouth every morning. 90 tablet 3  . moxifloxacin (VIGAMOX) 0.5 % ophthalmic solution     . prednisoLONE acetate (PRED FORTE) 1 % ophthalmic suspension     . PROLENSA 0.07 % SOLN     . simvastatin (ZOCOR) 20 MG tablet Take 1 tablet (20 mg total) by mouth at bedtime. 90 tablet 3   No current facility-administered medications for this visit.     Allergies: Penicillins; Lisinopril; and Other  Past Medical History:  Diagnosis Date  . Allergy   . Arthritis   . Erectile dysfunction   . Glaucoma    OD only; from remote trauma (open angle)  . Heart murmur   . Hyperlipidemia   . Hypertension   . Prostate cancer ([place]) 10/16/12   Adenocarcinoma    Past Surgical History:  Procedure Laterality Date  . CATARACT EXTRACTION Left 06/20/2018  . COLONOSCOPY  2009   Dr Deatra Ina; due 2019  . LYMPHADENECTOMY Bilateral 02/27/2013   Procedure: LYMPHADENECTOMY;  Surgeon: Dutch Gray, MD;  Location: [facility];  Service: Urology;  Laterality: Bilateral;  . ROBOT ASSISTED LAPAROSCOPIC RADICAL PROSTATECTOMY N/A 02/27/2013   Procedure: ROBOTIC ASSISTED LAPAROSCOPIC RADICAL PROSTATECTOMY LEVEL 2;   Surgeon: Dutch Gray, MD;  Location[facility];  Service: Urology;  Laterality: N/A;  . TONSILLECTOMY     age 4 Dr.Yut  . TOTAL KNEE ARTHROPLASTY  07/2010   Dr Maureen Ralphs  . undescended testicle     age 41    Family History  Problem Relation Age of Onset  . Heart attack Mother 65  . Stroke Father 40  . Osteoarthritis Brother   . Cancer Neg Hx   . Diabetes Neg Hx     Social History   Tobacco Use  . Smoking status: Never Smoker  . Smokeless tobacco: Never Used  Substance Use Topics  . Alcohol use: No     Subjective:  Mr [name] is here today for evaluation of acute complaint of an episode of palpitations and elevated blood pressure which occurred during cataract surgery of left eye last week on 06/20/18. He reports he was being hooked up for surgery when the techs told him that his heart rate and blood pressure were going up, felt palpitations in his throat, and then after a few minutes he felt better and vitals signs returned to normal, he then had a second episode several seconds later which resolved shortly after, he was hooked to cardiac monitor during the events, and possible artifact/PVCs/PACS were noted with the elevated heart rate, otherwise remained in sinus rhythm. He denies weakness, confusion, syncope, speech changes, chest pain, shortness of breath. His blood pressure remains slightly elevated today,  but has a history of higher BP in clinical setting and gets normal readings 120s/80s when checking BP readings at home, continues his losartan 100 daily as prescribed He had cataract surgery of right eye about 2 months ago and was stable throughout He has not noticed any further palpitations since the surgery last week, and does say he felt anxious about the procedure He did have workup by PCP for palpitations about 5 years ago, was normal, and has not ever seen cardiology  BP Readings from Last 3 Encounters:  06/24/18 (!) 150/80  12/07/17 (!) 144/82  04/27/17 (!) 150/92     Objective:  Vitals:   06/24/18 0955  BP: (!) 150/80  Pulse: 78  SpO2: 97%  Weight: 257 lb (116.6 kg)  Height: 6' (1.829 m)    General: Well developed, well nourished, in no acute distress  Skin : Warm and dry.  Head: Normocephalic and atraumatic  Eyes: Sclera and conjunctiva clear; pupils round and reactive to light; extraocular movements intact  Oropharynx: Pink, supple. Neck: Supple Lungs: Respirations unlabored; clear to auscultation bilaterally without wheeze, rales, rhonchi  CVS exam: normal rate and regular rhythm, S1 and S2 normal.  Extremities: No edema, cyanosis, clubbing  Vessels: Symmetric bilaterally  Neurologic: Alert and oriented; speech intact; face symmetrical; moves all extremities well; CNII-XII intact without focal deficit  Psychiatric: Normal mood and affect.  Assessment:  1. Palpitations   2. Essential hypertension     Plan:   Return in about 1 month (around 07/25/2018) for CPE.  Orders Placed This Encounter  Procedures  . DG Chest 2 View    Standing Status:   Future    Standing Expiration Date:   08/26/2019    Order Specific Question:   Reason for Exam (SYMPTOM  OR DIAGNOSIS REQUIRED)    Answer:   palpitations    Order Specific Question:   Preferred imaging location?    Answer:   [facility]    Order Specific Question:   Radiology Contrast Protocol - do NOT remove file path    Answer:   [email or web address]  . CBC    Standing Status:   Future    Standing Expiration Date:   06/25/2019  . Comprehensive metabolic panel    Standing Status:   Future    Standing Expiration Date:   06/25/2019  . TSH    Standing Status:   Future    Standing Expiration Date:   06/25/2019  . EKG 12-Lead    Order Specific Question:   Where should this test be performed?    Answer:   Other    Requested Prescriptions    No prescriptions requested or ordered in this encounter    Palpitations resolved Could have been related  to surgical procedure Labs and ekg today Home management, red flags and return precautions including when to seek immediate care discussed and printed on AVS RTC in 1 month for f/u- will consider referral to cardiology if symptoms persist  F/U with further recommendations pending lab results - EKG 12-Lead-sinus rhythm, no acute abnormalities - CBC; Future - Comprehensive metabolic panel; Future - TSH; Future - DG Chest 2 View; Future

## 2018-06-24 NOTE — Patient Instructions (Addendum)
Head downstairs for lab work today  Let me know if you are getting any blood pressure readings over 140/90 at home  I will plan to see you back in 1 month, sooner if needed   Palpitations A palpitation is the feeling that your heart:  Has an uneven (irregular) heartbeat.  Is beating faster than normal.  Is fluttering.  Is skipping a beat.  This is usually not a serious problem. In some cases, you may need more medical tests. Follow these instructions at home:  Avoid: ? Caffeine in coffee, tea, soft drinks, diet pills, and energy drinks. ? Chocolate. ? Alcohol.  Do not use any tobacco products. These include cigarettes, chewing tobacco, and e-cigarettes. If you need help quitting, ask your doctor.  Try to reduce your stress. These things may help: ? Yoga. ? Meditation. ? Physical activity. Swimming, jogging, and walking are good choices. ? A method that helps you use your mind to control things in your body, like heartbeats (biofeedback).  Get plenty of rest and sleep.  Take over-the-counter and prescription medicines only as told by your doctor.  Keep all follow-up visits as told by your doctor. This is important. Contact a doctor if:  Your heartbeat is still fast or uneven after 24 hours.  Your palpitations occur more often. Get help right away if:  You have chest pain.  You feel short of breath.  You have a very bad headache.  You feel dizzy.  You pass out (faint). This information is not intended to replace advice given to you by your health care provider. Make sure you discuss any questions you have with your health care provider. Document Released: 04/11/2008 Document Revised: 12/09/2015 Document Reviewed: 03/18/2015 Elsevier Interactive Patient Education  [employer].

## 2018-06-24 NOTE — Assessment & Plan Note (Addendum)
Mildly elevated clinical reading with normal readings at home Continue current medications He will continue to monitor BP at home and F/U for readings >140/90 - CBC; Future - Comprehensive metabolic panel; Future - TSH; Future

## 2018-06-28 DIAGNOSIS — H35351 Cystoid macular degeneration, right eye: Secondary | ICD-10-CM | POA: Diagnosis not present

## 2018-07-01 DIAGNOSIS — H4031X2 Glaucoma secondary to eye trauma, right eye, moderate stage: Secondary | ICD-10-CM | POA: Diagnosis not present

## 2018-07-19 DIAGNOSIS — H35353 Cystoid macular degeneration, bilateral: Secondary | ICD-10-CM | POA: Diagnosis not present

## 2018-07-26 ENCOUNTER — Ambulatory Visit (INDEPENDENT_AMBULATORY_CARE_PROVIDER_SITE_OTHER): Payer: Medicare HMO | Admitting: Nurse Practitioner

## 2018-07-26 ENCOUNTER — Encounter: Payer: Self-pay | Admitting: Nurse Practitioner

## 2018-07-26 ENCOUNTER — Other Ambulatory Visit (INDEPENDENT_AMBULATORY_CARE_PROVIDER_SITE_OTHER): Payer: Medicare HMO

## 2018-07-26 VITALS — BP 130/90 | HR 82 | Ht 72.0 in | Wt 261.0 lb

## 2018-07-26 DIAGNOSIS — Z0001 Encounter for general adult medical examination with abnormal findings: Secondary | ICD-10-CM

## 2018-07-26 DIAGNOSIS — C61 Malignant neoplasm of prostate: Secondary | ICD-10-CM

## 2018-07-26 DIAGNOSIS — E782 Mixed hyperlipidemia: Secondary | ICD-10-CM | POA: Diagnosis not present

## 2018-07-26 DIAGNOSIS — Z0000 Encounter for general adult medical examination without abnormal findings: Secondary | ICD-10-CM

## 2018-07-26 DIAGNOSIS — Z1212 Encounter for screening for malignant neoplasm of rectum: Secondary | ICD-10-CM | POA: Diagnosis not present

## 2018-07-26 DIAGNOSIS — R739 Hyperglycemia, unspecified: Secondary | ICD-10-CM

## 2018-07-26 DIAGNOSIS — Z1211 Encounter for screening for malignant neoplasm of colon: Secondary | ICD-10-CM | POA: Diagnosis not present

## 2018-07-26 DIAGNOSIS — I10 Essential (primary) hypertension: Secondary | ICD-10-CM | POA: Diagnosis not present

## 2018-07-26 DIAGNOSIS — R059 Cough, unspecified: Secondary | ICD-10-CM

## 2018-07-26 DIAGNOSIS — R05 Cough: Secondary | ICD-10-CM

## 2018-07-26 LAB — LIPID PANEL
Cholesterol: 166 mg/dL (ref 0–200)
HDL: 38.7 mg/dL — ABNORMAL LOW (ref >39.00)
LDL Cholesterol: 102 mg/dL — ABNORMAL HIGH (ref 0–99)
NonHDL: 127.21
Total CHOL/HDL Ratio: 4
Triglycerides: 124 mg/dL (ref 0.0–149.0)
VLDL: 24.8 mg/dL (ref 0.0–40.0)

## 2018-07-26 LAB — HEMOGLOBIN A1C: Hgb A1c MFr Bld: 5.9 % (ref 4.6–6.5)

## 2018-07-26 MED ORDER — OMEPRAZOLE 40 MG PO CPDR: 40.0000 mg | DELAYED_RELEASE_CAPSULE | Freq: Every day | ORAL | 1 refills | Status: DC

## 2018-07-26 NOTE — Assessment & Plan Note (Addendum)
  BP remains slightly elevated Continue current medication Discussed the role of healthy diet and exercise in the management of HTN He is going to start monitoring BP at home and RTC in 1 month with home BP log, will consider adding second BP agent if his BP remains elevated - Lipid panel; Future

## 2018-07-26 NOTE — Assessment & Plan Note (Signed)
Continue regular follow up with urology

## 2018-07-26 NOTE — Assessment & Plan Note (Signed)
06/24/18 CBC, CMET, TSH WNL aside from elevated gluose 10/05/17 A1c WNL PSA q 82months  By urology  Reviewed annual screening exams, healthy lifestyle, weight loss, additional information provided on AVS Recommended AWV with Sharee Pimple - Hemoglobin A1c; Future - Lipid panel; Future  Colon cancer screening- Cologuard  Screening for malignant neoplasm of the rectum- Cologuard

## 2018-07-26 NOTE — Assessment & Plan Note (Signed)
Update A1c F/U with further recommendations pending lab results - Hemoglobin A1c; Future  

## 2018-07-26 NOTE — Assessment & Plan Note (Signed)
Continue current medication Update labs- he is fasting - Lipid panel; Future

## 2018-07-26 NOTE — Progress Notes (Signed)
[name] is a 73 y.o. male with the following history as recorded in EpicCare:  Patient Active Problem List   Diagnosis Date Noted  . Healthcare maintenance 01/06/2016  . Prostate cancer ([place]) 10/16/2012  . DEGENERATIVE JOINT DISEASE, BOTH KNEES, SEVERE 02/17/2010  . Essential hypertension 01/07/2009  . Diverticulosis of large intestine 01/07/2009  . Hyperglycemia 01/07/2009  . GLAUCOMA, PRIMARY OPEN-ANGLE 02/25/2008  . ARTHRITIS 02/25/2008  . HYPERLIPIDEMIA 07/26/2007  . GERD 07/26/2007    Current Outpatient Medications  Medication Sig Dispense Refill  . aspirin 81 MG tablet Take 81 mg by mouth daily.    . dorzolamide-timolol (COSOPT) 22.3-6.8 MG/ML ophthalmic solution 1 drop 2 (two) times daily.    Marland Kitchen losartan (COZAAR) 100 MG tablet Take 1 tablet (100 mg total) by mouth every morning. 90 tablet 3  . prednisoLONE acetate (PRED FORTE) 1 % ophthalmic suspension     . PROLENSA 0.07 % SOLN     . simvastatin (ZOCOR) 20 MG tablet Take 1 tablet (20 mg total) by mouth at bedtime. 90 tablet 3  . omeprazole (PRILOSEC) 40 MG capsule Take 1 capsule (40 mg total) by mouth daily. 30 capsule 1   No current facility-administered medications for this visit.     Allergies: Penicillins; Lisinopril; and Other  Past Medical History:  Diagnosis Date  . Allergy   . Arthritis   . Erectile dysfunction   . Glaucoma    OD only; from remote trauma (open angle)  . Heart murmur   . Hyperlipidemia   . Hypertension   . Prostate cancer ([place]) 10/16/12   Adenocarcinoma    Past Surgical History:  Procedure Laterality Date  . CATARACT EXTRACTION Left 06/20/2018  . COLONOSCOPY  2009   Dr Deatra Ina; due 2019  . LYMPHADENECTOMY Bilateral 02/27/2013   Procedure: LYMPHADENECTOMY;  Surgeon: Dutch Gray, MD;  Location: [facility];  Service: Urology;  Laterality: Bilateral;  . ROBOT ASSISTED LAPAROSCOPIC RADICAL PROSTATECTOMY N/A 02/27/2013   Procedure: ROBOTIC ASSISTED LAPAROSCOPIC RADICAL PROSTATECTOMY LEVEL 2;   Surgeon: Dutch Gray, MD;  Location: [facility];  Service: Urology;  Laterality: N/A;  . TONSILLECTOMY     age 20 Dr.Yut  . TOTAL KNEE ARTHROPLASTY  07/2010   Dr Maureen Ralphs  . undescended testicle     age 76    Family History  Problem Relation Age of Onset  . Heart attack Mother 104  . Stroke Father 64  . Osteoarthritis Brother   . Cancer Neg Hx   . Diabetes Neg Hx     Social History   Tobacco Use  . Smoking status: Never Smoker  . Smokeless tobacco: Never Used  Substance Use Topics  . Alcohol use: No     Subjective:  Mr [name] is here today for CPE.  Last dental exam: 2019 Last vision exam: 2019 PSA: routinely with urology  Lung ca screening: never smoker  Colonoscopy: cologuard ordered Lipids: lipid panel ordered DM screening- a1c ordered Vaccinations: up to date Diet and exercise: wants to lose weight, going to join silver sneakers   HTN- maintained on Losartan 100, takes daily as prescribed without noted adverse effects. Feels BP is higher in the clinical setting but has not been checking home readings for past month BP Readings from Last 3 Encounters:  07/26/18 130/90  06/24/18 (!) 150/80  12/07/17 (!) 144/82   HLD- maintained on simvastatin 20 Reports daily medication compliance without adverse medication effects.  Lab Results  Component Value Date   CHOL 155 04/27/2017  HDL 36.70 (L) 04/27/2017   LDLCALC 95 04/27/2017   TRIG 116.0 04/27/2017   CHOLHDL 4 04/27/2017     Review of Systems  Constitutional: Negative for chills, fever and weight loss.  HENT: Negative for hearing loss.   Respiratory: Positive for cough. Negative for shortness of breath.   Cardiovascular: Negative for chest pain and palpitations.  Gastrointestinal: Positive for heartburn. Negative for abdominal pain, constipation, diarrhea, nausea and vomiting.  Genitourinary: Negative for dysuria and hematuria.  Musculoskeletal: Negative for falls and myalgias.  Skin: Negative for rash.   Neurological: Negative for dizziness, speech change, loss of consciousness and weakness.  Endo/Heme/Allergies: Does not bruise/bleed easily.  Psychiatric/Behavioral: Negative for depression. The patient is not nervous/anxious.    Cough- This is not a new problem He has noticed a dry cough in morning and evening only for "a while now", maybe years, no worse over time just "nagging cough" His wife wanted him to mention this today He does notice occasional heartburn, wonders if this is related  Objective:  Vitals:   07/26/18 0829 07/26/18 0857  BP: 130/90 130/90  Pulse: 82   SpO2: 97%   Weight: 261 lb (118.4 kg)   Height: 6' (1.829 m)     General: Well developed, well nourished, in no acute distress  Skin : Warm and dry.  Head: Normocephalic and atraumatic  Eyes: Sclera and conjunctiva clear; pupils round and reactive to light; extraocular movements intact  Ears: External normal; canals clear; tympanic membranes normal  Oropharynx: Pink, supple. No suspicious lesions  Neck: Supple without thyromegaly, adenopathy  Lungs: Respirations unlabored; clear to auscultation bilaterally without wheeze, rales, rhonchi  CVS exam: normal rate and regular rhythm, S1 and S2 normal.  Abdomen: Soft; nontender; no masses or hepatosplenomegaly  Musculoskeletal: No deformities; no active joint inflammation  Extremities: No edema, cyanosis, clubbing  Vessels: Symmetric bilaterally  Neurologic: Alert and oriented; speech intact; face symmetrical; moves all extremities well; CNII-XII intact without focal deficit  Psychiatric: Normal mood and affect.  Assessment:  1. Encounter for general adult medical examination with abnormal findings   2. Prostate cancer ([place])   3. Essential hypertension   4. HYPERLIPIDEMIA   5. Hyperglycemia   6. Cough      Plan:   Cough 06/24/18 CXR WNL Will start treatment for GERD - medication dosing, side effects discussed Home management, red flags and return  precautions including when to seek immediate care discussed and printed on AVS RTC in1 month for F/U - check response to prilosec - omeprazole (PRILOSEC) 40 MG capsule; Take 1 capsule (40 mg total) by mouth daily.  Dispense: 30 capsule; Refill: 1  Return in about 1 month (around 08/26/2018) for f/u- BP-recheck BP, log; Cough-starting PPI.  Orders Placed This Encounter  Procedures  . Hemoglobin A1c    Standing Status:   Future    Standing Expiration Date:   07/27/2019  . Lipid panel    Standing Status:   Future    Standing Expiration Date:   07/27/2019    Requested Prescriptions   Signed Prescriptions Disp Refills  . omeprazole (PRILOSEC) 40 MG capsule 30 capsule 1    Sig: Take 1 capsule (40 mg total) by mouth daily.

## 2018-07-26 NOTE — Patient Instructions (Addendum)
Please head downstairs for lab work  If you have medicare related insurance (such as traditional Medicare, [employer], [employer], or similar), Please make an appointment at the scheduling desk with Sharee Pimple, the [employer], for your Wellness visit in this office, which is a benefit with your insurance.  Start prilosec once daily. Please try to check your blood pressure once daily or at least a few times a week, at the same time each day, and keep a log. Please return in about 1 month for follow up so I can recheck your BP and see how your cough is on the prilosec.  Cough, Adult  A cough helps to clear your throat and lungs. A cough may last only 2-3 weeks (acute), or it may last longer than 8 weeks (chronic). Many different things can cause a cough. A cough may be a sign of an illness or another medical condition. Follow these instructions at home:  Pay attention to any changes in your cough.  Take medicines only as told by your doctor. ? If you were prescribed an antibiotic medicine, take it as told by your doctor. Do not stop taking it even if you start to feel better. ? Talk with your doctor before you try using a cough medicine.  Drink enough fluid to keep your pee (urine) clear or pale yellow.  If the air is dry, use a cold steam vaporizer or humidifier in your home.  Stay away from things that make you cough at work or at home.  If your cough is worse at night, try using extra pillows to raise your head up higher while you sleep.  Do not smoke, and try not to be around smoke. If you need help quitting, ask your doctor.  Do not have caffeine.  Do not drink alcohol.  Rest as needed. Contact a doctor if:  You have new problems (symptoms).  You cough up yellow fluid (pus).  Your cough does not get better after 2-3 weeks, or your cough gets worse.  Medicine does not help your cough and you are not sleeping well.  You have pain that gets  worse or pain that is not helped with medicine.  You have a fever.  You are losing weight and you do not know why.  You have night sweats. Get help right away if:  You cough up blood.  You have trouble breathing.  Your heartbeat is very fast. This information is not intended to replace advice given to you by your health care provider. Make sure you discuss any questions you have with your health care provider. Document Released: 03/16/2011 Document Revised: 12/09/2015 Document Reviewed: 09/09/2014 Elsevier Interactive Patient Education  2019 [employer] After Age 4 After age 1, you are at a higher risk for certain long-term diseases and infections as well as injuries from falls. Falls are a major cause of broken bones and head injuries in people who are older than age 72. Getting regular preventive care can help to keep you healthy and well. Preventive care includes getting regular testing and making lifestyle changes as recommended by your health care provider. Talk with your health care provider about:  Which screenings and tests you should have. A screening is a test that checks for a disease when you have no symptoms.  A diet and exercise plan that is right for you. What should I know about screenings and tests to prevent falls? Screening and testing are the  best ways to find a health problem early. Early diagnosis and treatment give you the best chance of managing medical conditions that are common after age 39. Certain conditions and lifestyle choices may make you more likely to have a fall. Your health care provider may recommend:  Regular vision checks. Poor vision and conditions such as cataracts can make you more likely to have a fall. If you wear glasses, make sure to get your prescription updated if your vision changes.  Medicine review. Work with your health care provider to regularly review all of the medicines you are taking, including  over-the-counter medicines. Ask your health care provider about any side effects that may make you more likely to have a fall. Tell your health care provider if any medicines that you take make you feel dizzy or sleepy.  Osteoporosis screening. Osteoporosis is a condition that causes the bones to get weaker. This can make the bones weak and cause them to break more easily.  Blood pressure screening. Blood pressure changes and medicines to control blood pressure can make you feel dizzy.  Strength and balance checks. Your health care provider may recommend certain tests to check your strength and balance while standing, walking, or changing positions.  Foot health exam. Foot pain and numbness, as well as not wearing proper footwear, can make you more likely to have a fall.  Depression screening. You may be more likely to have a fall if you have a fear of falling, feel emotionally low, or feel unable to do activities that you used to do.  Alcohol use screening. Using too much alcohol can affect your balance and may make you more likely to have a fall. What actions can I take to lower my risk of falls? General instructions  Talk with your health care provider about your risks for falling. Tell your health care provider if: ? You fall. Be sure to tell your health care provider about all falls, even ones that seem minor. ? You feel dizzy, sleepy, or off-balance.  Take over-the-counter and prescription medicines only as told by your health care provider. These include any supplements.  Eat a healthy diet and maintain a healthy weight. A healthy diet includes low-fat dairy products, low-fat (lean) meats, and fiber from whole grains, beans, and lots of fruits and vegetables. Home safety  Remove any tripping hazards, such as rugs, cords, and clutter.  Install safety equipment such as grab bars in bathrooms and safety rails on stairs.  Keep rooms and walkways well-lit. Activity   Follow a  regular exercise program to stay fit. This will help you maintain your balance. Ask your health care provider what types of exercise are appropriate for you.  If you need a cane or walker, use it as recommended by your health care provider.  Wear supportive shoes that have nonskid soles. Lifestyle  Do not drink alcohol if your health care provider tells you not to drink.  If you drink alcohol, limit how much you have: ? 0-1 drink a day for women. ? 0-2 drinks a day for men.  Be aware of how much alcohol is in your drink. In the [place] one drink equals one typical bottle of beer (12 oz), one-half glass of wine (5 oz), or one shot of hard liquor (1 oz).  Do not use any products that contain nicotine or tobacco, such as cigarettes and e-cigarettes. If you need help quitting, ask your health care provider. Summary  Having a healthy lifestyle and getting  preventive care can help to protect your health and wellness after age 32.  Screening and testing are the best way to find a health problem early and help you avoid having a fall. Early diagnosis and treatment give you the best chance for managing medical conditions that are more common for people who are older than age 36.  Falls are a major cause of broken bones and head injuries in people who are older than age 65. Take precautions to prevent a fall at home.  Work with your health care provider to learn what changes you can make to improve your health and wellness and to prevent falls. This information is not intended to replace advice given to you by your health care provider. Make sure you discuss any questions you have with your health care provider. Document Released: 05/16/2017 Document Revised: 05/16/2017 Document Reviewed: 05/16/2017 Elsevier Interactive Patient Education  2019 [employer].

## 2018-07-29 ENCOUNTER — Other Ambulatory Visit: Payer: Self-pay | Admitting: Nurse Practitioner

## 2018-07-29 DIAGNOSIS — I10 Essential (primary) hypertension: Secondary | ICD-10-CM

## 2018-07-29 DIAGNOSIS — R7303 Prediabetes: Secondary | ICD-10-CM

## 2018-07-29 DIAGNOSIS — E782 Mixed hyperlipidemia: Secondary | ICD-10-CM

## 2018-08-07 DIAGNOSIS — Z1211 Encounter for screening for malignant neoplasm of colon: Secondary | ICD-10-CM | POA: Diagnosis not present

## 2018-08-07 DIAGNOSIS — Z1212 Encounter for screening for malignant neoplasm of rectum: Secondary | ICD-10-CM | POA: Diagnosis not present

## 2018-08-07 LAB — COLOGUARD: Cologuard: NEGATIVE

## 2018-08-16 ENCOUNTER — Encounter: Payer: Self-pay | Admitting: Nurse Practitioner

## 2018-08-20 DIAGNOSIS — H35352 Cystoid macular degeneration, left eye: Secondary | ICD-10-CM | POA: Diagnosis not present

## 2018-08-20 DIAGNOSIS — Z961 Presence of intraocular lens: Secondary | ICD-10-CM | POA: Diagnosis not present

## 2018-08-26 ENCOUNTER — Encounter: Payer: Self-pay | Admitting: Nurse Practitioner

## 2018-08-26 ENCOUNTER — Ambulatory Visit (INDEPENDENT_AMBULATORY_CARE_PROVIDER_SITE_OTHER): Payer: Medicare HMO | Admitting: Nurse Practitioner

## 2018-08-26 VITALS — BP 150/94 | HR 72 | Temp 97.6°F | Resp 16 | Ht 72.0 in | Wt 256.0 lb

## 2018-08-26 DIAGNOSIS — R05 Cough: Secondary | ICD-10-CM | POA: Diagnosis not present

## 2018-08-26 DIAGNOSIS — I10 Essential (primary) hypertension: Secondary | ICD-10-CM | POA: Diagnosis not present

## 2018-08-26 DIAGNOSIS — K219 Gastro-esophageal reflux disease without esophagitis: Secondary | ICD-10-CM | POA: Diagnosis not present

## 2018-08-26 DIAGNOSIS — R059 Cough, unspecified: Secondary | ICD-10-CM

## 2018-08-26 MED ORDER — OMEPRAZOLE 20 MG PO CPDR: 40.0000 mg | DELAYED_RELEASE_CAPSULE | Freq: Every day | ORAL | 1 refills | Status: DC

## 2018-08-26 NOTE — Assessment & Plan Note (Addendum)
Stable/improved Will try decreasing prilosec dosage to 20 daily Home management, red flags and return precautions including when to seek immediate care discussed and printed on AVS RTC in about 6 weeks for F/U, sooner for new, worsening symptoms

## 2018-08-26 NOTE — Patient Instructions (Addendum)
Reduce prilosec to 20 mg daily  Return in about 6 weeks for follow up. F/U sooner for return of cough, heartburn symptoms  [facility] education [phone]  Gastroesophageal Reflux Disease, Adult Gastroesophageal reflux (GER) happens when acid from the stomach flows up into the tube that connects the mouth and the stomach (esophagus). Normally, food travels down the esophagus and stays in the stomach to be digested. With GER, food and stomach acid sometimes move back up into the esophagus. You may have a disease called gastroesophageal reflux disease (GERD) if the reflux:  Happens often.  Causes frequent or very bad symptoms.  Causes problems such as damage to the esophagus. When this happens, the esophagus becomes sore and swollen (inflamed). Over time, GERD can make small holes (ulcers) in the lining of the esophagus. What are the causes? This condition is caused by a problem with the muscle between the esophagus and the stomach. When this muscle is weak or not normal, it does not close properly to keep food and acid from coming back up from the stomach. The muscle can be weak because of:  Tobacco use.  Pregnancy.  Having a certain type of hernia (hiatal hernia).  Alcohol use.  Certain foods and drinks, such as coffee, chocolate, onions, and peppermint. What increases the risk? You are more likely to develop this condition if you:  Are overweight.  Have a disease that affects your connective tissue.  Use NSAID medicines. What are the signs or symptoms? Symptoms of this condition include:  Heartburn.  Difficult or painful swallowing.  The feeling of having a lump in the throat.  A bitter taste in the mouth.  Bad breath.  Having a lot of saliva.  Having an upset or bloated stomach.  Belching.  Chest pain. Different conditions can cause chest pain. Make sure you see your doctor if you have chest pain.  Shortness of breath or noisy breathing  (wheezing).  Ongoing (chronic) cough or a cough at night.  Wearing away of the surface of teeth (tooth enamel).  Weight loss. How is this treated? Treatment will depend on how bad your symptoms are. Your doctor may suggest:  Changes to your diet.  Medicine.  Surgery. Follow these instructions at home: Eating and drinking   Follow a diet as told by your doctor. You may need to avoid foods and drinks such as: ? Coffee and tea (with or without caffeine). ? Drinks that contain alcohol. ? Energy drinks and sports drinks. ? Bubbly (carbonated) drinks or sodas. ? Chocolate and cocoa. ? Peppermint and mint flavorings. ? Garlic and onions. ? Horseradish. ? Spicy and acidic foods. These include peppers, chili powder, curry powder, vinegar, hot sauces, and BBQ sauce. ? Citrus fruit juices and citrus fruits, such as oranges, lemons, and limes. ? Tomato-based foods. These include red sauce, chili, salsa, and pizza with red sauce. ? Fried and fatty foods. These include donuts, french fries, potato chips, and high-fat dressings. ? High-fat meats. These include hot dogs, rib eye steak, sausage, ham, and bacon. ? High-fat dairy items, such as whole milk, butter, and cream cheese.  Eat small meals often. Avoid eating large meals.  Avoid drinking large amounts of liquid with your meals.  Avoid eating meals during the 2-3 hours before bedtime.  Avoid lying down right after you eat.  Do not exercise right after you eat. Lifestyle   Do not use any products that contain nicotine or tobacco. These include cigarettes, e-cigarettes, and chewing tobacco.  If you need help quitting, ask your doctor.  Try to lower your stress. If you need help doing this, ask your doctor.  If you are overweight, lose an amount of weight that is healthy for you. Ask your doctor about a safe weight loss goal. General instructions  Pay attention to any changes in your symptoms.  Take over-the-counter and  prescription medicines only as told by your doctor. Do not take aspirin, ibuprofen, or other NSAIDs unless your doctor says it is okay.  Wear loose clothes. Do not wear anything tight around your waist.  Raise (elevate) the head of your bed about 6 inches (15 cm).  Avoid bending over if this makes your symptoms worse.  Keep all follow-up visits as told by your doctor. This is important. Contact a doctor if:  You have new symptoms.  You lose weight and you do not know why.  You have trouble swallowing or it hurts to swallow.  You have wheezing or a cough that keeps happening.  Your symptoms do not get better with treatment.  You have a hoarse voice. Get help right away if:  You have pain in your arms, neck, jaw, teeth, or back.  You feel sweaty, dizzy, or light-headed.  You have chest pain or shortness of breath.  You throw up (vomit) and your throw-up looks like blood or coffee grounds.  You pass out (faint).  Your poop (stool) is bloody or black.  You cannot swallow, drink, or eat. Summary  If a person has gastroesophageal reflux disease (GERD), food and stomach acid move back up into the esophagus and cause symptoms or problems such as damage to the esophagus.  Treatment will depend on how bad your symptoms are.  Follow a diet as told by your doctor.  Take all medicines only as told by your doctor. This information is not intended to replace advice given to you by your health care provider. Make sure you discuss any questions you have with your health care provider. Document Released: 12/20/2007 Document Revised: 01/09/2018 Document Reviewed: 01/09/2018 Elsevier Interactive Patient Education  2019 [employer].

## 2018-08-26 NOTE — Progress Notes (Signed)
[name] is a 73 y.o. male with the following history as recorded in EpicCare:  Patient Active Problem List   Diagnosis Date Noted  . Encounter for general adult medical examination with abnormal findings 01/06/2016  . Prostate cancer ([place]) 10/16/2012  . DEGENERATIVE JOINT DISEASE, BOTH KNEES, SEVERE 02/17/2010  . Essential hypertension 01/07/2009  . Diverticulosis of large intestine 01/07/2009  . Hyperglycemia 01/07/2009  . GLAUCOMA, PRIMARY OPEN-ANGLE 02/25/2008  . ARTHRITIS 02/25/2008  . HYPERLIPIDEMIA 07/26/2007  . Esophagitis 07/26/2007    Current Outpatient Medications  Medication Sig Dispense Refill  . aspirin 81 MG tablet Take 81 mg by mouth daily.    . dorzolamide-timolol (COSOPT) 22.3-6.8 MG/ML ophthalmic solution 1 drop 2 (two) times daily.    Marland Kitchen losartan (COZAAR) 100 MG tablet Take 1 tablet (100 mg total) by mouth every morning. 90 tablet 3  . omeprazole (PRILOSEC) 20 MG capsule Take 2 capsules (40 mg total) by mouth daily. 30 capsule 1  . prednisoLONE acetate (PRED FORTE) 1 % ophthalmic suspension     . PROLENSA 0.07 % SOLN     . simvastatin (ZOCOR) 20 MG tablet Take 1 tablet (20 mg total) by mouth at bedtime. 90 tablet 3   No current facility-administered medications for this visit.     Allergies: Penicillins; Lisinopril; and Other  Past Medical History:  Diagnosis Date  . Allergy   . Arthritis   . Erectile dysfunction   . Glaucoma    OD only; from remote trauma (open angle)  . Heart murmur   . Hyperlipidemia   . Hypertension   . Prostate cancer ([place]) 10/16/12   Adenocarcinoma    Past Surgical History:  Procedure Laterality Date  . CATARACT EXTRACTION Left 06/20/2018  . COLONOSCOPY  2009   Dr Deatra Ina; due 2019  . LYMPHADENECTOMY Bilateral 02/27/2013   Procedure: LYMPHADENECTOMY;  Surgeon: Dutch Gray, MD;  Location: [facility];  Service: Urology;  Laterality: Bilateral;  . ROBOT ASSISTED LAPAROSCOPIC RADICAL PROSTATECTOMY N/A 02/27/2013   Procedure:  ROBOTIC ASSISTED LAPAROSCOPIC RADICAL PROSTATECTOMY LEVEL 2;  Surgeon: Dutch Gray, MD;  Location: [facility];  Service: Urology;  Laterality: N/A;  . TONSILLECTOMY     age 24 Dr.Yut  . TOTAL KNEE ARTHROPLASTY  07/2010   Dr Maureen Ralphs  . undescended testicle     age 25    Family History  Problem Relation Age of Onset  . Heart attack Mother 55  . Stroke Father 14  . Osteoarthritis Brother   . Cancer Neg Hx   . Diabetes Neg Hx     Social History   Tobacco Use  . Smoking status: Never Smoker  . Smokeless tobacco: Never Used  Substance Use Topics  . Alcohol use: No     Subjective:  [name] is here today for follow up of HTN, cough. At his last OV on 07/26/18 we discussed his elevated BP, cough, heartburn. He is maintained on losartan 100 daily for hypertension. Reports daily medication compliance without adverse medication effects. Reports history of high BP readings in the clinical setting, at his 07/26/18 OV we discussed keeping home BP log which he returns today with, He has been checking BP readings at home over past month and readings are 110s-130s/60s-80s per his log Denies weakness, headaches, vision changes, chest pain, shortness of breath, edema. Has has also started walking every other day for about 1/2 hour. He was also started on rx for prilosec 40 daily at last ov for cough, heartburn. He tells me  that he has been taking daily as prescribed and hes not noticed any more heartburn, cough has resolved, his wife has even noticed his cough is gone. He is wondering if he needs to stay on the prilosec  BP Readings from Last 3 Encounters:  08/26/18 (!) 150/94  07/26/18 130/90  06/24/18 (!) 150/80    ROS- See HPI  Objective:  Vitals:   08/26/18 0830  BP: (!) 150/94  Pulse: 72  Resp: 16  Temp: 97.6 F (36.4 C)  TempSrc: Oral  SpO2: 98%  Weight: 256 lb (116.1 kg)  Height: 6' (1.829 m)    General: Well developed, well nourished, in no acute distress  Skin : Warm and  dry.  Head: Normocephalic and atraumatic  Eyes: Sclera and conjunctiva clear; pupils round and reactive to light; extraocular movements intact  Ears: External normal; canals clear; tympanic membranes normal  Oropharynx: Pink, supple. No suspicious lesions  Neck: Supple without thyromegaly, adenopathy  Lungs: Respirations unlabored; clear to auscultation bilaterally without wheeze, rales, rhonchi  CVS exam: normal rate and regular rhythm, S1 and S2 normal.  Extremities: No edema, cyanosis, clubbing  Vessels: Symmetric bilaterally  Neurologic: Alert and oriented; speech intact; face symmetrical; moves all extremities well; CNII-XII intact without focal deficit  Psychiatric: Normal mood and affect.   Assessment:  1. Essential hypertension   2. Cough   3. Gastroesophageal reflux disease, esophagitis presence not specified     Plan:   Cough Resolved Suspect GERD as cause Will trial reducing prilosec dosage to 20 daily, if cough returns will need to resume 40 daily RTC in about 6 weeks for F/U, sooner for new, worsening symptoms - omeprazole (PRILOSEC) 20 MG capsule; Take 2 capsules (40 mg total) by mouth daily.  Dispense: 30 capsule; Refill: 1  Return in about 6 weeks (around 10/07/2018) for F/U: gerd, cough- reducing PPI.  No orders of the defined types were placed in this encounter.   Requested Prescriptions   Signed Prescriptions Disp Refills  . omeprazole (PRILOSEC) 20 MG capsule 30 capsule 1    Sig: Take 2 capsules (40 mg total) by mouth daily.

## 2018-08-26 NOTE — Assessment & Plan Note (Signed)
BP remains slightly elevated in clinic with normal home readings Continue current medication Continue to monitor readings, f/u for readings >140/90

## 2018-08-27 ENCOUNTER — Other Ambulatory Visit: Payer: Self-pay | Admitting: *Deleted

## 2018-08-27 DIAGNOSIS — R05 Cough: Secondary | ICD-10-CM

## 2018-08-27 DIAGNOSIS — R059 Cough, unspecified: Secondary | ICD-10-CM

## 2018-08-27 MED ORDER — OMEPRAZOLE 20 MG PO CPDR: 20.0000 mg | DELAYED_RELEASE_CAPSULE | Freq: Every day | ORAL | 1 refills | Status: DC

## 2018-09-20 DIAGNOSIS — H35352 Cystoid macular degeneration, left eye: Secondary | ICD-10-CM | POA: Diagnosis not present

## 2018-10-10 ENCOUNTER — Ambulatory Visit: Payer: Medicare HMO | Admitting: Nurse Practitioner

## 2018-11-01 ENCOUNTER — Encounter: Payer: Medicare HMO | Admitting: Family

## 2018-11-12 DIAGNOSIS — H35352 Cystoid macular degeneration, left eye: Secondary | ICD-10-CM | POA: Diagnosis not present

## 2018-12-13 ENCOUNTER — Other Ambulatory Visit: Payer: Self-pay | Admitting: *Deleted

## 2018-12-13 DIAGNOSIS — I10 Essential (primary) hypertension: Secondary | ICD-10-CM

## 2018-12-13 MED ORDER — LOSARTAN POTASSIUM 100 MG PO TABS: 100.0000 mg | ORAL_TABLET | Freq: Every morning | ORAL | 1 refills | Status: DC

## 2018-12-27 DIAGNOSIS — H4031X2 Glaucoma secondary to eye trauma, right eye, moderate stage: Secondary | ICD-10-CM | POA: Diagnosis not present

## 2019-01-03 ENCOUNTER — Encounter: Payer: Self-pay | Admitting: Family

## 2019-01-03 ENCOUNTER — Ambulatory Visit (INDEPENDENT_AMBULATORY_CARE_PROVIDER_SITE_OTHER): Payer: Medicare HMO | Admitting: Family

## 2019-01-03 ENCOUNTER — Other Ambulatory Visit: Payer: Self-pay

## 2019-01-03 VITALS — BP 123/77 | HR 62 | Wt 247.0 lb

## 2019-01-03 DIAGNOSIS — E782 Mixed hyperlipidemia: Secondary | ICD-10-CM

## 2019-01-03 DIAGNOSIS — I10 Essential (primary) hypertension: Secondary | ICD-10-CM

## 2019-01-03 NOTE — Progress Notes (Signed)
[name] is a 73 y.o. male with the following history as recorded in EpicCare:  Patient Active Problem List   Diagnosis Date Noted  . Encounter for general adult medical examination with abnormal findings 01/06/2016  . Prostate cancer ([place]) 10/16/2012  . DEGENERATIVE JOINT DISEASE, BOTH KNEES, SEVERE 02/17/2010  . Essential hypertension 01/07/2009  . Diverticulosis of large intestine 01/07/2009  . Hyperglycemia 01/07/2009  . GLAUCOMA, PRIMARY OPEN-ANGLE 02/25/2008  . ARTHRITIS 02/25/2008  . HYPERLIPIDEMIA 07/26/2007  . Esophagitis 07/26/2007    Current Outpatient Medications  Medication Sig Dispense Refill  . aspirin 81 MG tablet Take 81 mg by mouth daily.    . dorzolamide-timolol (COSOPT) 22.3-6.8 MG/ML ophthalmic solution 1 drop 2 (two) times daily.    Marland Kitchen losartan (COZAAR) 100 MG tablet Take 1 tablet (100 mg total) by mouth every morning. 90 tablet 1  . omeprazole (PRILOSEC) 20 MG capsule Take 1 capsule (20 mg total) by mouth daily. 30 capsule 1  . prednisoLONE acetate (PRED FORTE) 1 % ophthalmic suspension     . PROLENSA 0.07 % SOLN     . simvastatin (ZOCOR) 20 MG tablet Take 1 tablet (20 mg total) by mouth at bedtime. 90 tablet 3   No current facility-administered medications for this visit.     Allergies: Penicillins, Lisinopril, and Other  Past Medical History:  Diagnosis Date  . Allergy   . Arthritis   . Erectile dysfunction   . Glaucoma    OD only; from remote trauma (open angle)  . Heart murmur   . Hyperlipidemia   . Hypertension   . Prostate cancer ([place]) 10/16/12   Adenocarcinoma    Past Surgical History:  Procedure Laterality Date  . CATARACT EXTRACTION Left 06/20/2018  . COLONOSCOPY  2009   Dr Deatra Ina; due 2019  . LYMPHADENECTOMY Bilateral 02/27/2013   Procedure: LYMPHADENECTOMY;  Surgeon: Dutch Gray, MD;  Location: [facility];  Service: Urology;  Laterality: Bilateral;  . ROBOT ASSISTED LAPAROSCOPIC RADICAL PROSTATECTOMY N/A 02/27/2013   Procedure:  ROBOTIC ASSISTED LAPAROSCOPIC RADICAL PROSTATECTOMY LEVEL 2;  Surgeon: Dutch Gray, MD;  Location: [facility];  Service: Urology;  Laterality: N/A;  . TONSILLECTOMY     age 62 Dr.Yut  . TOTAL KNEE ARTHROPLASTY  07/2010   Dr Maureen Ralphs  . undescended testicle     age 62    Family History  Problem Relation Age of Onset  . Heart attack Mother 29  . Stroke Father 81  . Osteoarthritis Brother   . Cancer Neg Hx   . Diabetes Neg Hx     Social History   Tobacco Use  . Smoking status: Never Smoker  . Smokeless tobacco: Never Used  Substance Use Topics  . Alcohol use: No    Subjective:    I connected with [name] on 01/03/19 at  1:40 PM EDT by a video enabled telemedicine application and verified that I am speaking with the correct person using two identifiers. Patient and I are the only 2 people on the video call;    I discussed the limitations of evaluation and management by telemedicine and the availability of in person appointments. The patient expressed understanding and agreed to proceed.  Follow-up on hypertension; doing well- has no concerns; does check his blood pressure regularly- today's reading was 123/77; has lost 9 pounds since retiring earlier this year- unfortunately not enjoying retirement has he had hoped; Denies any chest pain, shortness of breath, blurred vision or headache Sees ophthalmology regularly for glaucoma management;  Due for CPE next January;    Objective:  Vitals:   01/03/19 1355  BP: 123/77  Pulse: 62  Weight: 247 lb (112 kg)    General: Well developed, well nourished, in no acute distress  Skin : Warm and dry.  Head: Normocephalic and atraumatic  Lungs: Respirations unlabored;  Neurologic: Alert and oriented; speech intact; face symmetrical;   Assessment:  1. Essential hypertension   2. HYPERLIPIDEMIA     Plan:  Stable; continue same medications; patient will continue to monitor at home and follow-up if greater than 140/90; otherwise,  follow-up in 6 months;    No follow-ups on file.  No orders of the defined types were placed in this encounter.   Requested Prescriptions    No prescriptions requested or ordered in this encounter

## 2019-01-16 DIAGNOSIS — M531 Cervicobrachial syndrome: Secondary | ICD-10-CM | POA: Diagnosis not present

## 2019-01-16 DIAGNOSIS — R69 Illness, unspecified: Secondary | ICD-10-CM | POA: Diagnosis not present

## 2019-01-16 DIAGNOSIS — M9901 Segmental and somatic dysfunction of cervical region: Secondary | ICD-10-CM | POA: Diagnosis not present

## 2019-01-20 DIAGNOSIS — H4031X2 Glaucoma secondary to eye trauma, right eye, moderate stage: Secondary | ICD-10-CM | POA: Diagnosis not present

## 2019-02-21 ENCOUNTER — Encounter: Payer: Medicare HMO | Attending: Family | Admitting: Registered"

## 2019-02-21 DIAGNOSIS — R7303 Prediabetes: Secondary | ICD-10-CM | POA: Diagnosis not present

## 2019-02-26 ENCOUNTER — Encounter: Payer: Self-pay | Admitting: Registered"

## 2019-02-26 NOTE — Progress Notes (Signed)
On 02/21/19 patient completed Core Session 1 of Diabetes Prevention Program course virtually with [facility]. The following learning objectives were met by the patient during this class:   Learning Objectives:  Be able to explain the purpose and benefits of the National Diabetes Prevention Program.   Be able to describe the events that will take place at every session.   Know the weight loss and physical activity goals established by the [facility] Diabetes Prevention Program.   Know their own individual weight loss and physical activity goals.   Be able to explain the important effect of self-monitoring on behavior change.   Goals:   Record food and beverage intake in "Food and Activity Tracker" over the next week.   E-mail completed "Food and Activity Tracker" to Lifestyle Coach next week before session 2.  Circle the foods or beverages you think are highest in fat and calories in your food tracker.  Read the labels on the food you buy, and consider using measuring cups and spoons to help you calculate the amount you eat. We will talk about measuring in more detail in the coming weeks.   Follow-Up Plan:  Attend Core Session 2 next week.   E-mail completed "Food and Activity Tracker" to Lifestyle Coach next week before class

## 2019-02-28 ENCOUNTER — Encounter: Payer: Self-pay | Admitting: Registered"

## 2019-02-28 ENCOUNTER — Encounter (HOSPITAL_BASED_OUTPATIENT_CLINIC_OR_DEPARTMENT_OTHER): Payer: Medicare HMO | Admitting: Registered"

## 2019-02-28 DIAGNOSIS — R7303 Prediabetes: Secondary | ICD-10-CM

## 2019-02-28 NOTE — Progress Notes (Signed)
On 02/28/2019 patient completed Core Session 2 of Diabetes Prevention Program course virtually with [facility]. The following learning objectives were met by the patient during this class:   Learning Objectives:  Self-monitor their weight during the weeks following Session 2.   Describe the relationship between fat and calories.   Explain the reason for, and basic principles of, self-monitoring fat grams and calories.   Identify their personal fat gram goals.   Use the ?Fat and Calorie Counter to calculate the calories and fat grams of a given selection of foods.   Keep a running total of the fat grams they eat each day.   Calculate fat, calories, and serving sizes from nutrition labels.   Goals:   Weigh yourself at the same time each day, or every few days, and record your weight in your Food and Activity Tracker.  Write down everything you eat and drink in your Food and Activity Tracker.  Measure portions as much as you can, and start reading labels.   Use the ?Fat and Calorie Counter to figure out the amount of fat and calories in what you ate, and write the amount down in your Food and Activity Tracker.  Keep a running fat gram total throughout the day. Come as close to your fat gram goal as you can.   Follow-Up Plan:  Attend Core Session 3 next week.   Email completed  "Food and Activity Tracker" to Lifestyle Coach next week.

## 2019-03-07 ENCOUNTER — Other Ambulatory Visit: Payer: Self-pay | Admitting: *Deleted

## 2019-03-07 ENCOUNTER — Ambulatory Visit (HOSPITAL_BASED_OUTPATIENT_CLINIC_OR_DEPARTMENT_OTHER): Payer: Medicare HMO | Admitting: Registered"

## 2019-03-07 DIAGNOSIS — E782 Mixed hyperlipidemia: Secondary | ICD-10-CM

## 2019-03-07 DIAGNOSIS — R7303 Prediabetes: Secondary | ICD-10-CM | POA: Diagnosis not present

## 2019-03-07 MED ORDER — SIMVASTATIN 20 MG PO TABS: 20.0000 mg | ORAL_TABLET | Freq: Every day | ORAL | 3 refills | Status: DC

## 2019-03-13 ENCOUNTER — Encounter: Payer: Self-pay | Admitting: Registered"

## 2019-03-13 NOTE — Progress Notes (Signed)
On 03/07/2019 patient completed Core Session 3 of Diabetes Prevention Program course virtually with [facility]. The following learning objectives were met by the patient during this class:    Learning Objectives:  Weigh and measure foods.  Estimate the fat and calorie content of common foods.  Describe three ways to eat less fat and fewer calories.  Create a plan to eat less fat for the following week.   Goals:   Track weight when weighing outside of class.   Track food and beverages eaten each day in Food and Activity Tracker and include fat grams and calories for each.   Try to stay within fat gram goal.   Complete plan for eating less high fat foods and answer related homework questions.    Follow-Up Plan:  Attend Core Session 4 next week.   Bring completed "Food and Activity Tracker" next week to be reviewed by Lifestyle Coach.

## 2019-03-14 ENCOUNTER — Encounter (HOSPITAL_BASED_OUTPATIENT_CLINIC_OR_DEPARTMENT_OTHER): Payer: Medicare HMO | Admitting: Registered"

## 2019-03-14 ENCOUNTER — Encounter: Payer: Self-pay | Admitting: Registered"

## 2019-03-14 DIAGNOSIS — R7303 Prediabetes: Secondary | ICD-10-CM

## 2019-03-14 NOTE — Progress Notes (Addendum)
On 03/14/2019 patient completed Core Session 4 of Diabetes Prevention Program course virtually with [facility]. The following learning objectives were met by the patient during this class:    Learning Objectives:  Explain the health benefits of eating less fat and fewer calories.  Describe the MyPlate food guide and its recommendations, including how to reduce fat and calories in our diet.  Compare and contrast MyPlate guidelines with participants' eating habits.  List ways to replace high-fat and high-calorie foods with low-fat and low-calorie foods.  Explain the importance of eating plenty of whole grains, vegetables, and fruits, while staying within fat gram goals.  Explain the importance of eating foods from all groups of MyPlate and of eating a variety of foods from within each group.  Explain why a balanced diet is beneficial to health.  Explain why eating the same foods over and over is not the best strategy for long-term success.   Goals:   Record weight taken outside of class.   Track foods and beverages eaten each day in the "Food and Activity Tracker," including calories and fat grams for each item.   Practice comparing what you eat with the recommendations of MyPlate using the "Rate Your Plate" handout.   Complete the "Rate Your Plate" handout form on at least 3 days.   Answer homework questions.   Follow-Up Plan:  Attend Core Session 5 next week.   Bring completed "Food and Activity Tracker" next week to be reviewed by Lifestyle Coach.   

## 2019-03-20 ENCOUNTER — Other Ambulatory Visit: Payer: Self-pay

## 2019-03-20 ENCOUNTER — Ambulatory Visit (INDEPENDENT_AMBULATORY_CARE_PROVIDER_SITE_OTHER): Payer: Medicare HMO

## 2019-03-20 DIAGNOSIS — Z23 Encounter for immunization: Secondary | ICD-10-CM | POA: Diagnosis not present

## 2019-03-21 ENCOUNTER — Encounter: Payer: Self-pay | Admitting: Registered"

## 2019-03-21 ENCOUNTER — Encounter: Payer: Medicare HMO | Attending: Family | Admitting: Registered"

## 2019-03-21 DIAGNOSIS — R7303 Prediabetes: Secondary | ICD-10-CM | POA: Diagnosis not present

## 2019-03-21 NOTE — Progress Notes (Signed)
On 03/21/2019 patient completed Core Session 5 of Diabetes Prevention Program course virtually with [facility]. The following learning objectives were met by the patient during this class:   Learning Objectives:  Establish a physical activity goal.  Explain the importance of the physical activity goal.  Describe their current level of physical activity.  Name ways that they are already physically active.  Develop personal plans for physical activity for the next week.   Goals:   Record weight taken outside of class.   Track foods and beverages eaten each day in the "Food and Activity Tracker," including calories and fat grams for each item.   Make an Activity Plan including date, specific type of activity, and length of time you plan to be active that includes at last 60 minutes of activity for the week.   Track activity type, minutes you were active, and distance you reached each day in the "Food and Activity Tracker."   Follow-Up Plan: . Attend Core Session 6 next week.  . E-mail completed "Food and Activity Tracker" to Lifestyle Coach next week before class  

## 2019-03-26 DIAGNOSIS — C61 Malignant neoplasm of prostate: Secondary | ICD-10-CM | POA: Diagnosis not present

## 2019-03-28 ENCOUNTER — Encounter (HOSPITAL_BASED_OUTPATIENT_CLINIC_OR_DEPARTMENT_OTHER): Payer: Medicare HMO | Admitting: Registered"

## 2019-03-28 DIAGNOSIS — R7303 Prediabetes: Secondary | ICD-10-CM | POA: Diagnosis not present

## 2019-04-02 ENCOUNTER — Encounter: Payer: Self-pay | Admitting: Registered"

## 2019-04-02 DIAGNOSIS — C61 Malignant neoplasm of prostate: Secondary | ICD-10-CM | POA: Diagnosis not present

## 2019-04-02 DIAGNOSIS — N3946 Mixed incontinence: Secondary | ICD-10-CM | POA: Diagnosis not present

## 2019-04-02 NOTE — Progress Notes (Signed)
On 03/28/2019 patient completed Core Session 6 of Diabetes Prevention Program course virtually with [facility]. The following learning objectives were met by the patient during this class:   Learning Objectives:  Graph their daily physical activity.   Describe two ways of finding the time to be active.   Define "lifestyle activity."   Describe how to prevent injury.   Develop an activity plan for the coming week.   Goals:   Record weight taken outside of class.   Track foods and beverages eaten each day in the "Food and Activity Tracker," including calories and fat grams for each item.    Track activity type, minutes you were active, and distance you reached each day in the "Food and Activity Tracker."   Set aside one 20 to 30-minute block of time every day or find two or more periods of 10 to 15 minutes each for physical activity.   Warm up, cool down, and stretch.  Make a Physical Activities Plan for the Week.   Follow-Up Plan:  Attend Core Session 7 next week.   E-mail completed "Food and Activity Tracker" to Lifestyle Coach next week before class

## 2019-04-04 ENCOUNTER — Encounter (HOSPITAL_BASED_OUTPATIENT_CLINIC_OR_DEPARTMENT_OTHER): Payer: Medicare HMO | Admitting: Registered"

## 2019-04-04 ENCOUNTER — Encounter: Payer: Self-pay | Admitting: Registered"

## 2019-04-04 DIAGNOSIS — R7303 Prediabetes: Secondary | ICD-10-CM | POA: Diagnosis not present

## 2019-04-04 NOTE — Progress Notes (Signed)
On 04/04/2019 patient completed Core Session 7 of Diabetes Prevention Program course virtually with [facility]. The following learning objectives were met by the patient during this class:   Learning Objectives:  Define calorie balance.  Explain how healthy eating and being active are related in terms of calorie balance.   Describe the relationship between calorie balance and weight loss.   Describe his or her progress as it relates to calorie balance.   Develop an activity plan for the coming week.   Goals:   Record weight taken outside of class.   Track foods and beverages eaten each day in the "Food and Activity Tracker," including calories and fat grams for each item.    Track activity type, minutes you were active, and distance you reached each day in the "Food and Activity Tracker."   Set aside one 20 to 30-minute block of time every day or find two or more periods of 10 to15 minutes each for physical activity.   Make a Physical Activities Plan for the Week.   Make active lifestyle choices all through the day   Stay at or go slightly over activity goal.   Follow-Up Plan:  Attend Core Session 8 next week.   E-mail completed "Food and Activity Tracker" to Lifestyle Coach next week before class  

## 2019-04-11 ENCOUNTER — Encounter (HOSPITAL_BASED_OUTPATIENT_CLINIC_OR_DEPARTMENT_OTHER): Payer: Medicare HMO | Admitting: Registered"

## 2019-04-11 DIAGNOSIS — R7303 Prediabetes: Secondary | ICD-10-CM | POA: Diagnosis not present

## 2019-04-14 ENCOUNTER — Encounter: Payer: Self-pay | Admitting: Registered"

## 2019-04-14 NOTE — Progress Notes (Signed)
On 04/11/2019 patient completed Core Session 8 of Diabetes Prevention Program course virtually with [facility]. The following learning objectives were met by the patient during this class:   Learning Objectives:  Recognize positive and negative food and activity cues.   Change negative food and activity cues to positive cues.   Add positive cues for activity and eliminate cues for inactivity.   Develop a plan for removing one problem food cue for the coming week.   Goals:   Record weight taken outside of class.   Track foods and beverages eaten each day in the "Food and Activity Tracker," including calories and fat grams for each item.    Track activity type, minutes you were active, and distance you reached each day in the "Food and Activity Tracker."   Set aside one 20 to 30-minute block of time every day or find two or more periods of 10 to15 minutes each for physical activity.   Remove one problem food cue.   Add one positive cue for being more active.  Follow-Up Plan: . Attend Core Session 9 next week.  . Email completed "Food and Activity Tracker" next week to be reviewed by Lifestyle Coach.

## 2019-04-18 ENCOUNTER — Encounter: Payer: Self-pay | Admitting: Registered"

## 2019-04-18 ENCOUNTER — Encounter: Payer: Medicare HMO | Attending: Family | Admitting: Registered"

## 2019-04-18 DIAGNOSIS — R7303 Prediabetes: Secondary | ICD-10-CM | POA: Diagnosis not present

## 2019-04-18 NOTE — Progress Notes (Signed)
On 04/18/2019 patient completed Core Session 9 of Diabetes Prevention Program course virtually with [facility]. The following learning objectives were met by the patient during this class:   Learning Objectives:  List and describe five steps to problem solving.   Apply the five problem solving steps to resolve a problem he or she has with eating less fat and fewer calories or being more active.   Goals:   Record weight taken outside of class.   Track foods and beverages eaten each day in the "Food and Activity Tracker," including calories and fat grams for each item.    Track activity type, minutes you were active, and distance you reached each day in the "Food and Activity Tracker."   Set aside one 20 to 30-minute block of time every day or find two or more periods of 10 to15 minutes each for physical activity.   Use problem solving action plan created during session to problem solve.   Follow-Up Plan:  Attend Core Session 10 next week.   Email completed "Food and Activity Tracker" next week to be reviewed by Lifestyle Coach.  Email menus from favorite restaurants to next session for future discussion.

## 2019-04-25 ENCOUNTER — Encounter: Payer: Self-pay | Admitting: Registered"

## 2019-04-25 ENCOUNTER — Ambulatory Visit (HOSPITAL_BASED_OUTPATIENT_CLINIC_OR_DEPARTMENT_OTHER): Payer: Medicare HMO | Admitting: Registered"

## 2019-04-25 DIAGNOSIS — R7303 Prediabetes: Secondary | ICD-10-CM | POA: Diagnosis not present

## 2019-04-25 NOTE — Progress Notes (Signed)
On 04/25/2019 patient completed Core Session 10 of Diabetes Prevention Program course virtually with [facility]. The following learning objectives were met by the patient during this class:   Learning Objectives:  List and describe the four keys for healthy eating out.   Give examples of how to apply these keys at the type of restaurants that the participants go to regularly.   Make an appropriate meal selection from a restaurant menu.   Demonstrate how to ask for a substitute item using assertive language and a polite tone of voice.    Goals:   Record weight taken outside of class.   Track foods and beverages eaten each day in the "Food and Activity Tracker," including calories and fat grams for each item.    Track activity type, minutes you were active, and distance you reached each day in the "Food and Activity Tracker."   Set aside one 20 to 30-minute block of time every day or find two or more periods of 10 to15 minutes each for physical activity.   Utilize positive action plan and complete questions on "To Do List."   Follow-Up Plan:  Attend Core Session 11.   Email completed "Food and Activity Tracker" to be reviewed by Lifestyle Coach.

## 2019-05-09 ENCOUNTER — Encounter (HOSPITAL_BASED_OUTPATIENT_CLINIC_OR_DEPARTMENT_OTHER): Payer: Medicare HMO | Admitting: Registered"

## 2019-05-09 DIAGNOSIS — R7303 Prediabetes: Secondary | ICD-10-CM | POA: Diagnosis not present

## 2019-05-14 ENCOUNTER — Encounter: Payer: Self-pay | Admitting: Registered"

## 2019-05-14 NOTE — Progress Notes (Signed)
On 05/09/2019 patient completed Core Session 11 of Diabetes Prevention Program course virtually with [facility]. By the end of this session patients are able to complete the following objectives:   Learning Objectives:  Give examples of negative thoughts that could prevent them from meeting their goals of losing weight and being more physically active.   Describe how to stop negative thoughts and talk back to them with positive thoughts.   Practice 1) stopping negative thoughts and 2) talking back to negative thoughts with positive ones.    Goals:   Record weight taken outside of class.   Track foods and beverages eaten each day in the "Food and Activity Tracker," including calories and fat grams for each item.    Track activity type, minutes you were active, and distance you reached each day in the "Food and Activity Tracker."   If you have any negative thoughts-write them in your Food and Activity Trackers, along with how you talked back to them. Practice stopping negative thoughts and talking back to them with positive thoughts.   Follow-Up Plan:  Attend Core Session 12 next week.   Email completed "Food and Activity Tracker" next week to be reviewed by Lifestyle Coach.

## 2019-05-16 ENCOUNTER — Encounter: Payer: Medicare HMO | Attending: Family | Admitting: Registered"

## 2019-05-16 ENCOUNTER — Encounter: Payer: Self-pay | Admitting: Registered"

## 2019-05-16 DIAGNOSIS — R7303 Prediabetes: Secondary | ICD-10-CM

## 2019-05-16 NOTE — Progress Notes (Signed)
On 05/16/2019 pt completed Core Session 12 of Diabetes Prevention Program course virtually with [facility]. By the end of this session patients are able to complete the following objectives:   Learning Objectives:  Describe their current progress toward defined goals.  Describe common causes for slipping from healthy eating or being  active.  Explain what to do to get back on their feet after a slip.  Goals:   Record weight taken outside of class.   Track foods and beverages eaten each day in the "Food and Activity Tracker," including calories and fat grams for each item.    Track activity type, minutes active, and distance reached each day in the "Food and Activity Tracker."   Try out the two action plans created during session- "Slips from Healthy Eating: Action Plan" and "Slips from Being Active: Action Plan"  Answer questions on the handout.   Follow-Up Plan:  Attend Core Session 13 next week.   Email completed "Food and Activity Tracker" next week to be reviewed by Lifestyle Coach.

## 2019-05-23 ENCOUNTER — Encounter: Payer: Self-pay | Admitting: Registered"

## 2019-05-23 ENCOUNTER — Encounter: Payer: Medicare HMO | Attending: Family | Admitting: Registered"

## 2019-05-23 DIAGNOSIS — R7303 Prediabetes: Secondary | ICD-10-CM | POA: Insufficient documentation

## 2019-05-23 NOTE — Progress Notes (Signed)
On 05/23/2019 pt completed Core Session 13 of Diabetes Prevention Program course virtually with [facility]. By the end of this session patients are able to complete the following objectives:   Learning Objectives:  Describe ways to add interest and variety to their activity plans.  Define ?aerobic fitness.  Explain the four F.I.T.T. principles (frequency, intensity, time, and type of activity) and how they relate to aerobic fitness.   Goals:   Record weight taken outside of class.   Track foods and beverages eaten each day in the "Food and Activity Tracker," including calories and fat grams for each item.    Track activity type, minutes you were active, and distance you reached each day in the "Food and Activity Tracker."   Do your best to reach activity goal for the week.  Use one of the F.I.T.T. principles to jump start workouts.  Document activity level on the "To Do Next Week" handout.  Follow-Up Plan:  Attend Core Session 14 next week.   Email completed "Food and Activity Tracker" next week to be reviewed by Lifestyle Coach.

## 2019-05-30 ENCOUNTER — Encounter: Payer: Self-pay | Admitting: Registered"

## 2019-05-30 ENCOUNTER — Encounter (HOSPITAL_BASED_OUTPATIENT_CLINIC_OR_DEPARTMENT_OTHER): Payer: Medicare HMO | Admitting: Registered"

## 2019-05-30 DIAGNOSIS — R7303 Prediabetes: Secondary | ICD-10-CM | POA: Diagnosis not present

## 2019-05-30 NOTE — Progress Notes (Signed)
On 05/30/2019 patient completed Core Session 14 of Diabetes Prevention Program course virtually with [facility]. By the end of this session patients are able to complete the following objectives:   Learning Objectives:  Give examples of problem social cues and helpful social cues.   Explain how to remove problem social cues and add helpful ones.   Describe ways of coping with vacations and social events such as parties, holidays, and visits from relatives and friends.   Create an action plan to change a problem social cue and add a helpful one.   Goals:   Record weight taken outside of class.   Track foods and beverages eaten each day in the "Food and Activity Tracker," including calories and fat grams for each item.    Track activity type, minutes you were active, and distance you reached each day in the "Food and Activity Tracker."   Do your best to reach activity goal for the week.  Use action plan created during session to change a problem social cue and add a helpful social cue.   Answer questions regarding success of changing social cues on "To Do Next Week" handout.   Follow-Up Plan:  Attend Core Session 15 next week.   Email completed "Food and Activity Tracker" next week to be reviewed by Lifestyle Coach.

## 2019-06-06 ENCOUNTER — Encounter: Payer: Medicare HMO | Attending: Family | Admitting: Registered"

## 2019-06-06 ENCOUNTER — Encounter: Payer: Self-pay | Admitting: Registered"

## 2019-06-06 DIAGNOSIS — R7303 Prediabetes: Secondary | ICD-10-CM

## 2019-06-06 NOTE — Progress Notes (Signed)
On 06/06/2019 pt completed Core Session 15 of Diabetes Prevention Program course virtually with [facility]. By the end of this session patients are able to complete the following objectives:   Learning Objectives:  Explain how to prevent stress or cope with unavoidable stress.   Describe how this program can be a source of stress.   Explain how to manage stressful situations.   Create and follow an action plan for either preventing or coping with a stressful situation.   Goals:   Record weight taken outside of class.   Track foods and beverages eaten each day in the "Food and Activity Tracker," including calories and fat grams for each item.    Track activity type, minutes you were active, and distance you reached each day in the "Food and Activity Tracker."   Do your best to reach activity goal for the week.  Follow your action plan to reduce stress.   Answer questions on handout regarding success of action plan.   Follow-Up Plan:  Attend Core Session 16 next week.   Email completed "Food and Activity Tracker" next week to be reviewed by Lifestyle Coach.

## 2019-06-18 ENCOUNTER — Other Ambulatory Visit: Payer: Self-pay | Admitting: Internal Medicine

## 2019-06-18 DIAGNOSIS — I10 Essential (primary) hypertension: Secondary | ICD-10-CM

## 2019-06-20 ENCOUNTER — Encounter: Payer: Medicare HMO | Attending: Family | Admitting: Registered"

## 2019-06-20 ENCOUNTER — Encounter: Payer: Self-pay | Admitting: Registered"

## 2019-06-20 DIAGNOSIS — R7303 Prediabetes: Secondary | ICD-10-CM | POA: Diagnosis not present

## 2019-06-20 NOTE — Progress Notes (Signed)
On 06/20/2019 pt completed Core Session 16 of Diabetes Prevention Program course virtually with [facility]. By the end of this session patients are able to complete the following objectives:   Learning Objectives:  Measure their progress toward weight and physical activity goals since Session 1.   Develop a plan for improving progress, if their goals have not yet been attained.   Describe ways to stay motivated long-term.   Goals:   Record weight taken outside of class.   Track foods and beverages eaten each day in the "Food and Activity Tracker," including calories and fat grams for each item.    Track activity type, minutes you were active, and distance you reached each day in the "Food and Activity Tracker."   Utilize action plan to help stay motivated and complete questions on "To Do List."   Follow-Up Plan:  Attend session 17.   Bring completed "Food and Activity Tracker" next session to be reviewed by Lifestyle Coach.

## 2019-06-23 DIAGNOSIS — H4031X2 Glaucoma secondary to eye trauma, right eye, moderate stage: Secondary | ICD-10-CM | POA: Diagnosis not present

## 2019-06-27 ENCOUNTER — Encounter (HOSPITAL_BASED_OUTPATIENT_CLINIC_OR_DEPARTMENT_OTHER): Payer: Medicare HMO | Admitting: Registered"

## 2019-06-27 DIAGNOSIS — R7303 Prediabetes: Secondary | ICD-10-CM | POA: Diagnosis not present

## 2019-06-28 ENCOUNTER — Encounter: Payer: Self-pay | Admitting: Registered"

## 2019-06-28 NOTE — Progress Notes (Signed)
On 06/27/2019 pt completed a session of the Diabetes Prevention Program course virtually with [facility]. By the end of this session patients are able to complete the following objectives:   Learning Objectives:  Describe the importance of having regular meals each day and how skipping meals can negatively affect food choices and weight.   Plan out balanced meals and snacks.  List ways to avoid unplanned snacking.   Goals:   Record weight taken outside of class.   Track foods and beverages eaten each day in the "Food and Activity Tracker," including calories and fat grams for each item.    Track activity type, minutes you were active, and distance you reached each day in the "Food and Activity Tracker."   Follow-Up Plan:  Attend next session.   Email completed "Food and Activity Trackers" before next session to be reviewed by Lifestyle Coach.

## 2019-07-04 ENCOUNTER — Encounter: Payer: Medicare HMO | Admitting: Registered"

## 2019-07-04 ENCOUNTER — Encounter: Payer: Self-pay | Admitting: Registered"

## 2019-07-04 DIAGNOSIS — R7303 Prediabetes: Secondary | ICD-10-CM

## 2019-07-04 NOTE — Progress Notes (Signed)
On 07/04/2019 patient attended a virtual grocery store tour session as part of the Diabetes Prevention Program with [facility].   Learning Objectives:  Develop a plan for our grocery shopping experience  Putting together a list  How to navigate the grocery store  Identify 4 main sections of the grocery store  Produce  Meat/Poultry/Fish  [place]  Consider tips for shopping in each of these four sections  Reflect on our own shopping habits  Create a new goal for our next grocery shopping experience  Engage in a group discussion  Goals:   Record weight taken outside of class.   Track foods and beverages eaten each day in the "Food and Activity Tracker," including calories and fat grams for each item.   Create one new goal for the next grocery shopping experience based on the information provided today  Follow-Up Plan:  Attend next session.   Email completed "Food and Activity Tracker" before next session to be reviewed by Lifestyle Coach.

## 2019-07-14 ENCOUNTER — Encounter: Payer: Self-pay | Admitting: Family

## 2019-07-25 ENCOUNTER — Encounter: Payer: Medicare HMO | Attending: Family | Admitting: Registered"

## 2019-07-25 ENCOUNTER — Encounter: Payer: Self-pay | Admitting: Registered"

## 2019-07-25 DIAGNOSIS — R7303 Prediabetes: Secondary | ICD-10-CM | POA: Diagnosis not present

## 2019-07-25 NOTE — Progress Notes (Signed)
On 07/25/19 pt completed Session 17 of the Diabetes Prevention Program course virtually with [facility]. By the end of this session patients are able to complete the following objectives:   Learning Objectives:  Identify how to maintain and/or continue working toward program goals for the remainder of the program.   Describe ways that food and activity tracking can assist them in maintaining/reaching program goals.   Identify progress they have made since the beginning of the program.   Goals:   Record weight taken outside of class.   Track foods and beverages eaten each day in the "Food and Activity Tracker," including calories and fat grams for each item.    Track activity type, minutes you were active, and distance you reached each day in the "Food and Activity Tracker."   Follow-Up Plan:  Attend session 18.   Email completed "Food and Activity Trackers" before next session to be reviewed by Lifestyle Coach.

## 2019-07-31 DIAGNOSIS — R69 Illness, unspecified: Secondary | ICD-10-CM | POA: Diagnosis not present

## 2019-08-08 ENCOUNTER — Encounter (HOSPITAL_BASED_OUTPATIENT_CLINIC_OR_DEPARTMENT_OTHER): Payer: Medicare HMO | Admitting: Registered"

## 2019-08-08 DIAGNOSIS — R7303 Prediabetes: Secondary | ICD-10-CM | POA: Diagnosis not present

## 2019-08-14 ENCOUNTER — Ambulatory Visit: Payer: Medicare HMO

## 2019-08-14 ENCOUNTER — Encounter: Payer: Self-pay | Admitting: Registered"

## 2019-08-14 NOTE — Progress Notes (Signed)
On 08/08/2019 patient completed Session 18 of Diabetes Prevention Program course virtually with [facility]. By the end of this session patients are able to complete the following objectives:   Learning Objectives:  Explain how glucose is used in the body and it's relationship with insulin/insulin resistance.   Identify symptoms of diabetes.   Describe lab tests used to diagnose diabetes.   Describe health complications and conditions related to diabetes.   Goals:   Record weight taken outside of class.   Track foods and beverages eaten each day in the "Food and Activity Tracker," including calories and fat grams for each item.    Track activity type, minutes you were active, and distance you reached each day in the "Food and Activity Tracker."   Follow-Up Plan:  Attend session 19 in two weeks.   Email completed "Food and Activity Trackers" to next session to be reviewed by Lifestyle Coach.

## 2019-08-22 ENCOUNTER — Encounter: Payer: Medicare HMO | Attending: Family | Admitting: Registered"

## 2019-08-22 ENCOUNTER — Ambulatory Visit: Payer: Medicare HMO

## 2019-08-22 DIAGNOSIS — R7303 Prediabetes: Secondary | ICD-10-CM | POA: Diagnosis not present

## 2019-08-25 ENCOUNTER — Other Ambulatory Visit: Payer: Self-pay

## 2019-08-25 ENCOUNTER — Other Ambulatory Visit: Payer: Self-pay | Admitting: Family

## 2019-08-25 ENCOUNTER — Ambulatory Visit (INDEPENDENT_AMBULATORY_CARE_PROVIDER_SITE_OTHER): Payer: Medicare HMO

## 2019-08-25 ENCOUNTER — Ambulatory Visit (INDEPENDENT_AMBULATORY_CARE_PROVIDER_SITE_OTHER): Payer: Medicare HMO | Admitting: Family

## 2019-08-25 ENCOUNTER — Encounter: Payer: Self-pay | Admitting: Family

## 2019-08-25 VITALS — BP 124/78 | HR 69 | Temp 98.0°F | Ht 72.0 in | Wt 220.4 lb

## 2019-08-25 DIAGNOSIS — R011 Cardiac murmur, unspecified: Secondary | ICD-10-CM

## 2019-08-25 DIAGNOSIS — E782 Mixed hyperlipidemia: Secondary | ICD-10-CM | POA: Diagnosis not present

## 2019-08-25 DIAGNOSIS — R05 Cough: Secondary | ICD-10-CM

## 2019-08-25 DIAGNOSIS — R7303 Prediabetes: Secondary | ICD-10-CM | POA: Diagnosis not present

## 2019-08-25 DIAGNOSIS — I10 Essential (primary) hypertension: Secondary | ICD-10-CM

## 2019-08-25 DIAGNOSIS — R053 Chronic cough: Secondary | ICD-10-CM

## 2019-08-25 LAB — BRAIN NATRIURETIC PEPTIDE: Pro B Natriuretic peptide (BNP): 39 pg/mL (ref 0.0–100.0)

## 2019-08-25 LAB — CBC WITH DIFFERENTIAL/PLATELET
Basophils Absolute: 0 10*3/uL (ref 0.0–0.1)
Basophils Relative: 0.7 % (ref 0.0–3.0)
Eosinophils Absolute: 0.2 10*3/uL (ref 0.0–0.7)
Eosinophils Relative: 2.7 % (ref 0.0–5.0)
HCT: 47.5 % (ref 39.0–52.0)
Hemoglobin: 16.3 g/dL (ref 13.0–17.0)
Lymphocytes Relative: 24.6 % (ref 12.0–46.0)
Lymphs Abs: 1.7 10*3/uL (ref 0.7–4.0)
MCHC: 34.3 g/dL (ref 30.0–36.0)
MCV: 89.4 fl (ref 78.0–100.0)
Monocytes Absolute: 0.6 10*3/uL (ref 0.1–1.0)
Monocytes Relative: 8.2 % (ref 3.0–12.0)
Neutro Abs: 4.3 10*3/uL (ref 1.4–7.7)
Neutrophils Relative %: 63.8 % (ref 43.0–77.0)
Platelets: 188 10*3/uL (ref 150.0–400.0)
RBC: 5.31 Mil/uL (ref 4.22–5.81)
RDW: 13.4 % (ref 11.5–15.5)
WBC: 6.8 10*3/uL (ref 4.0–10.5)

## 2019-08-25 LAB — COMPREHENSIVE METABOLIC PANEL
ALT: 22 U/L (ref 0–53)
AST: 17 U/L (ref 0–37)
Albumin: 4.2 g/dL (ref 3.5–5.2)
Alkaline Phosphatase: 105 U/L (ref 39–117)
BUN: 27 mg/dL — ABNORMAL HIGH (ref 6–23)
CO2: 26 mEq/L (ref 19–32)
Calcium: 9.1 mg/dL (ref 8.4–10.5)
Chloride: 107 mEq/L (ref 96–112)
Creatinine, Ser: 0.81 mg/dL (ref 0.40–1.50)
GFR: 93.24 mL/min (ref >60.00)
Glucose, Bld: 112 mg/dL — ABNORMAL HIGH (ref 70–99)
Potassium: 4.1 mEq/L (ref 3.5–5.1)
Sodium: 139 mEq/L (ref 135–145)
Total Bilirubin: 0.8 mg/dL (ref 0.2–1.2)
Total Protein: 6.9 g/dL (ref 6.0–8.3)

## 2019-08-25 LAB — LIPID PANEL
Cholesterol: 133 mg/dL (ref 0–200)
HDL: 37.3 mg/dL — ABNORMAL LOW (ref >39.00)
LDL Cholesterol: 76 mg/dL (ref 0–99)
NonHDL: 95.27
Total CHOL/HDL Ratio: 4
Triglycerides: 98 mg/dL (ref 0.0–149.0)
VLDL: 19.6 mg/dL (ref 0.0–40.0)

## 2019-08-25 LAB — HEMOGLOBIN A1C: Hgb A1c MFr Bld: 5.3 % (ref 4.6–6.5)

## 2019-08-25 MED ORDER — PANTOPRAZOLE SODIUM 40 MG PO TBEC: 40.0000 mg | DELAYED_RELEASE_TABLET | Freq: Two times a day (BID) | ORAL | 1 refills | Status: DC

## 2019-08-25 MED ORDER — LOSARTAN POTASSIUM 100 MG PO TABS: 100.0000 mg | ORAL_TABLET | Freq: Every day | ORAL | 3 refills | Status: DC

## 2019-08-25 NOTE — Addendum Note (Signed)
Addended by: Marcina Millard on: 08/25/2019 01:34 PM   Modules accepted: Orders

## 2019-08-25 NOTE — Progress Notes (Signed)
[name] is a 74 y.o. male with the following history as recorded in Epic[facility]:  Patient Active Problem List   Diagnosis Date Noted  . Encounter for general adult medical examination with abnormal findings 01/06/2016  . Prostate cancer ([place]) 10/16/2012  . DEGENERATIVE JOINT DISEASE, BOTH KNEES, SEVERE 02/17/2010  . Essential hypertension 01/07/2009  . Diverticulosis of large intestine 01/07/2009  . Hyperglycemia 01/07/2009  . GLAUCOMA, PRIMARY OPEN-ANGLE 02/25/2008  . ARTHRITIS 02/25/2008  . HYPERLIPIDEMIA 07/26/2007  . Esophagitis 07/26/2007    Current Outpatient Medications  Medication Sig Dispense Refill  . aspirin 81 MG tablet Take 81 mg by mouth daily.    . dorzolamide-timolol (COSOPT) 22.3-6.8 MG/ML ophthalmic solution 1 drop 2 (two) times daily.    Marland Kitchen losartan (COZAAR) 100 MG tablet Take 1 tablet (100 mg total) by mouth daily. 90 tablet 3  . prednisoLONE acetate (PRED FORTE) 1 % ophthalmic suspension     . PROLENSA 0.07 % SOLN     . simvastatin (ZOCOR) 20 MG tablet Take 1 tablet (20 mg total) by mouth at bedtime. 90 tablet 3  . pantoprazole (PROTONIX) 40 MG tablet Take 1 tablet (40 mg total) by mouth 2 (two) times daily before a meal. 60 tablet 1   No current facility-administered medications for this visit.    Allergies: Penicillins, Lisinopril, and Other  Past Medical History:  Diagnosis Date  . Allergy   . Arthritis   . Erectile dysfunction   . Glaucoma    OD only; from remote trauma (open angle)  . Heart murmur   . Hyperlipidemia   . Hypertension   . Prostate cancer ([place]) 10/16/12   Adenocarcinoma    Past Surgical History:  Procedure Laterality Date  . CATARACT EXTRACTION Left 06/20/2018  . COLONOSCOPY  2009   Dr Deatra Ina; due 2019  . LYMPHADENECTOMY Bilateral 02/27/2013   Procedure: LYMPHADENECTOMY;  Surgeon: Dutch Gray, MD;  Location: [facility];  Service: Urology;  Laterality: Bilateral;  . ROBOT ASSISTED LAPAROSCOPIC RADICAL PROSTATECTOMY N/A 02/27/2013    Procedure: ROBOTIC ASSISTED LAPAROSCOPIC RADICAL PROSTATECTOMY LEVEL 2;  Surgeon: Dutch Gray, MD;  Location: [facility];  Service: Urology;  Laterality: N/A;  . TONSILLECTOMY     age 2 Dr.Yut  . TOTAL KNEE ARTHROPLASTY  07/2010   Dr Maureen Ralphs  . undescended testicle     age 25    Family History  Problem Relation Age of Onset  . Heart attack Mother 37  . Stroke Father 42  . Osteoarthritis Brother   . Cancer Neg Hx   . Diabetes Neg Hx     Social History   Tobacco Use  . Smoking status: Never Smoker  . Smokeless tobacco: Never Used  Substance Use Topics  . Alcohol use: No    Subjective:  6 month follow up on chronic care needs including:  1) Hypertension; 2) Hyperlipidemia; 3) Pre-diabetes; in baseline state of health today with no complaints; does work with nutrition to help with dietary approach to pre-diabetes; has lost almost 50 pounds in the past year;  4) History of prostate cancer- sees urology annually; PSA managed there; considering having male bladder sling surgery done; 5) ? GERD symptoms have re-flared; complaining of chronic dry cough; occasionally feels short of breath with walking; similar symptoms discussed in 06/2018- CXR and EKG at that time were normal; tried Omeprazole for short period with limited change in symptoms.   Objective:  Vitals:   08/25/19 0959  BP: 124/78  Pulse: 69  Temp: 98  F (36.7 C)  TempSrc: Oral  SpO2: 97%  Weight: 220 lb 6.4 oz (100 kg)  Height: 6' (1.829 m)    General: Well developed, well nourished, in no acute distress  Skin : Warm and dry.  Head: Normocephalic and atraumatic  Lungs: Respirations unlabored; clear to auscultation bilaterally without wheeze, rales, rhonchi  CVS exam: normal rate and regular rhythm. Murmur noted Neurologic: Alert and oriented; speech intact; face symmetrical; moves all extremities well; CNII-XII intact without focal deficit   Assessment:  1. HYPERLIPIDEMIA   2. Essential hypertension   3.  Pre-diabetes   4. Chronic cough     Plan:  1. Check lipid panel today; continue same medication; 2. Update EKG- no acute changes noted; check CBC, CMP today; continue Losartan; will plan to refer to cardiology due to new onset murmur; 3. Congratulated patient on commitment to health- has lost almost 50 pounds and goal weight is 200 pounds; check Hgba1c; 4. Update CXR and BNP today; trial of Protonix bid to treat suspected GERD symptoms;  This visit occurred during the SARS-CoV-2 public health emergency.  Safety protocols were in place, including screening questions prior to the visit, additional usage of staff PPE, and extensive cleaning of exam room while observing appropriate contact time as indicated for disinfecting solutions.     No follow-ups on file.  Orders Placed This Encounter  Procedures  . DG Chest 2 View    Order Specific Question:   Reason for Exam (SYMPTOM  OR DIAGNOSIS REQUIRED)    Answer:   chronic cough    Order Specific Question:   Preferred imaging location?    Answer:   [facility]    Order Specific Question:   Radiology Contrast Protocol - do NOT remove file path    Answer:   [email or web address]  . CBC w/Diff  . Comp Met (CMET)  . Lipid panel  . HgB A1c  . B Nat Peptide    Requested Prescriptions   Signed Prescriptions Disp Refills  . losartan (COZAAR) 100 MG tablet 90 tablet 3    Sig: Take 1 tablet (100 mg total) by mouth daily.  . pantoprazole (PROTONIX) 40 MG tablet 60 tablet 1    Sig: Take 1 tablet (40 mg total) by mouth 2 (two) times daily before a meal.

## 2019-08-26 ENCOUNTER — Encounter: Payer: Self-pay | Admitting: Family

## 2019-08-28 ENCOUNTER — Encounter: Payer: Self-pay | Admitting: Registered"

## 2019-08-28 NOTE — Progress Notes (Signed)
On 08/22/19 pt completed a post core session of the Diabetes Prevention Program course virtually with [facility]. By the end of this session patients are able to complete the following objectives:   Learning Objectives:  Counter self-defeating thoughts with positive self-statements  Define assertiveness.   List examples of ways to practice assertiveness.   Goals:   Record weight taken outside of class.   Track foods and beverages eaten each day in the "Food and Activity Tracker," including calories and fat grams for each item.    Track activity type, minutes you were active, and distance you reached each day in the "Food and Activity Tracker."   Follow-Up Plan:  Attend next session.   Email completed "Food and Activity Trackers" before next session to be reviewed by Lifestyle Coach.

## 2019-09-04 ENCOUNTER — Ambulatory Visit: Payer: Medicare HMO

## 2019-09-05 ENCOUNTER — Encounter: Payer: Self-pay | Admitting: Registered"

## 2019-09-05 ENCOUNTER — Encounter (HOSPITAL_BASED_OUTPATIENT_CLINIC_OR_DEPARTMENT_OTHER): Payer: Medicare HMO | Admitting: Registered"

## 2019-09-05 DIAGNOSIS — R7303 Prediabetes: Secondary | ICD-10-CM | POA: Diagnosis not present

## 2019-09-05 NOTE — Progress Notes (Signed)
On 09/05/19 pt completed a session of the Diabetes Prevention Program course virtually with [facility]. By the end of this session patients are able to complete the following objectives:   Learning Objectives:  List risk factors for heart disease.   Define the difference between HDL and LDL cholesterol  List ways to reduce risk for heart disease.   Goals:   Record weight taken outside of class.   Track foods and beverages eaten each day in the "Food and Activity Tracker," including calories and fat grams for each item.    Track activity type, minutes you were active, and distance you reached each day in the "Food and Activity Tracker."   Follow-Up Plan:  Attend next session.   Email completed "Food and Activity Trackers" before next session to be reviewed by Lifestyle Coach.

## 2019-09-10 ENCOUNTER — Other Ambulatory Visit: Payer: Self-pay

## 2019-09-10 ENCOUNTER — Ambulatory Visit: Payer: Medicare HMO | Admitting: Cardiovascular Disease

## 2019-09-10 ENCOUNTER — Encounter: Payer: Self-pay | Admitting: Cardiovascular Disease

## 2019-09-10 VITALS — BP 142/78 | HR 71 | Ht 73.0 in | Wt 222.0 lb

## 2019-09-10 DIAGNOSIS — R011 Cardiac murmur, unspecified: Secondary | ICD-10-CM

## 2019-09-10 DIAGNOSIS — I10 Essential (primary) hypertension: Secondary | ICD-10-CM | POA: Diagnosis not present

## 2019-09-10 DIAGNOSIS — I340 Nonrheumatic mitral (valve) insufficiency: Secondary | ICD-10-CM | POA: Diagnosis not present

## 2019-09-10 NOTE — Patient Instructions (Addendum)
Your physician recommends that you continue on your current medications as directed. Please refer to the Current Medication list given to you today.   Your physician has requested that you have an echocardiogram. Echocardiography is a painless test that uses sound waves to create images of your heart. It provides your doctor with information about the size and shape of your heart and how well your heart's chambers and valves are working. This procedure takes approximately one hour. There are no restrictions for this procedure.   Your physician wants you to follow-up in: [place] will receive a reminder letter in the mail two months in advance. If you don't receive a letter, please call our office to schedule the follow-up appointment.

## 2019-09-10 NOTE — Progress Notes (Signed)
Cardiology Office Note:    Date:  09/10/2019   ID:  Merlinda Frederick Rumpf, DOB [birth year], MRN [id number]  PCP:  Marrian Salvage, FNP  Cardiologist:  Siddh Vandeventer  Electrophysiologist:  None   Referring MD: Marrian Salvage,*   Chief Complaint  Patient presents with  . Heart Murmur    History of Present Illness:    Harve Mcgruder Stepter is a 74 y.o. male with a hx of heart murmur.  We were asked to see him today by Jodi Mourning, NP for further evaluation of his murmur Hx of HTN HLD Left TKA Prostate Cancer  feels great .   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   [place], [place] regularly 4-5 times a week 30 min - 1 hour Used to weigh 270 - down 50 lbs over the past year Eats a low fat, low carb diet  Mother had CAD - had MI   Past Medical History:  Diagnosis Date  . Allergy   . Arthritis   . ARTHRITIS 02/25/2008   Qualifier: Diagnosis of  By: Marland Mcalpine    . DEGENERATIVE JOINT DISEASE, BOTH KNEES, SEVERE 02/17/2010   Qualifier: Diagnosis of  By: Elna Breslow    . Diverticulosis of large intestine 01/07/2009   Qualifier: Diagnosis of  By: Linna Darner MD, Gwyndolyn Saxon   Colonoscopy 2009;Dr  Kaplan,[facility]    . Erectile dysfunction   . Esophagitis 07/26/2007   Qualifier: Diagnosis of  By: Linna Darner MD, Gwyndolyn Saxon    . Essential hypertension 01/07/2009   Qualifier: Diagnosis of  By: Linna Darner MD, Gwyndolyn Saxon    . Glaucoma    OD only; from remote trauma (open angle)  . GLAUCOMA, PRIMARY OPEN-ANGLE 02/25/2008   Qualifier: Diagnosis of  By: Marland Mcalpine   OD only;Dr Dionne Ano, [place]    . Heart murmur   . Hyperglycemia 01/07/2009   Qualifier: Diagnosis of  By: Linna Darner MD, Gwyndolyn Saxon   No FH DM   . Hyperlipidemia   . HYPERLIPIDEMIA 07/26/2007   Qualifier: Diagnosis of  By: Linna Darner MD, Cristopher Estimable Lipoprofile 2010: LDL 93 (1800/ 1237), HDL 38, TG 153.  Minimal LDL goal = < 100, preferred goal = < 70.  Mother MI @ 36. Father CVA @ 58    . Hypertension   . Prostate cancer ([place]) 10/16/12   Adenocarcinoma    Past Surgical History:  Procedure Laterality Date  . CATARACT EXTRACTION Left 06/20/2018  . COLONOSCOPY  2009   Dr Deatra Ina; due 2019  . LYMPHADENECTOMY Bilateral 02/27/2013   Procedure: LYMPHADENECTOMY;  Surgeon: Dutch Gray, MD;  Location: [facility];  Service: Urology;  Laterality: Bilateral;  . ROBOT ASSISTED LAPAROSCOPIC RADICAL PROSTATECTOMY N/A 02/27/2013   Procedure: ROBOTIC ASSISTED LAPAROSCOPIC RADICAL PROSTATECTOMY LEVEL 2;  Surgeon: Dutch Gray, MD;  Location: [facility];  Service: Urology;  Laterality: N/A;  . TONSILLECTOMY     age 20 Dr.Yut  . TOTAL KNEE ARTHROPLASTY  07/2010   Dr Maureen Ralphs  . undescended testicle     age 50    Current Medications: Current Meds  Medication Sig  . aspirin 81 MG tablet Take 81 mg by mouth daily.  . dorzolamide-timolol (COSOPT) 22.3-6.8 MG/ML ophthalmic solution 1 drop 2 (two) times daily.  Marland Kitchen losartan (COZAAR) 100 MG tablet Take 1 tablet (100 mg total) by mouth daily.  . pantoprazole (PROTONIX) 40 MG tablet Take 1  tablet (40 mg total) by mouth 2 (two) times daily before a meal.  . simvastatin (ZOCOR) 20 MG tablet Take 1 tablet (20 mg total) by mouth at bedtime.  . [DISCONTINUED] prednisoLONE acetate (PRED FORTE) 1 % ophthalmic suspension      Allergies:   Penicillins, Lisinopril, and Other   Social History   Socioeconomic History  . Marital status: Married    Spouse name: Not on file  . Number of children: 1  . Years of education: 32  . Highest education level: Not on file  Occupational History  . Occupation: AUDIOLOGIST    Employer: [employer]  Tobacco Use  . Smoking status: Never Smoker  . Smokeless tobacco: Never Used  Substance and Sexual Activity  . Alcohol use: No  . Drug use: No  . Sexual activity: Not Currently  Other Topics Concern  . Not on file  Social History Narrative   Married audiologist   Plans to retire soon.    Social Determinants of Health   Financial Resource Strain:   . Difficulty of Paying Living Expenses: Not on file  Food Insecurity:   . Worried About Charity fundraiser in the Last Year: Not on file  . Ran Out of Food in the Last Year: Not on file  Transportation Needs:   . Lack of Transportation (Medical): Not on file  . Lack of Transportation (Non-Medical): Not on file  Physical Activity:   . Days of Exercise per Week: Not on file  . Minutes of Exercise per Session: Not on file  Stress:   . Feeling of Stress : Not on file  Social Connections:   . Frequency of Communication with Friends and Family: Not on file  . Frequency of Social Gatherings with Friends and Family: Not on file  . Attends Religious Services: Not on file  . Active Member of Clubs or Organizations: Not on file  . Attends Archivist Meetings: Not on file  . Marital Status: Not on file     Family History: The patient's family history includes Heart attack (age of onset: 63) in his mother; Osteoarthritis in his brother; Stroke (age of onset: 26) in his father. There is no history of Cancer or Diabetes.  ROS:   Please see the history of present illness.     All other systems reviewed and are negative.  EKGs/Labs/Other Studies Reviewed:    The following studies were reviewed today:    EKG:     Recent Labs: 08/25/2019: ALT 22; BUN 27; Creatinine, Ser 0.81; Hemoglobin 16.3; Platelets 188.0; Potassium 4.1; Pro B Natriuretic peptide (BNP) 39.0; Sodium 139  Recent Lipid Panel    Component Value Date/Time   CHOL 133 08/25/2019 1100   TRIG 98.0 08/25/2019 1100   HDL 37.30 (L) 08/25/2019 1100   CHOLHDL 4 08/25/2019 1100   VLDL 19.6 08/25/2019 1100   LDLCALC 76 08/25/2019 1100    Physical Exam:    VS:  BP (!) 142/78   Pulse 71   Ht 6\' 1"  (1.854 m)   Wt 222 lb (100.7 kg)   SpO2 96%   BMI 29.29 kg/m     Wt Readings from Last 3 Encounters:  09/10/19 222 lb (100.7 kg)  08/25/19 220 lb 6.4 oz  (100 kg)  01/03/19 247 lb (112 kg)     GEN:  Well nourished, well developed in no acute distress HEENT: Normal NECK: No JVD; No carotid bruits LYMPHATICS: No lymphadenopathy CARDIAC 1/6 to 2/6 systolic murmur  at the left sternal border radiating partially to the axilla.  There is no radiation to the right sternal border and no radiation radiation to the upper left sternal border. RESPIRATORY:  Clear to auscultation without rales, wheezing or rhonchi  ABDOMEN: Soft, non-tender, non-distended MUSCULOSKELETAL:  No edema; No deformity  SKIN: Warm and dry NEUROLOGIC:  Alert and oriented x 3 PSYCHIATRIC:  Normal affect   ASSESSMENT:    1. Essential hypertension   2. Murmur    PLAN:    In order of problems listed above:  1. Systolic murmur: His exam is consistent with mitral regurgitation.  I cannot completely rule out tricuspid regurgitation but the murmur seems to radiate to the axilla.  There is no radiation to the right sternal border or carotid so I doubt he has aortic stenosis.  His pulse pressures are normal.  We will get an echocardiogram for further evaluation.  I will plan on seeing him again in 1 year.  2.  Hyperlipidemia: His lipids are fairly well controlled.  He is on simvastatin 20 mg a day.  He is lost quite a bit of weight.  I encouraged him to continue with good diet exercise and weight loss program.  He is lost over 50 pounds this year.   Medication Adjustments/Labs and Tests Ordered: Current medicines are reviewed at length with the patient today.  Concerns regarding medicines are outlined above.  Orders Placed This Encounter  Procedures  . ECHOCARDIOGRAM COMPLETE   No orders of the defined types were placed in this encounter.   Patient Instructions  Your physician recommends that you continue on your current medications as directed. Please refer to the Current Medication list given to you today.   Your physician has requested that you have an echocardiogram.  Echocardiography is a painless test that uses sound waves to create images of your heart. It provides your doctor with information about the size and shape of your heart and how well your heart's chambers and valves are working. This procedure takes approximately one hour. There are no restrictions for this procedure.   Your physician wants you to follow-up in: [place] will receive a reminder letter in the mail two months in advance. If you don't receive a letter, please call our office to schedule the follow-up appointment.      Signed, Mertie Moores, MD  09/10/2019 9:20 AM    [place]

## 2019-09-19 ENCOUNTER — Encounter: Payer: Self-pay | Admitting: Registered"

## 2019-09-19 ENCOUNTER — Encounter: Payer: Medicare HMO | Attending: Family | Admitting: Registered"

## 2019-09-19 DIAGNOSIS — R7303 Prediabetes: Secondary | ICD-10-CM | POA: Insufficient documentation

## 2019-09-19 NOTE — Progress Notes (Signed)
On 09/19/2019 patient completed a session of the Diabetes Prevention Program course virtually with [facility]. By the end of this session patients are able to complete the following objectives:   Learning Objectives:  Describe how to incorporate more fruits and vegetables into meals.  List criteria for selecting good quality fruits and vegetables at the store.   Define mindful eating.  List the benefits of eating mindfully.   Goals:   Record weight taken outside of class.   Track foods and beverages eaten each day in the "Food and Activity Tracker," including calories and fat grams for each item.    Track activity type, minutes you were active, and distance you reached each day in the "Food and Activity Tracker."   Follow-Up Plan:  Attend next session.   Email completed "Food and Activity Trackers" before next session to be reviewed by Lifestyle Coach.

## 2019-10-01 ENCOUNTER — Other Ambulatory Visit: Payer: Self-pay

## 2019-10-01 ENCOUNTER — Ambulatory Visit (HOSPITAL_COMMUNITY): Payer: Medicare HMO | Attending: Internal Medicine

## 2019-10-01 DIAGNOSIS — R011 Cardiac murmur, unspecified: Secondary | ICD-10-CM | POA: Diagnosis not present

## 2019-10-03 ENCOUNTER — Encounter (HOSPITAL_BASED_OUTPATIENT_CLINIC_OR_DEPARTMENT_OTHER): Payer: Medicare HMO | Admitting: Registered"

## 2019-10-03 DIAGNOSIS — R7303 Prediabetes: Secondary | ICD-10-CM

## 2019-10-08 ENCOUNTER — Encounter: Payer: Self-pay | Admitting: Registered"

## 2019-10-08 NOTE — Progress Notes (Signed)
On 10/03/19 pt completed a post core session of the Diabetes Prevention Program course virtually with [facility]. By the end of this session patients are able to complete the following objectives:   Learning Objectives:  List ways to make recipes healthier.   List lower-fat and lower-calorie substitutions for common ingredients.   Identify low-fat cooking methods.   Describe how to choose a cookbook that works best for their needs.   Goals:   Record weight taken outside of class.   Track foods and beverages eaten each day in the "Food and Activity Tracker," including calories and fat grams for each item.    Track activity type, minutes you were active, and distance you reached each day in the "Food and Activity Tracker."   Follow-Up Plan:  Attend next session.   Email completed "Food and Activity Trackers" before next session to be reviewed by Lifestyle Coach.

## 2019-10-13 DIAGNOSIS — D492 Neoplasm of unspecified behavior of bone, soft tissue, and skin: Secondary | ICD-10-CM | POA: Diagnosis not present

## 2019-10-13 DIAGNOSIS — D485 Neoplasm of uncertain behavior of skin: Secondary | ICD-10-CM | POA: Diagnosis not present

## 2019-10-21 ENCOUNTER — Other Ambulatory Visit: Payer: Self-pay | Admitting: Family

## 2019-10-21 MED ORDER — PANTOPRAZOLE SODIUM 40 MG PO TBEC: 40.0000 mg | DELAYED_RELEASE_TABLET | Freq: Two times a day (BID) | ORAL | 1 refills | Status: DC

## 2019-11-10 DIAGNOSIS — D2339 Other benign neoplasm of skin of other parts of face: Secondary | ICD-10-CM | POA: Diagnosis not present

## 2019-11-10 DIAGNOSIS — D485 Neoplasm of uncertain behavior of skin: Secondary | ICD-10-CM | POA: Diagnosis not present

## 2019-11-14 DIAGNOSIS — H53002 Unspecified amblyopia, left eye: Secondary | ICD-10-CM | POA: Diagnosis not present

## 2019-11-14 DIAGNOSIS — H524 Presbyopia: Secondary | ICD-10-CM | POA: Diagnosis not present

## 2019-11-14 DIAGNOSIS — H4031X2 Glaucoma secondary to eye trauma, right eye, moderate stage: Secondary | ICD-10-CM | POA: Diagnosis not present

## 2019-11-14 DIAGNOSIS — H4322 Crystalline deposits in vitreous body, left eye: Secondary | ICD-10-CM | POA: Diagnosis not present

## 2019-11-17 DIAGNOSIS — Z4802 Encounter for removal of sutures: Secondary | ICD-10-CM | POA: Diagnosis not present

## 2019-11-21 ENCOUNTER — Encounter: Payer: Medicare HMO | Attending: Family | Admitting: Registered"

## 2019-11-21 DIAGNOSIS — R7303 Prediabetes: Secondary | ICD-10-CM | POA: Diagnosis not present

## 2019-11-27 ENCOUNTER — Encounter: Payer: Self-pay | Admitting: Registered"

## 2019-11-27 NOTE — Progress Notes (Signed)
On 11/21/19 pt completed a post core session of the Diabetes Prevention Program course virtually with [facility]. By the end of this session patients are able to complete the following objectives:   Learning Objectives:  Define fiber and describe the difference between insoluble and soluble fiber   List foods that are good sources of fiber  Explain the health benefits of fiber   Describe ways to increase volume of meals and snacks while staying within fat goal.   Goals:   Record weight taken outside of class.   Track foods and beverages eaten each day in the "Food and Activity Tracker," including calories and fat grams for each item.    Track activity type, minutes you were active, and distance you reached each day in the "Food and Activity Tracker."   Follow-Up Plan:  Attend next session.   Email completed "Food and Activity Trackers" to next session to be reviewed by Lifestyle Coach.

## 2019-12-19 ENCOUNTER — Encounter: Payer: Self-pay | Admitting: Registered"

## 2019-12-19 ENCOUNTER — Encounter: Payer: Medicare HMO | Attending: Family | Admitting: Registered"

## 2019-12-19 DIAGNOSIS — R7303 Prediabetes: Secondary | ICD-10-CM | POA: Diagnosis not present

## 2019-12-19 NOTE — Progress Notes (Signed)
On 12/19/19 patient completed a Post Core Session of the Diabetes Prevention Program course virtually with [facility]. The following learning objectives were met by the patient during this class:    Learning Objectives:  List past challenges to making positive lifestyle changes.   Describe strategies for positive lifestyle change.   Make a plan for maintaining positive lifestyle changes and continuing toward goals after the program ends.  Goals:   Record weight taken outside of class.   Track foods and beverages eaten each day in the "Food and Activity Tracker," including calories and fat grams for each item.   Track physical activity minutes in the "Food and Activity Tracker."   Complete future healthy lifestyle changes plan.   Follow-Up Plan: . Attend next post core session  . Email completed "Food and Activity Tracker" before next session to be reviewed by Lifestyle Coach.   

## 2019-12-24 DIAGNOSIS — R69 Illness, unspecified: Secondary | ICD-10-CM | POA: Diagnosis not present

## 2020-01-23 ENCOUNTER — Encounter: Payer: Self-pay | Admitting: Registered"

## 2020-01-23 ENCOUNTER — Encounter: Payer: Medicare HMO | Attending: Family | Admitting: Registered"

## 2020-01-23 DIAGNOSIS — R7303 Prediabetes: Secondary | ICD-10-CM | POA: Insufficient documentation

## 2020-01-23 NOTE — Progress Notes (Signed)
On 01/23/20 pt completed a post core session of the Diabetes Prevention Program course virtually with [facility]. By the end of this session patients are able to complete the following objectives:   Learning Objectives:  Describe the difference between a lapse and a relapse.  List steps to prevent a lapse from becoming a relapse.   Identify situations that increase risk of having a lapse.   Make a plan to help prevent lapses and recover after a lapse has occurred.   Goals:   Record weight taken outside of class.   Track foods and beverages eaten each day in the "Food and Activity Tracker," including calories and fat grams for each item.    Track activity type, minutes you were active, and distance you reached each day in the "Food and Activity Tracker."   Follow-Up Plan:  Attend next session.   Email completed "Food and Activity Trackers" before next session to be reviewed by Lifestyle Coach.

## 2020-02-02 DIAGNOSIS — H4031X2 Glaucoma secondary to eye trauma, right eye, moderate stage: Secondary | ICD-10-CM | POA: Diagnosis not present

## 2020-02-05 DIAGNOSIS — R69 Illness, unspecified: Secondary | ICD-10-CM | POA: Diagnosis not present

## 2020-02-20 ENCOUNTER — Encounter: Payer: Medicare HMO | Attending: Family | Admitting: Registered"

## 2020-02-20 DIAGNOSIS — R7303 Prediabetes: Secondary | ICD-10-CM | POA: Insufficient documentation

## 2020-02-28 ENCOUNTER — Encounter: Payer: Self-pay | Admitting: Registered"

## 2020-02-28 NOTE — Progress Notes (Signed)
On 02/20/20 pt completed the final session of the Diabetes Prevention Program course at [facility] virtually with [facility]. By the end of this session patients are able to complete the following objectives:   Learning Objectives:  Reflect on lifestyle changes they have made since starting the DPP.   Set long-term goals to promote continued maintenance of lifestyle changes made during the program.   Goals:   Work toward reaching new long-term goals set during class.   Follow-Up Plan:  Contact Lifestyle Coach with questions/concerns PRN.

## 2020-03-16 ENCOUNTER — Other Ambulatory Visit: Payer: Self-pay | Admitting: Family

## 2020-03-16 DIAGNOSIS — E782 Mixed hyperlipidemia: Secondary | ICD-10-CM

## 2020-04-30 ENCOUNTER — Other Ambulatory Visit: Payer: Self-pay

## 2020-04-30 ENCOUNTER — Ambulatory Visit (INDEPENDENT_AMBULATORY_CARE_PROVIDER_SITE_OTHER): Payer: Medicare HMO | Admitting: *Deleted

## 2020-04-30 DIAGNOSIS — Z23 Encounter for immunization: Secondary | ICD-10-CM | POA: Diagnosis not present

## 2020-04-30 DIAGNOSIS — C61 Malignant neoplasm of prostate: Secondary | ICD-10-CM | POA: Diagnosis not present

## 2020-05-07 DIAGNOSIS — C61 Malignant neoplasm of prostate: Secondary | ICD-10-CM | POA: Diagnosis not present

## 2020-05-07 DIAGNOSIS — N3946 Mixed incontinence: Secondary | ICD-10-CM | POA: Diagnosis not present

## 2020-08-11 DIAGNOSIS — H4031X2 Glaucoma secondary to eye trauma, right eye, moderate stage: Secondary | ICD-10-CM | POA: Diagnosis not present

## 2020-09-16 ENCOUNTER — Encounter: Payer: Self-pay | Admitting: Cardiovascular Disease

## 2020-09-16 NOTE — Progress Notes (Signed)
Cardiology Office Note:    Date:  09/17/2020   ID:  [name], DOB 1945-08-02, MRN [id number]  PCP:  Marrian Salvage, FNP  Cardiologist:  Jadamarie Butson  Electrophysiologist:  None   Referring MD: Marrian Salvage,*   Chief Complaint  Patient presents with  . Hypertension    Previous notes:    [name] is a 75 y.o. male with a hx of heart murmur.  We were asked to see him today by Jodi Mourning, NP for further evaluation of his murmur Hx of HTN HLD Left TKA Prostate Cancer  feels great .   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   [place], [place] regularly 4-5 times a week 30 min - 1 hour Used to weigh 270 - down 50 lbs over the past year Eats a low fat, low carb diet  Mother had CAD - had MI   September 17, 2020: [name] is seen today for follow up of his hypertension and heart murmur. He lost quite a bit of weight last year. Has had some tightness with walking up hills  Seems to be occurring with some regularity  + family hx of CAD , mother died of CAD in 68.    Past Medical History:  Diagnosis Date  . Allergy   . Arthritis   . ARTHRITIS 02/25/2008   Qualifier: Diagnosis of  By: Marland Mcalpine    . DEGENERATIVE JOINT DISEASE, BOTH KNEES, SEVERE 02/17/2010   Qualifier: Diagnosis of  By: Elna Breslow    . Diverticulosis of large intestine 01/07/2009   Qualifier: Diagnosis of  By: Linna Darner MD, Gwyndolyn Saxon   Colonoscopy 2009;Dr  Kaplan,[facility]    . Erectile dysfunction   . Esophagitis 07/26/2007   Qualifier: Diagnosis of  By: Linna Darner MD, Gwyndolyn Saxon    . Essential hypertension 01/07/2009   Qualifier: Diagnosis of  By: Linna Darner MD, Gwyndolyn Saxon    . Glaucoma    OD only; from remote trauma (open angle)  . GLAUCOMA, PRIMARY OPEN-ANGLE 02/25/2008   Qualifier: Diagnosis of  By: Marland Mcalpine   OD only;Dr Dionne Ano, [place]    . Heart murmur   . Hyperglycemia 01/07/2009    Qualifier: Diagnosis of  By: Linna Darner MD, Gwyndolyn Saxon   No FH DM   . Hyperlipidemia   . HYPERLIPIDEMIA 07/26/2007   Qualifier: Diagnosis of  By: Linna Darner MD, Cristopher Estimable Lipoprofile 2010: LDL 93 (1800/ 1237), HDL 38, TG 153.  Minimal LDL goal = < 100, preferred goal = < 70. Mother MI @ 60. Father CVA @ 12    . Hypertension   . Prostate cancer ([place]) 10/16/12   Adenocarcinoma    Past Surgical History:  Procedure Laterality Date  . CATARACT EXTRACTION Left 06/20/2018  . COLONOSCOPY  2009   Dr Deatra Ina; due 2019  . LYMPHADENECTOMY Bilateral 02/27/2013   Procedure: LYMPHADENECTOMY;  Surgeon: Dutch Gray, MD;  Location: [facility];  Service: Urology;  Laterality: Bilateral;  . ROBOT ASSISTED LAPAROSCOPIC RADICAL PROSTATECTOMY N/A 02/27/2013   Procedure: ROBOTIC ASSISTED LAPAROSCOPIC RADICAL PROSTATECTOMY LEVEL 2;  Surgeon: Dutch Gray, MD;  Location: [facility];  Service: Urology;  Laterality: N/A;  . TONSILLECTOMY     age 73 Dr.Yut  . TOTAL KNEE ARTHROPLASTY  07/2010   Dr Maureen Ralphs  . undescended testicle     age 43    Current Medications: Current Meds  Medication Sig  . aspirin 81 MG tablet Take 81 mg by mouth daily.  . dorzolamide-timolol (COSOPT) 22.3-6.8 MG/ML ophthalmic solution 1 drop 2 (two) times daily.  . metoprolol tartrate (LOPRESSOR) 50 MG tablet Take 1 tablet (50 mg total) by mouth once for 1 dose.  . simvastatin (ZOCOR) 20 MG tablet TAKE 1 TABLET BY MOUTH EVERYDAY AT BEDTIME  . [DISCONTINUED] losartan (COZAAR) 100 MG tablet Take 1 tablet (100 mg total) by mouth daily.  . [DISCONTINUED] pantoprazole (PROTONIX) 40 MG tablet Take 1 tablet (40 mg total) by mouth 2 (two) times daily before a meal.     Allergies:   Penicillins, Lisinopril, and Other   Social History   Socioeconomic History  . Marital status: Married    Spouse name: Not on file  . Number of children: 1  . Years of education: 43  . Highest education level: Not on file  Occupational History  . Occupation: AUDIOLOGIST     Employer: [employer]  Tobacco Use  . Smoking status: Never Smoker  . Smokeless tobacco: Never Used  Vaping Use  . Vaping Use: Never used  Substance and Sexual Activity  . Alcohol use: No  . Drug use: No  . Sexual activity: Not Currently  Other Topics Concern  . Not on file  Social History Narrative   Married audiologist   Plans to retire soon.   Social Determinants of Health   Financial Resource Strain: Not on file  Food Insecurity: Not on file  Transportation Needs: Not on file  Physical Activity: Not on file  Stress: Not on file  Social Connections: Not on file     Family History: The patient's family history includes Heart attack (age of onset: 85) in his mother; Osteoarthritis in his brother; Stroke (age of onset: 68) in his father. There is no history of Cancer or Diabetes.  ROS:   Please see the history of present illness.     All other systems reviewed and are negative.  EKGs/Labs/Other Studies Reviewed:    The following studies were reviewed today:    Recent Labs: No results found for requested labs within last 8760 hours.  Recent Lipid Panel    Component Value Date/Time   CHOL 133 08/25/2019 1100   TRIG 98.0 08/25/2019 1100   HDL 37.30 (L) 08/25/2019 1100   CHOLHDL 4 08/25/2019 1100   VLDL 19.6 08/25/2019 1100   LDLCALC 76 08/25/2019 1100    Physical Exam:      Physical Exam: Blood pressure (!) 154/82, pulse 65, height 6\' 1"  (1.854 m), weight 229 lb (103.9 kg), SpO2 98 %.   GEN:  Well nourished, well developed in no acute distress HEENT: Normal NECK: No JVD; No carotid bruits LYMPHATICS: No lymphadenopathy CARDIAC: RRR , very soft murmur  RESPIRATORY:  Clear to auscultation without rales, wheezing or rhonchi  ABDOMEN: Soft, non-tender, non-distended MUSCULOSKELETAL:  No edema; No deformity  SKIN: Warm and dry NEUROLOGIC:  Alert and oriented x 3    EKG:   September 17, 2020: Normal sinus rhythm at 65. First-degree AV block. No ST or T wave  changes.   ASSESSMENT:    1. Essential hypertension   2. Precordial pain    PLAN:     1.  HTN:   BP is typically better.  Cont losartan .  He will send in some BP readings for 2-3 weeks.   2.  Aortic stenosis :   Very mild AS .  Will continue to follow  3.  Hyperlipidemia:  Have been stable   4.  Chest tightness:   Has had some chest tightness while walking up hills.   Seems to be a new issue. Will order a coronary CT angiogram. His HR at baseline is 65 and he has a 1st degree AV block and he is almost 75.   Will order metoprolol 50 mg as a 1 time dose prior to the coronary CT angio.  Medication Adjustments/Labs and Tests Ordered: Current medicines are reviewed at length with the patient today.  Concerns regarding medicines are outlined above.  Orders Placed This Encounter  Procedures  . CT CORONARY MORPH W/CTA COR W/SCORE W/CA W/CM &/OR WO/CM  . CT CORONARY FRACTIONAL FLOW RESERVE DATA PREP  . CT CORONARY FRACTIONAL FLOW RESERVE FLUID ANALYSIS  . EKG 12-Lead   Meds ordered this encounter  Medications  . losartan (COZAAR) 100 MG tablet    Sig: Take 1 tablet (100 mg total) by mouth daily.    Dispense:  90 tablet    Refill:  3  . metoprolol tartrate (LOPRESSOR) 50 MG tablet    Sig: Take 1 tablet (50 mg total) by mouth once for 1 dose.    Dispense:  1 tablet    Refill:  0    Patient Instructions  Medication Instructions:   Your physician recommends that you continue on your current medications as directed. Please refer to the Current Medication list given to you today.  *If you need a refill on your cardiac medications before your next appointment, please call your pharmacy*   Lab Work: None If you have labs (blood work) drawn today and your tests are completely normal, you will receive your results only by: Marland Kitchen MyChart Message (if you have MyChart) OR . A paper copy in the mail If you have any lab test that is abnormal or we need to change your treatment, we will call  you to review the results.   Testing/Procedures:  Your physician has requested that you have cardiac CT.       Follow-Up: At [facility], you and your health needs are our priority.  As part of our continuing mission to provide you with exceptional heart care, we have created designated Provider Care Teams.  These Care Teams include your primary Cardiologist (physician) and Advanced Practice Providers (APPs -  Physician Assistants and Nurse Practitioners) who all work together to provide you with the care you need, when you need it.   Your next appointment:   1 year(s)  The format for your next appointment:   In Person  Provider:   You may see Dr. Acie Fredrickson or one of the following Advanced Practice Providers on your designated Care Team:    Richardson Dopp, PA-C  Vin Victoria, [place]    Other Instructions Your cardiac CT will be scheduled at one of the below locations:   [facility] [address] [place], [place] [address] [phone]   If scheduled at [facility], please arrive at the [facility] main entrance (entrance A) of [facility] 30 minutes prior to test start time. Proceed to the [facility] Radiology Department (first floor) to check-in and test prep.  If scheduled at [facility], please arrive 15 mins early for check-in and test prep.  Please follow these instructions carefully (unless otherwise directed):  Hold all erectile dysfunction medications at least 3 days (72 hrs) prior to test.  On the Night Before the Test: . Be  sure to Drink plenty of water. . Do not consume any caffeinated/decaffeinated beverages or chocolate 12 hours prior to your test. . Do not take any antihistamines 12 hours prior to your test.  On the Day of the Test: . Drink plenty of water until 1 hour prior to the test. . Do not eat any food 4 hours prior to the test. . You may take your regular medications prior to the test.   . Take metoprolol (Lopressor) two hours prior to test. . HOLD Furosemide/Hydrochlorothiazide morning of the test.        After the Test: . Drink plenty of water. . After receiving IV contrast, you may experience a mild flushed feeling. This is normal. . On occasion, you may experience a mild rash up to 24 hours after the test. This is not dangerous. If this occurs, you can take Benadryl 25 mg and increase your fluid intake. . If you experience trouble breathing, this can be serious. If it is severe call 911 IMMEDIATELY. If it is mild, please call our office. . If you take any of these medications: Glipizide/Metformin, Avandament, Glucavance, please do not take 48 hours after completing test unless otherwise instructed.   Once we have confirmed authorization from your insurance company, we will call you to set up a date and time for your test. Based on how quickly your insurance processes prior authorizations requests, please allow up to 4 weeks to be contacted for scheduling your Cardiac CT appointment. Be advised that routine Cardiac CT appointments could be scheduled as many as 8 weeks after your provider has ordered it.  For non-scheduling related questions, please contact the cardiac imaging nurse navigator should you have any questions/concerns: Marchia Bond, Cardiac Imaging Nurse Navigator Gordy Clement, Cardiac Imaging Nurse Navigator [facility] Direct Office Dial: [phone]   For scheduling needs, including cancellations and rescheduling, please call [place], [phone].       Signed, Mertie Moores, MD  09/17/2020 4:43 PM    [place]

## 2020-09-17 ENCOUNTER — Ambulatory Visit: Payer: Medicare HMO | Admitting: Cardiovascular Disease

## 2020-09-17 ENCOUNTER — Encounter: Payer: Self-pay | Admitting: Cardiovascular Disease

## 2020-09-17 ENCOUNTER — Other Ambulatory Visit: Payer: Self-pay

## 2020-09-17 DIAGNOSIS — I10 Essential (primary) hypertension: Secondary | ICD-10-CM | POA: Diagnosis not present

## 2020-09-17 DIAGNOSIS — R0789 Other chest pain: Secondary | ICD-10-CM | POA: Diagnosis not present

## 2020-09-17 DIAGNOSIS — R072 Precordial pain: Secondary | ICD-10-CM

## 2020-09-17 MED ORDER — LOSARTAN POTASSIUM 100 MG PO TABS: 100.0000 mg | ORAL_TABLET | Freq: Every day | ORAL | 3 refills | Status: DC

## 2020-09-17 MED ORDER — METOPROLOL TARTRATE 50 MG PO TABS: 50.0000 mg | ORAL_TABLET | Freq: Once | ORAL | 0 refills | Status: DC

## 2020-09-17 NOTE — Patient Instructions (Signed)
Medication Instructions:   Your physician recommends that you continue on your current medications as directed. Please refer to the Current Medication list given to you today.  *If you need a refill on your cardiac medications before your next appointment, please call your pharmacy*   Lab Work: None If you have labs (blood work) drawn today and your tests are completely normal, you will receive your results only by: Marland Kitchen MyChart Message (if you have MyChart) OR . A paper copy in the mail If you have any lab test that is abnormal or we need to change your treatment, we will call you to review the results.   Testing/Procedures:  Your physician has requested that you have cardiac CT.       Follow-Up: At [facility], you and your health needs are our priority.  As part of our continuing mission to provide you with exceptional heart care, we have created designated Provider Care Teams.  These Care Teams include your primary Cardiologist (physician) and Advanced Practice Providers (APPs -  Physician Assistants and Nurse Practitioners) who all work together to provide you with the care you need, when you need it.   Your next appointment:   1 year(s)  The format for your next appointment:   In Person  Provider:   You may see Dr. Acie Fredrickson or one of the following Advanced Practice Providers on your designated Care Team:    Richardson Dopp, PA-C  Vin [place], [place]    Other Instructions Your cardiac CT will be scheduled at one of the below locations:   [facility] [address] [place], [place] [address] [phone]   If scheduled at [facility], please arrive at the [facility] main entrance (entrance A) of [facility] 30 minutes prior to test start time. Proceed to the [facility] Radiology Department (first floor) to check-in and test prep.  If scheduled at [facility], please arrive 15 mins early for check-in and test  prep.  Please follow these instructions carefully (unless otherwise directed):  Hold all erectile dysfunction medications at least 3 days (72 hrs) prior to test.  On the Night Before the Test: . Be sure to Drink plenty of water. . Do not consume any caffeinated/decaffeinated beverages or chocolate 12 hours prior to your test. . Do not take any antihistamines 12 hours prior to your test.  On the Day of the Test: . Drink plenty of water until 1 hour prior to the test. . Do not eat any food 4 hours prior to the test. . You may take your regular medications prior to the test.  . Take metoprolol (Lopressor) two hours prior to test. . HOLD Furosemide/Hydrochlorothiazide morning of the test.        After the Test: . Drink plenty of water. . After receiving IV contrast, you may experience a mild flushed feeling. This is normal. . On occasion, you may experience a mild rash up to 24 hours after the test. This is not dangerous. If this occurs, you can take Benadryl 25 mg and increase your fluid intake. . If you experience trouble breathing, this can be serious. If it is severe call 911 IMMEDIATELY. If it is mild, please call our office. . If you take any of these medications: Glipizide/Metformin, Avandament, Glucavance, please do not take 48 hours after completing test unless otherwise instructed.   Once we have confirmed authorization from your insurance company, we will call you to set up a  date and time for your test. Based on how quickly your insurance processes prior authorizations requests, please allow up to 4 weeks to be contacted for scheduling your Cardiac CT appointment. Be advised that routine Cardiac CT appointments could be scheduled as many as 8 weeks after your provider has ordered it.  For non-scheduling related questions, please contact the cardiac imaging nurse navigator should you have any questions/concerns: Marchia Bond, Cardiac Imaging Nurse Navigator Gordy Clement,  Cardiac Imaging Nurse Navigator [facility] Direct Office Dial: [phone]   For scheduling needs, including cancellations and rescheduling, please call [place], [phone].

## 2020-09-22 ENCOUNTER — Other Ambulatory Visit: Payer: Self-pay | Admitting: Cardiovascular Disease

## 2020-09-22 DIAGNOSIS — I10 Essential (primary) hypertension: Secondary | ICD-10-CM

## 2020-09-22 NOTE — Telephone Encounter (Signed)
RN called patient to update on medication change. Patient in agreement.

## 2020-09-22 NOTE — Telephone Encounter (Signed)
Pt's pharmacy stating that pt's medication losartan 100 mg tablets are on backorder, with no release date. Pharmacy would like to know if Dr. Acie Fredrickson would prescribe an alternative? Please address

## 2020-09-23 ENCOUNTER — Other Ambulatory Visit: Payer: Self-pay

## 2020-09-23 ENCOUNTER — Telehealth: Payer: Self-pay

## 2020-09-23 DIAGNOSIS — Z01812 Encounter for preprocedural laboratory examination: Secondary | ICD-10-CM

## 2020-09-23 NOTE — Telephone Encounter (Signed)
Call to patient to schedule a time to come in and complete labs prior to cardiac CT. Patient in agreement to come on 3/16 for BMET.

## 2020-09-29 ENCOUNTER — Other Ambulatory Visit: Payer: Self-pay

## 2020-09-29 ENCOUNTER — Telehealth (HOSPITAL_COMMUNITY): Payer: Self-pay | Admitting: *Deleted

## 2020-09-29 ENCOUNTER — Other Ambulatory Visit: Payer: Medicare HMO | Admitting: *Deleted

## 2020-09-29 DIAGNOSIS — Z01812 Encounter for preprocedural laboratory examination: Secondary | ICD-10-CM

## 2020-09-29 LAB — BASIC METABOLIC PANEL
BUN/Creatinine Ratio: 36 — ABNORMAL HIGH (ref 10–24)
BUN: 27 mg/dL (ref 8–27)
CO2: 26 mmol/L (ref 20–29)
Calcium: 9.5 mg/dL (ref 8.6–10.2)
Chloride: 106 mmol/L (ref 96–106)
Creatinine, Ser: 0.74 mg/dL — ABNORMAL LOW (ref 0.76–1.27)
Glucose: 111 mg/dL — ABNORMAL HIGH (ref 65–99)
Potassium: 5.1 mmol/L (ref 3.5–5.2)
Sodium: 141 mmol/L (ref 134–144)
eGFR: 95 mL/min/{1.73_m2} (ref >59)

## 2020-09-29 IMAGING — DX DG CHEST 2V
2 series · 2 of 2 positions shown · non-contrast
Comparison: Radiographs October 21, 2012.

CLINICAL DATA: Cough.

EXAM:
CHEST - 2 VIEW

[chest pa]
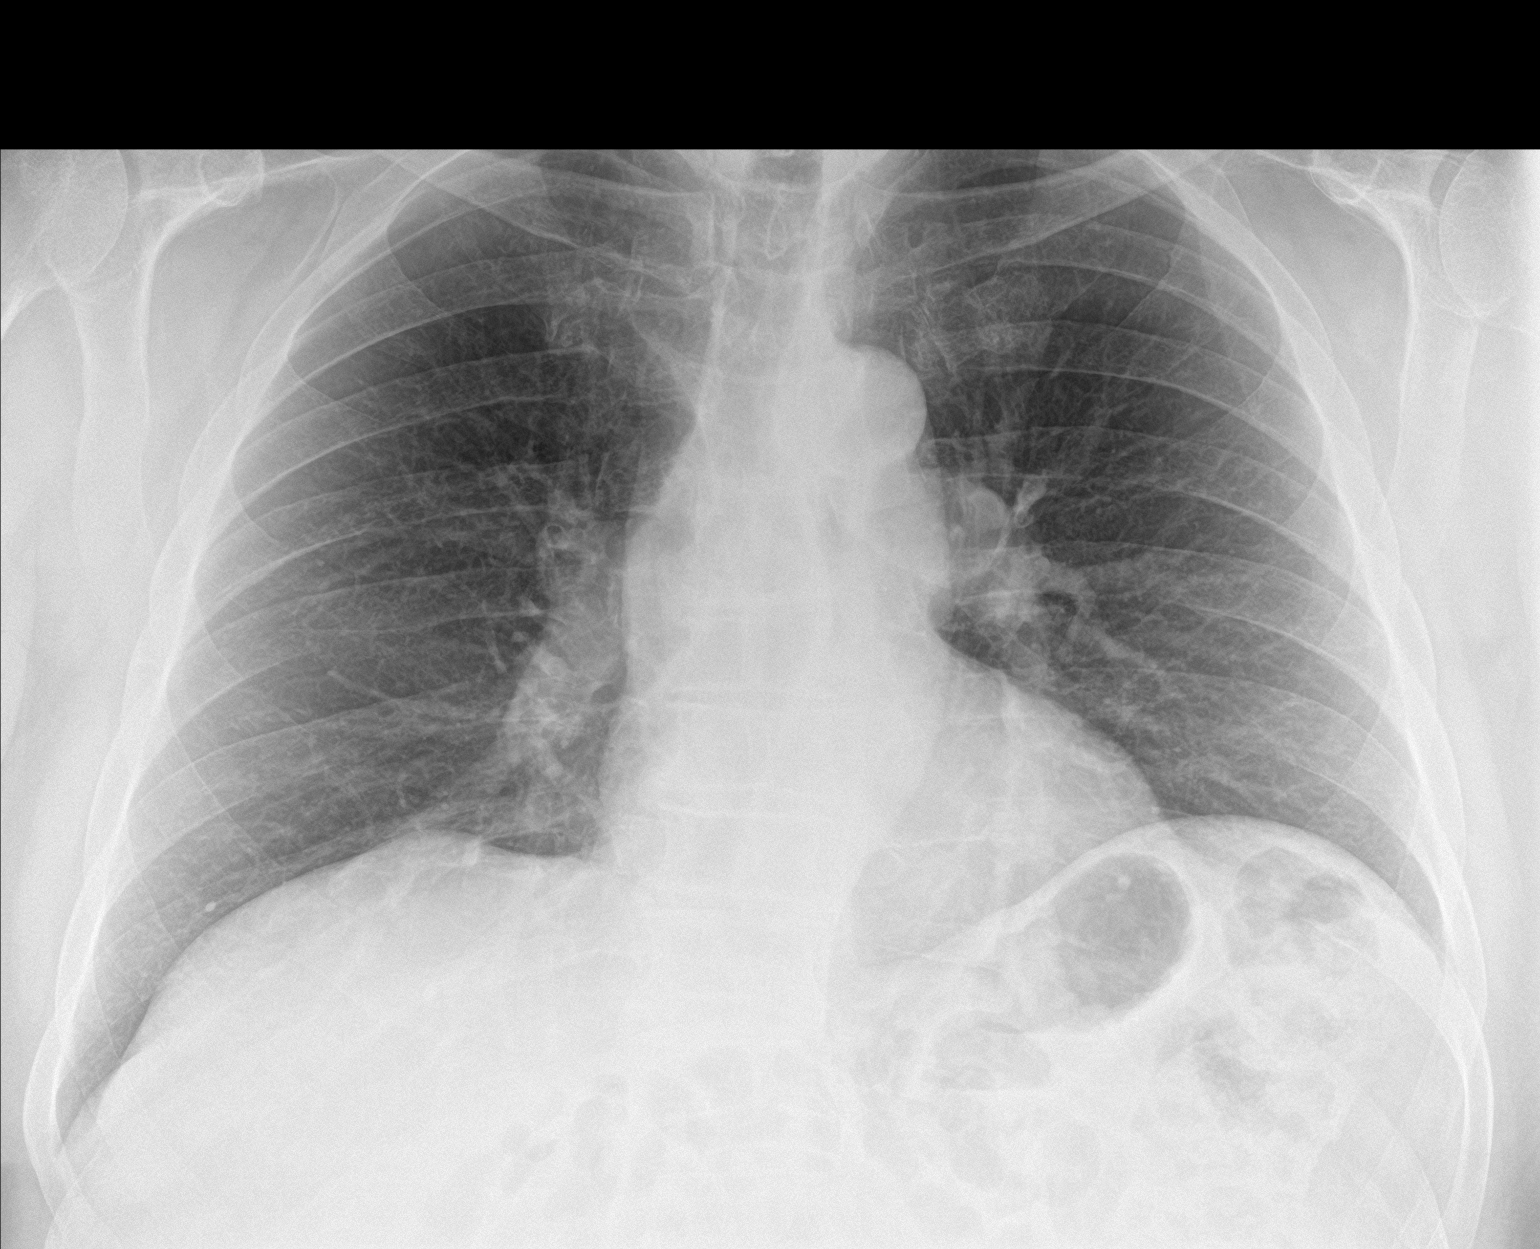

[chest lat]
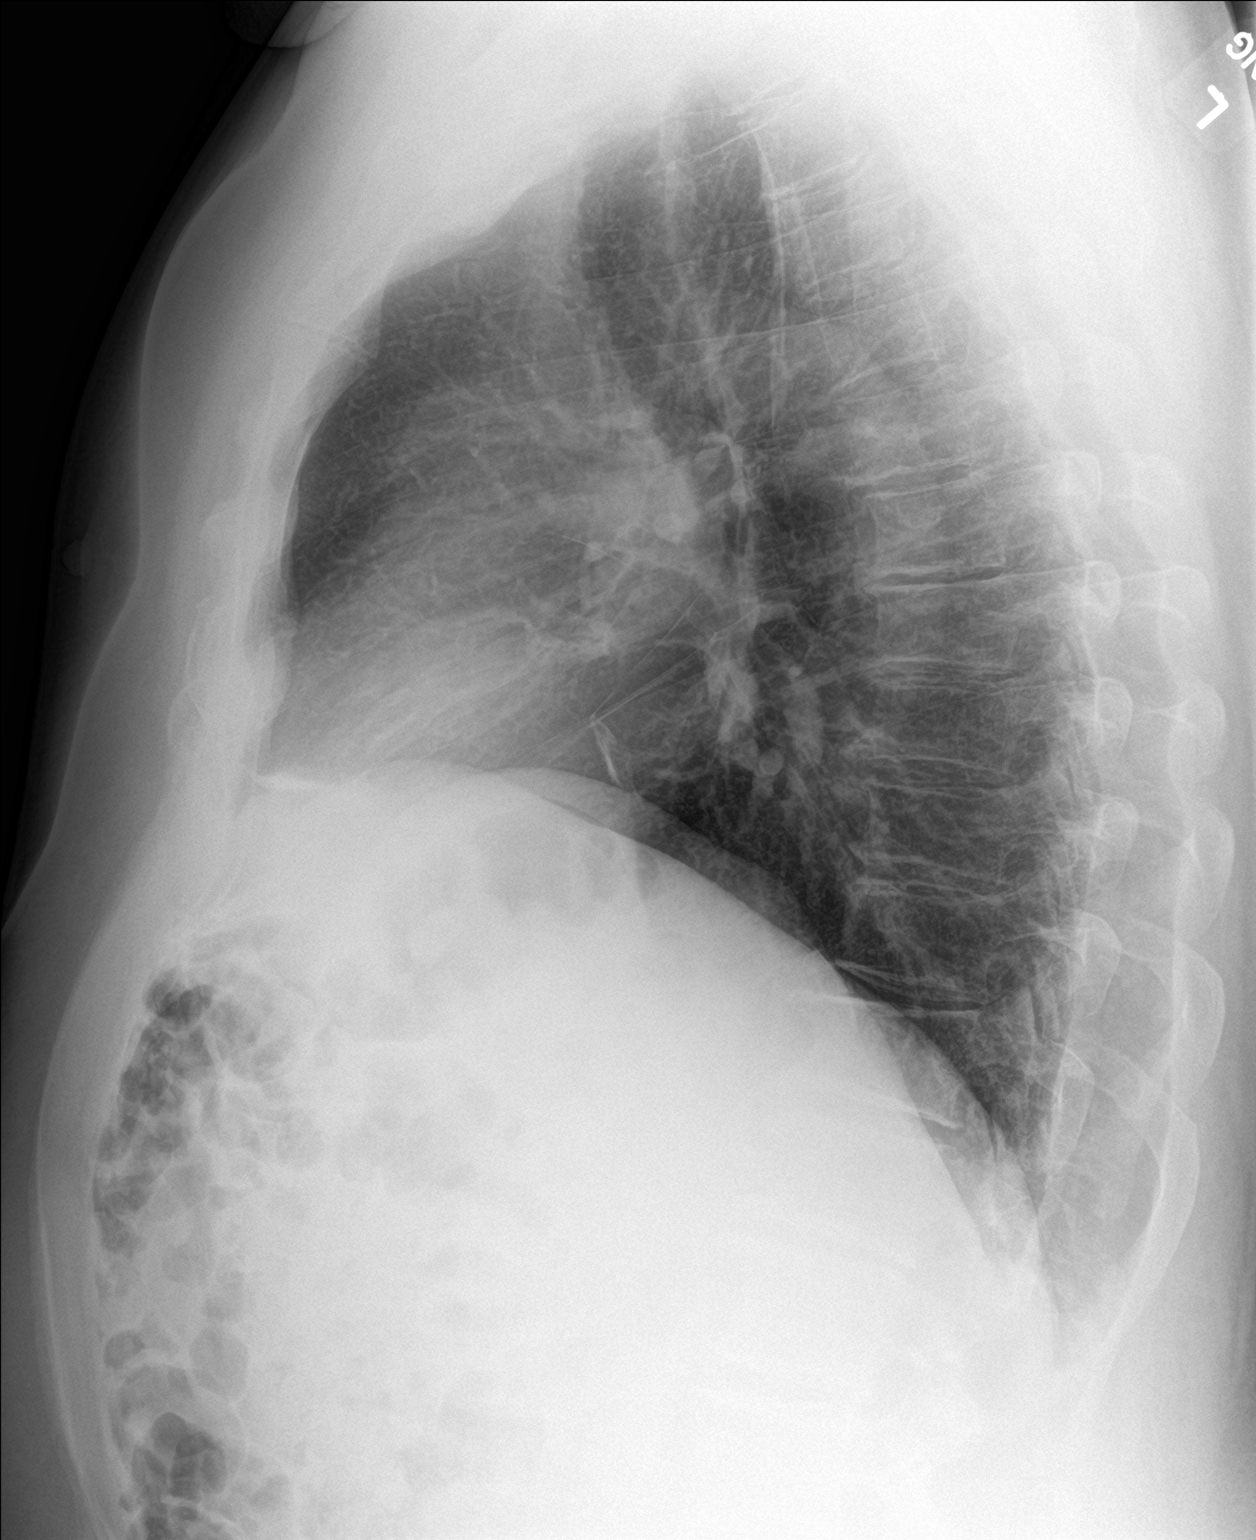

[2 of 2 positions shown; findings below may reference images not displayed]

FINDINGS: The heart size and mediastinal contours are within normal limits.
Both lungs are clear. The visualized skeletal structures are
unremarkable.
IMPRESSION: No active cardiopulmonary disease.

## 2020-09-29 NOTE — Telephone Encounter (Signed)
Attempted to call patient regarding upcoming cardiac CT appointment. °Left message on voicemail with name and callback number ° °Romir Klimowicz RN Navigator Cardiac Imaging °[facility] °[phone] Office °[phone] Cell ° °

## 2020-09-29 NOTE — Telephone Encounter (Signed)
Pt returning call regarding upcoming cardiac imaging study; pt verbalizes understanding of appt date/time, parking situation and where to check in, pre-test NPO status and medications ordered, and verified current allergies; name and call back number provided for further questions should they arise  Skyelar Swigart RN Navigator Cardiac Imaging [facility] [phone] office [phone] cell  

## 2020-09-30 ENCOUNTER — Encounter (HOSPITAL_COMMUNITY): Payer: Self-pay | Admitting: Cardiovascular Disease

## 2020-09-30 ENCOUNTER — Encounter (HOSPITAL_COMMUNITY): Payer: Self-pay

## 2020-09-30 ENCOUNTER — Ambulatory Visit (HOSPITAL_COMMUNITY)
Admission: RE | Admit: 2020-09-30 | Discharge: 2020-09-30 | Disposition: A | Discharge status: Home / Self Care | Payer: Medicare HMO | Source: Ambulatory Visit | Attending: Cardiovascular Disease | Admitting: Cardiovascular Disease

## 2020-09-30 ENCOUNTER — Telehealth: Payer: Self-pay

## 2020-09-30 DIAGNOSIS — I10 Essential (primary) hypertension: Secondary | ICD-10-CM

## 2020-09-30 DIAGNOSIS — R072 Precordial pain: Secondary | ICD-10-CM

## 2020-09-30 DIAGNOSIS — R0789 Other chest pain: Secondary | ICD-10-CM

## 2020-09-30 MED ORDER — DILTIAZEM HCL 25 MG/5ML IV SOLN
INTRAVENOUS | Status: AC
  Filled 2020-09-30: qty 10

## 2020-09-30 MED ORDER — METOPROLOL TARTRATE 5 MG/5ML IV SOLN
INTRAVENOUS | Status: AC
  Filled 2020-09-30: qty 5

## 2020-09-30 MED ORDER — METOPROLOL TARTRATE 5 MG/5ML IV SOLN
5.0000 mg | INTRAVENOUS | Status: DC | PRN
  Administered 2020-09-30 (×3): 5 mg via INTRAVENOUS

## 2020-09-30 MED ORDER — DILTIAZEM HCL 25 MG/5ML IV SOLN
5.0000 mg | Freq: Once | INTRAVENOUS | Status: AC
  Administered 2020-09-30: 5 mg via INTRAVENOUS
  Filled 2020-09-30: qty 5

## 2020-09-30 MED ORDER — NITROGLYCERIN 0.4 MG SL SUBL
SUBLINGUAL_TABLET | SUBLINGUAL | Status: AC
  Filled 2020-09-30: qty 2

## 2020-09-30 MED ORDER — NITROGLYCERIN 0.4 MG SL SUBL: 0.8000 mg | SUBLINGUAL_TABLET | Freq: Once | SUBLINGUAL | Status: DC

## 2020-09-30 NOTE — Progress Notes (Signed)
Patient scheduled for CT coronary heart scan.  Patient unable to complete heart scan due to anxiety and HR in high 100's while on CT scanner. Coronary CTA protocol followed (seeMAR). Cardiologist Marlou Porch, MD notified and instructed for patient to be rescheduled. Clarise Cruz, nurse navigator notified. Patient to be rescheduled. Patient verbalized understanding. Patient escorted to front of hospital by RN.

## 2020-09-30 NOTE — Telephone Encounter (Signed)
-----   Message from Thayer Headings, MD sent at 09/30/2020 12:57 PM EDT ----- Regarding: FW: Erratic heart rate for CT Lets get a Lexiscan myoview in him please  Thanks  Phil  ----- Message ----- From: Jerline Pain, MD Sent: 09/30/2020  12:55 PM EDT To: Thayer Headings, MD Subject: Erratic heart rate for CT                      Phil,  Cardiac nurse tried several doses of metoprolol IV as well as IV diltiazem however once patient was in the scanner his heart rate fluctuate wildly from the 50s up to low 100s.  Nurse wonders of anxiety was playing a role.  Because of this dramatic heart rate variation, we decided to cancel the scan.  Would suggest another modality to assess his chest pain.  Thanks  [employer]

## 2020-09-30 NOTE — Telephone Encounter (Signed)
Placed order for myoview. Went over instructions for test and will send them through Mychart in a letter. Will send encounter to Dr. Acie Fredrickson to sign consent.

## 2020-10-01 ENCOUNTER — Other Ambulatory Visit: Payer: Self-pay

## 2020-10-01 ENCOUNTER — Ambulatory Visit (INDEPENDENT_AMBULATORY_CARE_PROVIDER_SITE_OTHER): Payer: Medicare HMO | Admitting: Family

## 2020-10-01 ENCOUNTER — Encounter: Payer: Self-pay | Admitting: Family

## 2020-10-01 VITALS — BP 128/72 | HR 70 | Temp 98.0°F | Ht 73.0 in | Wt 220.2 lb

## 2020-10-01 DIAGNOSIS — H409 Unspecified glaucoma: Secondary | ICD-10-CM

## 2020-10-01 DIAGNOSIS — E782 Mixed hyperlipidemia: Secondary | ICD-10-CM

## 2020-10-01 DIAGNOSIS — R7303 Prediabetes: Secondary | ICD-10-CM

## 2020-10-01 DIAGNOSIS — Z0000 Encounter for general adult medical examination without abnormal findings: Secondary | ICD-10-CM

## 2020-10-01 DIAGNOSIS — C61 Malignant neoplasm of prostate: Secondary | ICD-10-CM

## 2020-10-01 DIAGNOSIS — I10 Essential (primary) hypertension: Secondary | ICD-10-CM | POA: Diagnosis not present

## 2020-10-01 DIAGNOSIS — Z23 Encounter for immunization: Secondary | ICD-10-CM | POA: Diagnosis not present

## 2020-10-01 LAB — CBC WITH DIFFERENTIAL/PLATELET
Basophils Absolute: 0 10*3/uL (ref 0.0–0.1)
Basophils Relative: 0.6 % (ref 0.0–3.0)
Eosinophils Absolute: 0.1 10*3/uL (ref 0.0–0.7)
Eosinophils Relative: 1.6 % (ref 0.0–5.0)
HCT: 49.3 % (ref 39.0–52.0)
Hemoglobin: 17 g/dL (ref 13.0–17.0)
Lymphocytes Relative: 21.9 % (ref 12.0–46.0)
Lymphs Abs: 1.4 10*3/uL (ref 0.7–4.0)
MCHC: 34.4 g/dL (ref 30.0–36.0)
MCV: 89.2 fl (ref 78.0–100.0)
Monocytes Absolute: 0.7 10*3/uL (ref 0.1–1.0)
Monocytes Relative: 10.2 % (ref 3.0–12.0)
Neutro Abs: 4.3 10*3/uL (ref 1.4–7.7)
Neutrophils Relative %: 65.7 % (ref 43.0–77.0)
Platelets: 181 10*3/uL (ref 150.0–400.0)
RBC: 5.52 Mil/uL (ref 4.22–5.81)
RDW: 13.2 % (ref 11.5–15.5)
WBC: 6.5 10*3/uL (ref 4.0–10.5)

## 2020-10-01 LAB — COMPREHENSIVE METABOLIC PANEL
ALT: 17 U/L (ref 0–53)
AST: 16 U/L (ref 0–37)
Albumin: 4.3 g/dL (ref 3.5–5.2)
Alkaline Phosphatase: 89 U/L (ref 39–117)
BUN: 20 mg/dL (ref 6–23)
CO2: 26 mEq/L (ref 19–32)
Calcium: 9.4 mg/dL (ref 8.4–10.5)
Chloride: 106 mEq/L (ref 96–112)
Creatinine, Ser: 0.76 mg/dL (ref 0.40–1.50)
GFR: 88.39 mL/min (ref >60.00)
Glucose, Bld: 104 mg/dL — ABNORMAL HIGH (ref 70–99)
Potassium: 4.1 mEq/L (ref 3.5–5.1)
Sodium: 139 mEq/L (ref 135–145)
Total Bilirubin: 1.3 mg/dL — ABNORMAL HIGH (ref 0.2–1.2)
Total Protein: 7.1 g/dL (ref 6.0–8.3)

## 2020-10-01 LAB — LIPID PANEL
Cholesterol: 128 mg/dL (ref 0–200)
HDL: 39.9 mg/dL (ref >39.00)
LDL Cholesterol: 68 mg/dL (ref 0–99)
NonHDL: 87.81
Total CHOL/HDL Ratio: 3
Triglycerides: 100 mg/dL (ref 0.0–149.0)
VLDL: 20 mg/dL (ref 0.0–40.0)

## 2020-10-01 LAB — HEMOGLOBIN A1C: Hgb A1c MFr Bld: 5.2 % (ref 4.6–6.5)

## 2020-10-01 MED ORDER — SERTRALINE HCL 50 MG PO TABS: ORAL_TABLET | ORAL | 1 refills | Status: DC

## 2020-10-01 NOTE — Progress Notes (Signed)
[name] is a 75 y.o. male with the following history as recorded in EpicCare:  Patient Active Problem List   Diagnosis Date Noted  . Chest tightness 09/17/2020  . Mitral regurgitation 09/10/2019  . Encounter for general adult medical examination with abnormal findings 01/06/2016  . Prostate cancer ([place]) 10/16/2012  . DEGENERATIVE JOINT DISEASE, BOTH KNEES, SEVERE 02/17/2010  . Essential hypertension 01/07/2009  . Diverticulosis of large intestine 01/07/2009  . Hyperglycemia 01/07/2009  . Glaucoma 02/25/2008  . ARTHRITIS 02/25/2008  . HYPERLIPIDEMIA 07/26/2007  . Esophagitis 07/26/2007    Current Outpatient Medications  Medication Sig Dispense Refill  . aspirin 81 MG tablet Take 81 mg by mouth daily.    . dorzolamide-timolol (COSOPT) 22.3-6.8 MG/ML ophthalmic solution 1 drop 2 (two) times daily.    . simvastatin (ZOCOR) 20 MG tablet TAKE 1 TABLET BY MOUTH EVERYDAY AT BEDTIME 90 tablet 3  . telmisartan (MICARDIS) 80 MG tablet Take 1 tablet (80 mg total) by mouth daily. 90 tablet 3   No current facility-administered medications for this visit.   Facility-Administered Medications Ordered in Other Visits  Medication Dose Route Frequency Provider Last Rate Last Admin  . metoprolol tartrate (LOPRESSOR) injection 5 mg  5 mg Intravenous Q5 min PRN Jerline Pain, MD   5 mg at 09/30/20 1240    Allergies: Penicillins, Lisinopril, and Other  Past Medical History:  Diagnosis Date  . Allergy   . Arthritis   . ARTHRITIS 02/25/2008   Qualifier: Diagnosis of  By: Marland Mcalpine    . DEGENERATIVE JOINT DISEASE, BOTH KNEES, SEVERE 02/17/2010   Qualifier: Diagnosis of  By: Elna Breslow    . Diverticulosis of large intestine 01/07/2009   Qualifier: Diagnosis of  By: Linna Darner MD, Gwyndolyn Saxon   Colonoscopy 2009;Dr  Kaplan,[facility]    . Erectile dysfunction   . Esophagitis 07/26/2007   Qualifier: Diagnosis of  By: Linna Darner MD, Gwyndolyn Saxon    . Essential hypertension 01/07/2009   Qualifier:  Diagnosis of  By: Linna Darner MD, Gwyndolyn Saxon    . Glaucoma    OD only; from remote trauma (open angle)  . GLAUCOMA, PRIMARY OPEN-ANGLE 02/25/2008   Qualifier: Diagnosis of  By: Marland Mcalpine   OD only;Dr Dionne Ano, [place]    . Heart murmur   . Hyperglycemia 01/07/2009   Qualifier: Diagnosis of  By: Linna Darner MD, Gwyndolyn Saxon   No FH DM   . Hyperlipidemia   . HYPERLIPIDEMIA 07/26/2007   Qualifier: Diagnosis of  By: Linna Darner MD, Cristopher Estimable Lipoprofile 2010: LDL 93 (1800/ 1237), HDL 38, TG 153.  Minimal LDL goal = < 100, preferred goal = < 70. Mother MI @ 1. Father CVA @ 89    . Hypertension   . Prostate cancer ([place]) 10/16/12   Adenocarcinoma    Past Surgical History:  Procedure Laterality Date  . CATARACT EXTRACTION Left 06/20/2018  . COLONOSCOPY  2009   Dr Deatra Ina; due 2019  . LYMPHADENECTOMY Bilateral 02/27/2013   Procedure: LYMPHADENECTOMY;  Surgeon: Dutch Gray, MD;  [facility];  Service: Urology;  Laterality: Bilateral;  . ROBOT ASSISTED LAPAROSCOPIC RADICAL PROSTATECTOMY N/A 02/27/2013   Procedure: ROBOTIC ASSISTED LAPAROSCOPIC RADICAL PROSTATECTOMY LEVEL 2;  Surgeon: Dutch Gray, MD;  [facility];  Service: Urology;  Laterality: N/A;  . TONSILLECTOMY     age 87 Dr.Yut  . TOTAL KNEE ARTHROPLASTY  07/2010   Dr Maureen Ralphs  . undescended testicle     age 81    Family  History  Problem Relation Age of Onset  . Heart attack Mother 77  . Stroke Father 65  . Osteoarthritis Brother   . Cancer Neg Hx   . Diabetes Neg Hx     Social History   Tobacco Use  . Smoking status: Never Smoker  . Smokeless tobacco: Never Used  Substance Use Topics  . Alcohol use: No    Subjective:   Patient presents for yearly CPE; admits has been very stressful past few months- son-in-law recently diagnosed with CML; Working with cardiology for hypertension; seeing urology and ophthalmologist yearly;   Review of Systems  Constitutional: Negative.   HENT: Negative.   Eyes: Negative.   Respiratory:  Negative.   Cardiovascular: Negative.   Gastrointestinal: Negative.   Genitourinary: Negative.   Musculoskeletal: Negative.   Skin: Negative.   Neurological: Negative.   Endo/Heme/Allergies: Negative.   Psychiatric/Behavioral: Negative.      Objective:  Vitals:   10/01/20 0957  BP: 128/72  Pulse: 70  Temp: 98 F (36.7 C)  TempSrc: Oral  SpO2: 97%  Weight: 220 lb 3.2 oz (99.9 kg)  Height: 6\' 1"  (1.854 m)    General: Well developed, well nourished, in no acute distress  Skin : Warm and dry.  Head: Normocephalic and atraumatic  Eyes: Sclera and conjunctiva clear; pupils round and reactive to light; extraocular movements intact  Ears: External normal; canals clear; tympanic membranes normal  Oropharynx: Pink, supple. No suspicious lesions  Neck: Supple without thyromegaly, adenopathy  Lungs: Respirations unlabored; clear to auscultation bilaterally without wheeze, rales, rhonchi  CVS exam: normal rate and regular rhythm.  Abdomen: Soft; nontender; nondistended; normoactive bowel sounds; no masses or hepatosplenomegaly  Musculoskeletal: No deformities; no active joint inflammation  Extremities: No edema, cyanosis, clubbing  Vessels: Symmetric bilaterally  Neurologic: Alert and oriented; speech intact; face symmetrical; moves all extremities well; CNII-XII intact without focal deficit   Assessment:  1. PE (physical exam), annual   2. Essential hypertension   3. Prostate cancer ([place]) Chronic  4. Glaucoma, unspecified glaucoma type, unspecified laterality   5. Pre-diabetes     Plan:  Age appropriate preventive healthcare needs addressed; encouraged regular eye doctor and dental exams; encouraged regular exercise; will update labs and refills as needed today; follow-up to be determined; Trial of Zoloft 50 mg daily due to situational stress/ anxiety- patient will touch base by Mychart in 3-4 weeks with response/ risks and benefits discussed; Tdap updated;  This visit occurred  during the SARS-CoV-2 public health emergency.  Safety protocols were in place, including screening questions prior to the visit, additional usage of staff PPE, and extensive cleaning of exam room while observing appropriate contact time as indicated for disinfecting solutions.     No follow-ups on file.  No orders of the defined types were placed in this encounter.   Requested Prescriptions    No prescriptions requested or ordered in this encounter

## 2020-10-07 ENCOUNTER — Telehealth: Payer: Self-pay

## 2020-10-07 NOTE — Telephone Encounter (Signed)
Spoke with the patient, detailed instructions given. He stated that he would be here for his test. Asked to call back with any questions. S.Lisle Skillman EMTP 

## 2020-10-08 ENCOUNTER — Ambulatory Visit (HOSPITAL_COMMUNITY): Payer: Medicare HMO

## 2020-10-12 ENCOUNTER — Encounter (HOSPITAL_COMMUNITY): Payer: Medicare HMO

## 2020-10-20 ENCOUNTER — Telehealth (HOSPITAL_COMMUNITY): Payer: Self-pay | Admitting: *Deleted

## 2020-10-20 NOTE — Telephone Encounter (Signed)
Patient given detailed instructions per Myocardial Perfusion Study Information Sheet for the test on 10/27/20. Patient notified to arrive 15 minutes early and that it is imperative to arrive on time for appointment to keep from having the test rescheduled.  If you need to cancel or reschedule your appointment, please call the office within 24 hours of your appointment. . Patient verbalized understanding. Kirstie Peri

## 2020-10-27 ENCOUNTER — Ambulatory Visit (HOSPITAL_COMMUNITY): Payer: Medicare HMO | Attending: Internal Medicine

## 2020-10-27 ENCOUNTER — Other Ambulatory Visit: Payer: Self-pay

## 2020-10-27 DIAGNOSIS — I10 Essential (primary) hypertension: Secondary | ICD-10-CM | POA: Insufficient documentation

## 2020-10-27 DIAGNOSIS — R0789 Other chest pain: Secondary | ICD-10-CM | POA: Insufficient documentation

## 2020-10-27 DIAGNOSIS — R072 Precordial pain: Secondary | ICD-10-CM | POA: Insufficient documentation

## 2020-10-27 LAB — MYOCARDIAL PERFUSION IMAGING
LV dias vol: 87 mL (ref 62–150)
LV sys vol: 31 mL
Peak HR: 91 {beats}/min
Rest HR: 67 {beats}/min
SDS: 1
SRS: 0
SSS: 1
TID: 0.9

## 2020-10-27 MED ORDER — TECHNETIUM TC 99M TETROFOSMIN IV KIT
29.7000 | PACK | Freq: Once | INTRAVENOUS | Status: DC | PRN
  Filled 2020-10-27: qty 30

## 2020-10-27 MED ORDER — TECHNETIUM TC 99M TETROFOSMIN IV KIT
10.8000 | PACK | Freq: Once | INTRAVENOUS | Status: DC | PRN
  Filled 2020-10-27: qty 11

## 2020-10-27 MED ORDER — REGADENOSON 0.4 MG/5ML IV SOLN
0.4000 mg | Freq: Once | INTRAVENOUS | Status: DC
Start: 2020-10-27 — End: 2023-01-04

## 2020-11-24 ENCOUNTER — Other Ambulatory Visit: Payer: Self-pay | Admitting: Family

## 2020-11-24 ENCOUNTER — Encounter: Payer: Self-pay | Admitting: Family

## 2020-12-17 DIAGNOSIS — H4031X2 Glaucoma secondary to eye trauma, right eye, moderate stage: Secondary | ICD-10-CM | POA: Diagnosis not present

## 2021-03-09 DIAGNOSIS — H4031X2 Glaucoma secondary to eye trauma, right eye, moderate stage: Secondary | ICD-10-CM | POA: Diagnosis not present

## 2021-03-16 ENCOUNTER — Other Ambulatory Visit: Payer: Self-pay | Admitting: Family

## 2021-03-16 DIAGNOSIS — E782 Mixed hyperlipidemia: Secondary | ICD-10-CM

## 2021-04-29 DIAGNOSIS — C61 Malignant neoplasm of prostate: Secondary | ICD-10-CM | POA: Diagnosis not present

## 2021-05-04 ENCOUNTER — Ambulatory Visit (INDEPENDENT_AMBULATORY_CARE_PROVIDER_SITE_OTHER): Payer: Medicare HMO

## 2021-05-04 ENCOUNTER — Other Ambulatory Visit: Payer: Self-pay

## 2021-05-04 DIAGNOSIS — Z23 Encounter for immunization: Secondary | ICD-10-CM | POA: Diagnosis not present

## 2021-05-06 DIAGNOSIS — C61 Malignant neoplasm of prostate: Secondary | ICD-10-CM | POA: Diagnosis not present

## 2021-05-06 DIAGNOSIS — N3946 Mixed incontinence: Secondary | ICD-10-CM | POA: Diagnosis not present

## 2021-07-01 ENCOUNTER — Encounter: Payer: Self-pay | Admitting: Family

## 2021-09-07 ENCOUNTER — Other Ambulatory Visit: Payer: Self-pay | Admitting: Cardiovascular Disease

## 2021-09-07 DIAGNOSIS — I10 Essential (primary) hypertension: Secondary | ICD-10-CM

## 2021-09-12 ENCOUNTER — Encounter: Payer: Self-pay | Admitting: Cardiovascular Disease

## 2021-09-12 ENCOUNTER — Other Ambulatory Visit: Payer: Self-pay

## 2021-09-12 ENCOUNTER — Ambulatory Visit: Payer: Medicare HMO | Admitting: Cardiovascular Disease

## 2021-09-12 VITALS — BP 138/76 | HR 68 | Ht 73.0 in | Wt 231.8 lb

## 2021-09-12 DIAGNOSIS — I10 Essential (primary) hypertension: Secondary | ICD-10-CM | POA: Diagnosis not present

## 2021-09-12 DIAGNOSIS — I350 Nonrheumatic aortic (valve) stenosis: Secondary | ICD-10-CM

## 2021-09-12 MED ORDER — TELMISARTAN 80 MG PO TABS: 80.0000 mg | ORAL_TABLET | Freq: Every day | ORAL | 3 refills | Status: DC

## 2021-09-12 NOTE — Progress Notes (Signed)
Cardiology Office Note:    Date:  27-Sep-2021   ID:  [name], DOB 1945-12-19, MRN [id number]  PCP:  Jack Salvage, FNP  Cardiologist:  [name]  Electrophysiologist:  None   Referring MD: [name],*   Chief Complaint  Patient presents with   Hypertension         Previous notes:    [name] is a 76 y.o. male with a hx of heart murmur.  We were asked to see him today by Jack Mourning, NP for further evaluation of his murmur Hx of HTN HLD Left TKA Prostate Cancer  feels great .   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   [place], [place] regularly 4-5 times a week 30 min - 1 hour Used to weigh 270 - down 50 lbs over the past year Eats a low fat, low carb diet  Mother had CAD - had MI   September 17, 2020: [name] is seen today for follow up of his hypertension and heart murmur. He lost quite a bit of weight last year. Has had some tightness with walking up hills  Seems to be occurring with some regularity  + family hx of CAD , mother died of CAD in 31.   2021-09-27 [name] is seen for follow up of his HTN and chest tightness Lexiscan myoview showed no ischemia.  Normal LV function  Wt is 231 lbs.   Exercises regularly 4-5 times a week , 1-1.5 miles a week .     Past Medical History:  Diagnosis Date   Allergy    Arthritis    ARTHRITIS 02/25/2008   Qualifier: Diagnosis of  By: [name]     DEGENERATIVE JOINT DISEASE, BOTH KNEES, SEVERE 02/17/2010   Qualifier: Diagnosis of  By: [name]     Diverticulosis of large intestine 01/07/2009   Qualifier: Diagnosis of  By: Jack Darner MD, Jack   Colonoscopy 2009;Jack  [name],[facility]     Erectile dysfunction    Esophagitis 07/26/2007   Qualifier: Diagnosis of  By: Jack Darner MD, [place] hypertension 01/07/2009   Qualifier: Diagnosis of  By: Jack Darner MD, [name]    OD only; from  remote trauma (open angle)   GLAUCOMA, PRIMARY OPEN-ANGLE 02/25/2008   Qualifier: Diagnosis of  By: [name]   OD only;Jack [name], [place]     Heart murmur    Hyperglycemia 01/07/2009   Qualifier: Diagnosis of  By: Jack Darner MD, [name]   No FH DM    Hyperlipidemia    HYPERLIPIDEMIA 07/26/2007   Qualifier: Diagnosis of  By: Jack Darner MD, [name] Lipoprofile 2010: LDL 93 (1800/ 1237), HDL 38, TG 153.  Minimal LDL goal = < 100, preferred goal = < 70. Mother MI @ 102. Father CVA @ 29     Hypertension    Prostate cancer ([place]) 10/16/12   Adenocarcinoma    Past Surgical History:  Procedure Laterality Date   CATARACT EXTRACTION Left 06/20/2018   COLONOSCOPY  2009   [name]; due 2019   LYMPHADENECTOMY Bilateral 02/27/2013   Procedure: LYMPHADENECTOMY;  Surgeon: Jack Gray, MD;  [facility];  Service: Urology;  Laterality: Bilateral;   ROBOT ASSISTED LAPAROSCOPIC RADICAL PROSTATECTOMY N/A 02/27/2013   Procedure: ROBOTIC ASSISTED LAPAROSCOPIC RADICAL PROSTATECTOMY LEVEL 2;  Surgeon:  Jack Gray, MD;  [facility];  Service: Urology;  Laterality: N/A;   TONSILLECTOMY     age 82 [name]   TOTAL KNEE ARTHROPLASTY  07/2010   Jack [name]   undescended testicle     age 75    Current Medications: Current Meds  Medication Sig   aspirin 81 MG tablet Take 81 mg by mouth daily.   dorzolamide-timolol (COSOPT) 22.3-6.8 MG/ML ophthalmic solution 1 drop 2 (two) times daily.   simvastatin (ZOCOR) 20 MG tablet TAKE 1 TABLET BY MOUTH EVERYDAY AT BEDTIME   [DISCONTINUED] telmisartan (MICARDIS) 80 MG tablet Take 1 tablet (80 mg total) by mouth daily. Please keep upcoming appointment in February 2023 for future refills. Thank you     Allergies:   Penicillins, Lisinopril, and Other   Social History   Socioeconomic History   Marital status: Married    Spouse name: Not on file   Number of children: 1   Years of education: 18   Highest education level: Not on file  Occupational History    Occupation: AUDIOLOGIST    Employer: [employer]  Tobacco Use   Smoking status: Never   Smokeless tobacco: Never  Vaping Use   Vaping Use: Never used  Substance and Sexual Activity   Alcohol use: No   Drug use: No   Sexual activity: Not Currently  Other Topics Concern   Not on file  Social History Narrative   Married audiologist   Plans to retire soon.   Social Determinants of Health   Financial Resource Strain: Not on file  Food Insecurity: Not on file  Transportation Needs: Not on file  Physical Activity: Not on file  Stress: Not on file  Social Connections: Not on file     Family History: The patient's family history includes Heart attack (age of onset: 84) in his mother; Osteoarthritis in his brother; Stroke (age of onset: 49) in his father. There is no history of Cancer or Diabetes.  ROS:   Please see the history of present illness.     All other systems reviewed and are negative.  EKGs/Labs/Other Studies Reviewed:    The following studies were reviewed today:    Recent Labs: 10/01/2020: ALT 17; BUN 20; Creatinine, Ser 0.76; Hemoglobin 17.0; Platelets 181.0; Potassium 4.1; Sodium 139  Recent Lipid Panel    Component Value Date/Time   CHOL 128 10/01/2020 1042   TRIG 100.0 10/01/2020 1042   HDL 39.90 10/01/2020 1042   CHOLHDL 3 10/01/2020 1042   VLDL 20.0 10/01/2020 1042   LDLCALC 68 10/01/2020 1042    Physical Exam:      Physical Exam: Blood pressure 138/76, pulse 68, height 6\' 1"  (1.854 m), weight 231 lb 12.8 oz (105.1 kg), SpO2 98 %.  GEN:  Well nourished, well developed in no acute distress HEENT: Normal NECK: No JVD; No carotid bruits LYMPHATICS: No lymphadenopathy CARDIAC: RRR  5-4/2 systolic RESPIRATORY:  Clear to auscultation without rales, wheezing or rhonchi  ABDOMEN: Soft, non-tender, non-distended MUSCULOSKELETAL:  No edema; No deformity  SKIN: Warm and dry NEUROLOGIC:  Alert and oriented x 3     EKG:      ASSESSMENT:     1. Aortic valve stenosis, etiology of cardiac valve disease unspecified   2. Essential hypertension     PLAN:     1.  HTN:    BP is well controlled.   2.  Aortic stenosis :   he has moderate AS by echo from March, 2021.  plan echo for next year with an office visit to follow about a week later.  He is not having any symptoms of AS.   Ive asked him to call if he has any worsening symptoms of DOE or CP .    3.  Hyperlipidemia:  managed by his primary    4.  Chest tightness:      Medication Adjustments/Labs and Tests Ordered: Current medicines are reviewed at length with the patient today.  Concerns regarding medicines are outlined above.  Orders Placed This Encounter  Procedures   EKG 12-Lead   ECHOCARDIOGRAM COMPLETE   Meds ordered this encounter  Medications   telmisartan (MICARDIS) 80 MG tablet    Sig: Take 1 tablet (80 mg total) by mouth daily.    Dispense:  90 tablet    Refill:  3     Patient Instructions  Medication Instructions:  The current medical regimen is effective;  continue present plan and medications.  *If you need a refill on your cardiac medications before your next appointment, please call your pharmacy*  Testing/Procedures: Your physician has requested that you have an echocardiogram in 1 year (before seeing him back). Echocardiography is a painless test that uses sound waves to create images of your heart. It provides your doctor with information about the size and shape of your heart and how well your hearts chambers and valves are working. This procedure takes approximately one hour. There are no restrictions for this procedure.  Follow-Up: At [facility], you and your health needs are our priority.  As part of our continuing mission to provide you with exceptional heart care, we have created designated Provider Care Teams.  These Care Teams include your primary Cardiologist (physician) and Advanced Practice Providers (APPs -  Physician  Assistants and Nurse Practitioners) who all work together to provide you with the care you need, when you need it.  We recommend signing up for the patient portal called "MyChart".  Sign up information is provided on this After Visit Summary.  MyChart is used to connect with patients for Virtual Visits (Telemedicine).  Patients are able to view lab/test results, encounter notes, upcoming appointments, etc.  Non-urgent messages can be sent to your provider as well.   To learn more about what you can do with MyChart, go to [email or web address].    Your next appointment:   1 year(s)  The format for your next appointment:   In Person  Provider:   Dr Mertie Moores If primary card or EP is not listed click here to update    :1}    Thank you for choosing [facility]!!      Signed, Mertie Moores, MD  09/12/2021 4:44 PM    [place]

## 2021-09-12 NOTE — Patient Instructions (Signed)
Medication Instructions:  The current medical regimen is effective;  continue present plan and medications.  *If you need a refill on your cardiac medications before your next appointment, please call your pharmacy*  Testing/Procedures: Your physician has requested that you have an echocardiogram in 1 year (before seeing him back). Echocardiography is a painless test that uses sound waves to create images of your heart. It provides your doctor with information about the size and shape of your heart and how well your hearts chambers and valves are working. This procedure takes approximately one hour. There are no restrictions for this procedure.  Follow-Up: At [facility], you and your health needs are our priority.  As part of our continuing mission to provide you with exceptional heart care, we have created designated Provider Care Teams.  These Care Teams include your primary Cardiologist (physician) and Advanced Practice Providers (APPs -  Physician Assistants and Nurse Practitioners) who all work together to provide you with the care you need, when you need it.  We recommend signing up for the patient portal called "MyChart".  Sign up information is provided on this After Visit Summary.  MyChart is used to connect with patients for Virtual Visits (Telemedicine).  Patients are able to view lab/test results, encounter notes, upcoming appointments, etc.  Non-urgent messages can be sent to your provider as well.   To learn more about what you can do with MyChart, go to [email or web address].    Your next appointment:   1 year(s)  The format for your next appointment:   In Person  Provider:   Dr Mertie Moores If primary card or EP is not listed click here to update    :1}    Thank you for choosing [facility]!!

## 2021-09-19 DIAGNOSIS — H4031X2 Glaucoma secondary to eye trauma, right eye, moderate stage: Secondary | ICD-10-CM | POA: Diagnosis not present

## 2021-11-08 DIAGNOSIS — H4031X2 Glaucoma secondary to eye trauma, right eye, moderate stage: Secondary | ICD-10-CM | POA: Diagnosis not present

## 2021-11-08 DIAGNOSIS — H524 Presbyopia: Secondary | ICD-10-CM | POA: Diagnosis not present

## 2021-11-08 DIAGNOSIS — H26493 Other secondary cataract, bilateral: Secondary | ICD-10-CM | POA: Diagnosis not present

## 2021-11-08 DIAGNOSIS — H04123 Dry eye syndrome of bilateral lacrimal glands: Secondary | ICD-10-CM | POA: Diagnosis not present

## 2021-12-13 ENCOUNTER — Ambulatory Visit (INDEPENDENT_AMBULATORY_CARE_PROVIDER_SITE_OTHER): Payer: Medicare HMO | Admitting: Family

## 2021-12-13 VITALS — BP 130/82 | HR 96 | Temp 97.7°F | Resp 18 | Ht 73.0 in | Wt 233.4 lb

## 2021-12-13 DIAGNOSIS — E782 Mixed hyperlipidemia: Secondary | ICD-10-CM

## 2021-12-13 DIAGNOSIS — Z1211 Encounter for screening for malignant neoplasm of colon: Secondary | ICD-10-CM

## 2021-12-13 DIAGNOSIS — R7303 Prediabetes: Secondary | ICD-10-CM | POA: Diagnosis not present

## 2021-12-13 LAB — COMPREHENSIVE METABOLIC PANEL
ALT: 15 U/L (ref 0–53)
AST: 15 U/L (ref 0–37)
Albumin: 4.4 g/dL (ref 3.5–5.2)
Alkaline Phosphatase: 91 U/L (ref 39–117)
BUN: 25 mg/dL — ABNORMAL HIGH (ref 6–23)
CO2: 26 mEq/L (ref 19–32)
Calcium: 9.4 mg/dL (ref 8.4–10.5)
Chloride: 105 mEq/L (ref 96–112)
Creatinine, Ser: 0.89 mg/dL (ref 0.40–1.50)
GFR: 83.56 mL/min (ref >60.00)
Glucose, Bld: 113 mg/dL — ABNORMAL HIGH (ref 70–99)
Potassium: 4.9 mEq/L (ref 3.5–5.1)
Sodium: 139 mEq/L (ref 135–145)
Total Bilirubin: 1.1 mg/dL (ref 0.2–1.2)
Total Protein: 6.5 g/dL (ref 6.0–8.3)

## 2021-12-13 LAB — CBC WITH DIFFERENTIAL/PLATELET
Basophils Absolute: 0 10*3/uL (ref 0.0–0.1)
Basophils Relative: 0.7 % (ref 0.0–3.0)
Eosinophils Absolute: 0.1 10*3/uL (ref 0.0–0.7)
Eosinophils Relative: 2.1 % (ref 0.0–5.0)
HCT: 46.4 % (ref 39.0–52.0)
Hemoglobin: 15.8 g/dL (ref 13.0–17.0)
Lymphocytes Relative: 21.2 % (ref 12.0–46.0)
Lymphs Abs: 1.3 10*3/uL (ref 0.7–4.0)
MCHC: 34 g/dL (ref 30.0–36.0)
MCV: 90.3 fl (ref 78.0–100.0)
Monocytes Absolute: 0.7 10*3/uL (ref 0.1–1.0)
Monocytes Relative: 10.9 % (ref 3.0–12.0)
Neutro Abs: 4 10*3/uL (ref 1.4–7.7)
Neutrophils Relative %: 65.1 % (ref 43.0–77.0)
Platelets: 177 10*3/uL (ref 150.0–400.0)
RBC: 5.14 Mil/uL (ref 4.22–5.81)
RDW: 13.2 % (ref 11.5–15.5)
WBC: 6.2 10*3/uL (ref 4.0–10.5)

## 2021-12-13 LAB — LIPID PANEL
Cholesterol: 159 mg/dL (ref 0–200)
HDL: 42.1 mg/dL (ref >39.00)
LDL Cholesterol: 95 mg/dL (ref 0–99)
NonHDL: 117.33
Total CHOL/HDL Ratio: 4
Triglycerides: 112 mg/dL (ref 0.0–149.0)
VLDL: 22.4 mg/dL (ref 0.0–40.0)

## 2021-12-13 LAB — HEMOGLOBIN A1C: Hgb A1c MFr Bld: 5.4 % (ref 4.6–6.5)

## 2021-12-13 MED ORDER — SIMVASTATIN 20 MG PO TABS: ORAL_TABLET | ORAL | 3 refills | Status: DC

## 2021-12-13 NOTE — Progress Notes (Signed)
[name] is a 76 y.o. male with the following history as recorded in EpicCare:  Patient Active Problem List   Diagnosis Date Noted   Chest tightness 09/17/2020   Mitral regurgitation 09/10/2019   Encounter for general adult medical examination with abnormal findings 01/06/2016   Prostate cancer ([place]) 10/16/2012   DEGENERATIVE JOINT DISEASE, BOTH KNEES, SEVERE 02/17/2010   Essential hypertension 01/07/2009   Diverticulosis of large intestine 01/07/2009   Hyperglycemia 01/07/2009   Glaucoma 02/25/2008   ARTHRITIS 02/25/2008   HYPERLIPIDEMIA 07/26/2007   Esophagitis 07/26/2007    Current Outpatient Medications  Medication Sig Dispense Refill   aspirin 81 MG tablet Take 81 mg by mouth daily.     dorzolamide-timolol (COSOPT) 22.3-6.8 MG/ML ophthalmic solution 1 drop 2 (two) times daily.     simvastatin (ZOCOR) 20 MG tablet TAKE 1 TABLET BY MOUTH EVERYDAY AT BEDTIME 90 tablet 3   telmisartan (MICARDIS) 80 MG tablet Take 1 tablet (80 mg total) by mouth daily. 90 tablet 3   No current facility-administered medications for this visit.   Facility-Administered Medications Ordered in Other Visits  Medication Dose Route Frequency Provider Last Rate Last Admin   regadenoson (LEXISCAN) injection SOLN 0.4 mg  0.4 mg Intravenous Once Cherlynn Kaiser A, MD       technetium tetrofosmin (TC-MYOVIEW) injection 44.9 millicurie  75.3 millicurie Intravenous Once PRN Cherlynn Kaiser A, MD       technetium tetrofosmin (TC-MYOVIEW) injection 00.5 millicurie  11.0 millicurie Intravenous Once PRN Elouise Munroe, MD        Allergies: Penicillins, Lisinopril, and Other  Past Medical History:  Diagnosis Date   Allergy    Arthritis    ARTHRITIS 02/25/2008   Qualifier: Diagnosis of  By: Smith NCMA, [place], BOTH KNEES, SEVERE 02/17/2010   Qualifier: Diagnosis of  By: Elna Breslow     Diverticulosis of large intestine 01/07/2009   Qualifier: Diagnosis of  By:  Linna Darner MD, [place]   Colonoscopy 2009;Dr  Kaplan,[facility]     Erectile dysfunction    Esophagitis 07/26/2007   Qualifier: Diagnosis of  By: Linna Darner MD, [place] hypertension 01/07/2009   Qualifier: Diagnosis of  By: Linna Darner MD, Rona Ravens    OD only; from remote trauma (open angle)   GLAUCOMA, PRIMARY OPEN-ANGLE 02/25/2008   Qualifier: Diagnosis of  By: Marland Mcalpine   OD only;Dr Dionne Ano, [place]     Heart murmur    Hyperglycemia 01/07/2009   Qualifier: Diagnosis of  By: Linna Darner MD, Gwyndolyn Saxon   No [place] DM    Hyperlipidemia    HYPERLIPIDEMIA 07/26/2007   Qualifier: Diagnosis of  By: Linna Darner MD, Cristopher Estimable Lipoprofile 2010: LDL 93 (1800/ 1237), HDL 38, TG 153.  Minimal LDL goal = < 100, preferred goal = < 70. Mother MI @ 23. Father CVA @ 54     Hypertension    Prostate cancer ([place]) 10/16/12   Adenocarcinoma    Past Surgical History:  Procedure Laterality Date   CATARACT EXTRACTION Left 06/20/2018   COLONOSCOPY  2009   Dr Deatra Ina; due 2019   LYMPHADENECTOMY Bilateral 02/27/2013   Procedure: LYMPHADENECTOMY;  Surgeon: Dutch Gray, MD;  [facility];  Service: Urology;  Laterality: Bilateral;   ROBOT ASSISTED LAPAROSCOPIC RADICAL PROSTATECTOMY N/A 02/27/2013   Procedure: ROBOTIC ASSISTED LAPAROSCOPIC RADICAL PROSTATECTOMY LEVEL 2;  Surgeon: Dutch Gray, MD;  [facility];  Service:  Urology;  Laterality: N/A;   TONSILLECTOMY     age 5 Dr.Yut   TOTAL KNEE ARTHROPLASTY  07/2010   Dr Maureen Ralphs   undescended testicle     age 30    Family History  Problem Relation Age of Onset   Heart attack Mother 34   Stroke Father 84   Osteoarthritis Brother    Cancer Neg Hx    Diabetes Neg Hx     Social History   Tobacco Use   Smoking status: Never   Smokeless tobacco: Never  Substance Use Topics   Alcohol use: No    Subjective:  Presents for yearly CPE; no acute concerns today; does see cardiology and urology; sees dentist and ophthalmology regularly;   Review  of Systems  Constitutional: Negative.   HENT: Negative.    Eyes: Negative.   Respiratory: Negative.    Cardiovascular: Negative.   Gastrointestinal: Negative.   Genitourinary: Negative.   Musculoskeletal: Negative.   Skin: Negative.   Neurological: Negative.   Endo/Heme/Allergies: Negative.   Psychiatric/Behavioral: Negative.          Objective:  Vitals:   12/13/21 0831  BP: 130/82  Pulse: 96  Resp: 18  Temp: 97.7 F (36.5 C)  TempSrc: Oral  SpO2: 96%  Weight: 233 lb 6.4 oz (105.9 kg)  Height: 6' 1" (1.854 m)    General: Well developed, well nourished, in no acute distress  Skin : Warm and dry.  Head: Normocephalic and atraumatic  Eyes: Sclera and conjunctiva clear; pupils round and reactive to light; extraocular movements intact  Ears: External normal; canals clear; tympanic membranes normal  Oropharynx: Pink, supple. No suspicious lesions  Neck: Supple without thyromegaly, adenopathy  Lungs: Respirations unlabored; clear to auscultation bilaterally without wheeze, rales, rhonchi  CVS exam: normal rate and regular rhythm.  Abdomen: Soft; nontender; nondistended; normoactive bowel sounds; no masses or hepatosplenomegaly  Musculoskeletal: No deformities; no active joint inflammation  Extremities: No edema, cyanosis, clubbing  Vessels: Symmetric bilaterally  Neurologic: Alert and oriented; speech intact; face symmetrical; moves all extremities well; CNII-XII intact without focal deficit  Assessment:  1. HYPERLIPIDEMIA   2. Pre-diabetes   3. Colon cancer screening     Plan:  Age appropriate preventive healthcare needs addressed; encouraged regular eye doctor and dental exams; encouraged regular exercise; will update labs and refills as needed today; follow-up to be determined;   No follow-ups on file.  Orders Placed This Encounter  Procedures   CBC with Differential/Platelet   Comp Met (CMET)   Hemoglobin A1c   Lipid panel   Cologuard    Requested  Prescriptions    No prescriptions requested or ordered in this encounter

## 2021-12-14 ENCOUNTER — Other Ambulatory Visit (HOSPITAL_BASED_OUTPATIENT_CLINIC_OR_DEPARTMENT_OTHER): Payer: Self-pay

## 2021-12-14 ENCOUNTER — Encounter: Payer: Self-pay | Admitting: Family

## 2021-12-14 ENCOUNTER — Ambulatory Visit: Payer: Medicare HMO | Attending: Internal Medicine

## 2021-12-14 DIAGNOSIS — Z23 Encounter for immunization: Secondary | ICD-10-CM

## 2021-12-14 MED ORDER — PFIZER COVID-19 VAC BIVALENT 30 MCG/0.3ML IM SUSP
INTRAMUSCULAR | 0 refills | Status: DC
  Filled 2021-12-14: qty 0.3, 1d supply, fill #0

## 2021-12-16 NOTE — Progress Notes (Signed)
   Covid-19 Vaccination Clinic  Name:  [name]    MRN: [id number] DOB: 22-Feb-1946  12/16/2021  [name] was observed post Covid-19 immunization for 15 minutes without incident. He was provided with Vaccine Information Sheet and instruction to access the V-Safe system.   [name] was instructed to call 911 with any severe reactions post vaccine: Difficulty breathing  Swelling of face and throat  A fast heartbeat  A bad rash all over body  Dizziness and weakness   Immunizations Administered     Name Date Dose VIS Date Route   Pfizer Covid-19 Vaccine Bivalent Booster 12/14/2021 12:14 PM 0.3 mL 03/16/2021 Intramuscular   Manufacturer: [place]   Lot: [id number]   [place]: [phone]

## 2021-12-27 LAB — COLOGUARD

## 2022-01-04 ENCOUNTER — Telehealth: Payer: Self-pay | Admitting: Family

## 2022-01-04 NOTE — Telephone Encounter (Signed)
Left message for patient to call back and schedule Medicare Annual Wellness Visit (AWV).   Please offer to do virtually or by telephone.  Left office number and my jabber #[phone].  Last AWV:04/27/2017  Please schedule at anytime with Nurse Health Advisor.   

## 2022-01-09 DIAGNOSIS — Z1211 Encounter for screening for malignant neoplasm of colon: Secondary | ICD-10-CM | POA: Diagnosis not present

## 2022-01-16 LAB — COLOGUARD: COLOGUARD: NEGATIVE

## 2022-02-22 ENCOUNTER — Telehealth: Payer: Self-pay | Admitting: Family

## 2022-02-22 NOTE — Telephone Encounter (Signed)
Left message for patient to call back and schedule Medicare Annual Wellness Visit (AWV).   Please offer to do virtually or by telephone.  Left office number and my jabber [phone].  Last AWV:04/27/2017  Please schedule at anytime with Nurse Health Advisor.

## 2022-02-27 DIAGNOSIS — H4031X2 Glaucoma secondary to eye trauma, right eye, moderate stage: Secondary | ICD-10-CM | POA: Diagnosis not present

## 2022-04-27 ENCOUNTER — Ambulatory Visit (INDEPENDENT_AMBULATORY_CARE_PROVIDER_SITE_OTHER): Payer: Medicare HMO

## 2022-04-27 ENCOUNTER — Ambulatory Visit (INDEPENDENT_AMBULATORY_CARE_PROVIDER_SITE_OTHER): Payer: Medicare HMO | Admitting: *Deleted

## 2022-04-27 DIAGNOSIS — Z0000 Encounter for general adult medical examination without abnormal findings: Secondary | ICD-10-CM

## 2022-04-27 DIAGNOSIS — Z23 Encounter for immunization: Secondary | ICD-10-CM | POA: Diagnosis not present

## 2022-04-27 NOTE — Patient Instructions (Signed)
[name] , Thank you for taking time to come for your Medicare Wellness Visit. I appreciate your ongoing commitment to your health goals. Please review the following plan we discussed and let me know if I can assist you in the future.   These are the goals we discussed:  Goals   None     This is a list of the screening recommended for you and due dates:  Health Maintenance  Topic Date Due   COVID-19 Vaccine (5 - Pfizer risk series) 02/08/2022   Flu Shot  02/14/2022   Tetanus Vaccine  10/02/2030   Pneumonia Vaccine  Completed   Hepatitis C Screening: USPSTF Recommendation to screen - Ages 18-79 yo.  Completed   Zoster (Shingles) Vaccine  Completed   HPV Vaccine  Aged Out   Colon Cancer Screening  Discontinued   Cologuard (Stool DNA test)  Discontinued     Next appointment: Follow up in one year for your annual wellness visit.   Preventive Care 6 Years and Older, Male Preventive care refers to lifestyle choices and visits with your health care provider that can promote health and wellness. What does preventive care include? A yearly physical exam. This is also called an annual well check. Dental exams once or twice a year. Routine eye exams. Ask your health care provider how often you should have your eyes checked. Personal lifestyle choices, including: Daily care of your teeth and gums. Regular physical activity. Eating a healthy diet. Avoiding tobacco and drug use. Limiting alcohol use. Practicing safe sex. Taking low doses of aspirin every day. Taking vitamin and mineral supplements as recommended by your health care provider. What happens during an annual well check? The services and screenings done by your health care provider during your annual well check will depend on your age, overall health, lifestyle risk factors, and family history of disease. Counseling  Your health care provider may ask you questions about your: Alcohol use. Tobacco use. Drug  use. Emotional well-being. Home and relationship well-being. Sexual activity. Eating habits. History of falls. Memory and ability to understand (cognition). Work and work Statistician. Screening  You may have the following tests or measurements: Height, weight, and BMI. Blood pressure. Lipid and cholesterol levels. These may be checked every 5 years, or more frequently if you are over 73 years old. Skin check. Lung cancer screening. You may have this screening every year starting at age 23 if you have a 30-pack-year history of smoking and currently smoke or have quit within the past 15 years. Fecal occult blood test (FOBT) of the stool. You may have this test every year starting at age 34. Flexible sigmoidoscopy or colonoscopy. You may have a sigmoidoscopy every 5 years or a colonoscopy every 10 years starting at age 69. Prostate cancer screening. Recommendations will vary depending on your family history and other risks. Hepatitis C blood test. Hepatitis B blood test. Sexually transmitted disease (STD) testing. Diabetes screening. This is done by checking your blood sugar (glucose) after you have not eaten for a while (fasting). You may have this done every 1-3 years. Abdominal aortic aneurysm (AAA) screening. You may need this if you are a current or former smoker. Osteoporosis. You may be screened starting at age 28 if you are at high risk. Talk with your health care provider about your test results, treatment options, and if necessary, the need for more tests. Vaccines  Your health care provider may recommend certain vaccines, such as: Influenza vaccine. This is recommended  every year. Tetanus, diphtheria, and acellular pertussis (Tdap, Td) vaccine. You may need a Td booster every 10 years. Zoster vaccine. You may need this after age 1. Pneumococcal 13-valent conjugate (PCV13) vaccine. One dose is recommended after age 40. Pneumococcal polysaccharide (PPSV23) vaccine. One dose is  recommended after age 56. Talk to your health care provider about which screenings and vaccines you need and how often you need them. This information is not intended to replace advice given to you by your health care provider. Make sure you discuss any questions you have with your health care provider. Document Released: 07/30/2015 Document Revised: 03/22/2016 Document Reviewed: 05/04/2015 Elsevier Interactive Patient Education  2017 [place] Prevention in the Home Falls can cause injuries. They can happen to people of all ages. There are many things you can do to make your home safe and to help prevent falls. What can I do on the outside of my home? Regularly fix the edges of walkways and driveways and fix any cracks. Remove anything that might make you trip as you walk through a door, such as a raised step or threshold. Trim any bushes or trees on the path to your home. Use bright outdoor lighting. Clear any walking paths of anything that might make someone trip, such as rocks or tools. Regularly check to see if handrails are loose or broken. Make sure that both sides of any steps have handrails. Any raised decks and porches should have guardrails on the edges. Have any leaves, snow, or ice cleared regularly. Use sand or salt on walking paths during winter. Clean up any spills in your garage right away. This includes oil or grease spills. What can I do in the bathroom? Use night lights. Install grab bars by the toilet and in the tub and shower. Do not use towel bars as grab bars. Use non-skid mats or decals in the tub or shower. If you need to sit down in the shower, use a plastic, non-slip stool. Keep the floor dry. Clean up any water that spills on the floor as soon as it happens. Remove soap buildup in the tub or shower regularly. Attach bath mats securely with double-sided non-slip rug tape. Do not have throw rugs and other things on the floor that can make you  trip. What can I do in the bedroom? Use night lights. Make sure that you have a light by your bed that is easy to reach. Do not use any sheets or blankets that are too big for your bed. They should not hang down onto the floor. Have a firm chair that has side arms. You can use this for support while you get dressed. Do not have throw rugs and other things on the floor that can make you trip. What can I do in the kitchen? Clean up any spills right away. Avoid walking on wet floors. Keep items that you use a lot in easy-to-reach places. If you need to reach something above you, use a strong step stool that has a grab bar. Keep electrical cords out of the way. Do not use floor polish or wax that makes floors slippery. If you must use wax, use non-skid floor wax. Do not have throw rugs and other things on the floor that can make you trip. What can I do with my stairs? Do not leave any items on the stairs. Make sure that there are handrails on both sides of the stairs and use them. Fix handrails that are broken  or loose. Make sure that handrails are as long as the stairways. Check any carpeting to make sure that it is firmly attached to the stairs. Fix any carpet that is loose or worn. Avoid having throw rugs at the top or bottom of the stairs. If you do have throw rugs, attach them to the floor with carpet tape. Make sure that you have a light switch at the top of the stairs and the bottom of the stairs. If you do not have them, ask someone to add them for you. What else can I do to help prevent falls? Wear shoes that: Do not have high heels. Have rubber bottoms. Are comfortable and fit you well. Are closed at the toe. Do not wear sandals. If you use a stepladder: Make sure that it is fully opened. Do not climb a closed stepladder. Make sure that both sides of the stepladder are locked into place. Ask someone to hold it for you, if possible. Clearly mark and make sure that you can  see: Any grab bars or handrails. First and last steps. Where the edge of each step is. Use tools that help you move around (mobility aids) if they are needed. These include: Canes. Walkers. Scooters. Crutches. Turn on the lights when you go into a dark area. Replace any light bulbs as soon as they burn out. Set up your furniture so you have a clear path. Avoid moving your furniture around. If any of your floors are uneven, fix them. If there are any pets around you, be aware of where they are. Review your medicines with your doctor. Some medicines can make you feel dizzy. This can increase your chance of falling. Ask your doctor what other things that you can do to help prevent falls. This information is not intended to replace advice given to you by your health care provider. Make sure you discuss any questions you have with your health care provider. Document Released: 04/29/2009 Document Revised: 12/09/2015 Document Reviewed: 08/07/2014 Elsevier Interactive Patient Education  2017 [employer].

## 2022-04-27 NOTE — Progress Notes (Addendum)
Subjective:   [name] is a 76 y.o. male who presents for Medicare Annual/Subsequent preventive examination.  I connected with  Draysen N Trosper on 04/27/22 by a audio enabled telemedicine application and verified that I am speaking with the correct person using two identifiers.  Patient Location: Home  Provider Location: Office/Clinic  I discussed the limitations of evaluation and management by telemedicine. The patient expressed understanding and agreed to proceed.   Review of Systems    Defer to PCP Cardiac Risk Factors include: advanced age (>49mn, >>39women);hypertension;dyslipidemia;male gender     Objective:    There were no vitals filed for this visit. There is no height or weight on file to calculate BMI.     04/27/2022   11:07 AM 02/27/2013    4:30 PM 02/20/2013    3:29 PM 02/20/2013    2:15 PM  Advanced Directives  Does Patient Have a Medical Advance Directive? Yes Patient has advance directive, copy not in chart Patient has advance directive, copy not in chart Patient has advance directive, copy in chart  Type of Advance Directive [place] will [place]ake [place]imiento[place] will  Living will;Healthcare Power of [place]aypool Hillin Chart? No - copy requested       Current Medications (verified) Outpatient Encounter Medications as of 04/27/2022  Medication Sig   aspirin 81 MG tablet Take 81 mg by mouth daily.   COVID-19 mRNA bivalent vaccine, Pfizer, (PFIZER COVID-19 VAC BIVALENT) injection Inject into the muscle.   dorzolamide-timolol (COSOPT) 22.3-6.8 MG/ML ophthalmic solution 1 drop 2 (two) times daily.   simvastatin (ZOCOR) 20 MG tablet TAKE 1 TABLET BY MOUTH EVERYDAY AT BEDTIME   telmisartan (MICARDIS) 80 MG tablet Take 1 tablet (80 mg total) by mouth daily.   Facility-Administered Encounter Medications as of 04/27/2022  Medication   regadenoson (LEXISCAN) injection SOLN 0.4 mg    technetium tetrofosmin (TC-MYOVIEW) injection 170.2millicurie   technetium tetrofosmin (TC-MYOVIEW) injection 263.7millicurie    Allergies (verified) Penicillins, Lisinopril, and Other   History: Past Medical History:  Diagnosis Date   Allergy    Arthritis    ARTHRITIS 02/25/2008   Qualifier: Diagnosis of  By: SOlyphant BOTH KNEES, SEVERE 02/17/2010   Qualifier: Diagnosis of  By: HElna Breslow    Diverticulosis of large intestine 01/07/2009   Qualifier: Diagnosis of  By: HLinna DarnerMD, William   Colonoscopy 2009;Dr  Kaplan,[facility]     Erectile dysfunction    Esophagitis 07/26/2007   Qualifier: Diagnosis of  By: HLinna DarnerMD, W[place]aver Creekhypertension 01/07/2009   Qualifier: Diagnosis of  By: HLinna DarnerMD, WRona Ravens   OD only; from remote trauma (open angle)   GLAUCOMA, PRIMARY OPEN-ANGLE 02/25/2008   Qualifier: Diagnosis of  By: SMarland Mcalpine  OD only;[facility]     Heart murmur    Hyperglycemia 01/07/2009   Qualifier: Diagnosis of  By: HLinna DarnerMD, WGwyndolyn Saxon  No FH DM    Hyperlipidemia    HYPERLIPIDEMIA 07/26/2007   Qualifier: Diagnosis of  By: HLinna DarnerMD, WCristopher EstimableLipoprofile 2010: LDL 93 (1800/ 1237), HDL 38, TG 153.  Minimal LDL goal = < 100, preferred goal = < 70. Mother MI @ 646 Father CVA @ 717    Hypertension    Prostate cancer (HLake [place] 10/16/12  Adenocarcinoma   Past Surgical History:  Procedure Laterality Date   CATARACT EXTRACTION Left 06/20/2018   COLONOSCOPY  2009   Dr Deatra Ina; due 2019   LYMPHADENECTOMY Bilateral 02/27/2013   Procedure: LYMPHADENECTOMY;  Surgeon: Dutch Gray, MD;  Location: [facility];  Service: Urology;  Laterality: Bilateral;   ROBOT ASSISTED LAPAROSCOPIC RADICAL PROSTATECTOMY N/A 02/27/2013   Procedure: ROBOTIC ASSISTED LAPAROSCOPIC RADICAL PROSTATECTOMY LEVEL 2;  Surgeon: Dutch Gray, MD;  Location: [facility];  Service: Urology;  Laterality: N/A;   TONSILLECTOMY     age 89 Dr.Yut    TOTAL KNEE ARTHROPLASTY  07/2010   Dr Maureen Ralphs   undescended testicle     age 89   Family History  Problem Relation Age of Onset   Heart attack Mother 24   Stroke Father 42   Osteoarthritis Brother    Cancer Neg Hx    Diabetes Neg Hx    Social History   Socioeconomic History   Marital status: Married    Spouse name: Not on file   Number of children: 1   Years of education: 18   Highest education level: Not on file  Occupational History   Occupation: AUDIOLOGIST    Employer: [employer]  Tobacco Use   Smoking status: Never   Smokeless tobacco: Never  Vaping Use   Vaping Use: Never used  Substance and Sexual Activity   Alcohol use: No   Drug use: No   Sexual activity: Not Currently  Other Topics Concern   Not on file  Social History Narrative   Married audiologist   Plans to retire soon.   Social Determinants of Health   Financial Resource Strain: Low Risk  (04/27/2022)   Overall Financial Resource Strain (CARDIA)    Difficulty of Paying Living Expenses: Not hard at all  Food Insecurity: No Food Insecurity (04/27/2022)   Hunger Vital Sign    Worried About Running Out of Food in the Last Year: Never true    Ran Out of Food in the Last Year: Never true  Transportation Needs: No Transportation Needs (04/27/2022)   PRAPARE - Hydrologist (Medical): No    Lack of Transportation (Non-Medical): No  Physical Activity: Insufficiently Active (04/27/2022)   Exercise Vital Sign    Days of Exercise per Week: 3 days    Minutes of Exercise per Session: 30 min  Stress: No Stress Concern Present (04/27/2022)   [place]    Feeling of Stress : Not at all  Social Connections: [place] (04/27/2022)   Social Connection and Isolation Panel [NHANES]    Frequency of Communication with Friends and Family: Twice a week    Frequency of Social Gatherings with Friends and  Family: More than three times a week    Attends Religious Services: More than 4 times per year    Active Member of [employer] or Organizations: Yes    Attends Music therapist: More than 4 times per year    Marital Status: Married    Tobacco Counseling Counseling given: Not Answered   Clinical Intake:  Pre-visit preparation completed: Yes  Pain : No/denies pain  How often do you need to have someone help you when you read instructions, pamphlets, or other written materials from your doctor or pharmacy?: 1 - Never  Diabetic? No  Activities of Daily Living    04/27/2022   11:09 AM  In your present state of health, do you have  any difficulty performing the following activities:  Hearing? 1  Comment wears hearing aids  Vision? 0  Difficulty concentrating or making decisions? 0  Walking or climbing stairs? 0  Dressing or bathing? 0  Doing errands, shopping? 0  Preparing Food and eating ? N  Using the Toilet? N  In the past six months, have you accidently leaked urine? Y  Do you have problems with loss of bowel control? N  Managing your Medications? N  Managing your Finances? N  Housekeeping or managing your Housekeeping? N    Patient Care Team: Marrian Salvage, FNP as PCP - General (Internal Medicine)  Indicate any recent Medical Services you may have received from other than [facility] providers in the past year (date may be approximate).     Assessment:   This is a routine wellness examination for Nikash.  Hearing/Vision screen No results found.  Dietary issues and exercise activities discussed: Current Exercise Habits: Home exercise routine, Type of exercise: walking, Time (Minutes): 30, Frequency (Times/Week): 3, Weekly Exercise (Minutes/Week): 90, Intensity: Moderate, Exercise limited by: None identified   Goals Addressed   None    Depression Screen    04/27/2022   11:08 AM 12/13/2021    8:36 AM 10/01/2020   10:07 AM 08/25/2019   10:33 AM  06/24/2018   10:00 AM 04/27/2017    8:31 AM 01/06/2016    2:26 PM  PHQ 2/9 Scores  PHQ - 2 Score 0 0 0 0 0 0 0    Fall Risk    04/27/2022   11:08 AM 12/13/2021    8:36 AM 10/01/2020   10:01 AM 06/24/2018   10:00 AM 04/27/2017    8:31 AM  Fall Risk   Falls in the past year? 0 0 0 0 No  Number falls in past yr: 0 0 0    Injury with Fall? 0 0 0    Risk for fall due to : No Fall Risks      Follow up Falls evaluation completed  Falls evaluation completed      [place]:  Any stairs in or around the home? Yes  If so, are there any without handrails? No  Home free of loose throw rugs in walkways, pet beds, electrical cords, etc? Yes  Adequate lighting in your home to reduce risk of falls? Yes   ASSISTIVE DEVICES UTILIZED TO PREVENT FALLS:  Life alert? No  Use of a cane, walker or w/c? No  Grab bars in the bathroom? Yes  Shower chair or bench in shower? No  Elevated toilet seat or a handicapped toilet? Yes   TIMED UP AND GO:  Was the test performed?  Audio visit .    Cognitive Function:        04/27/2022   11:12 AM  6CIT Screen  What Year? 0 points  What month? 0 points  What time? 0 points  Count back from 20 0 points  Months in reverse 2 points  Repeat phrase 0 points  Total Score 2 points    Immunizations Immunization History  Administered Date(s) Administered   Fluad Quad(high Dose 65+) 03/20/2019, 04/30/2020, 05/04/2021   Influenza, High Dose Seasonal PF 04/27/2017   Influenza,inj,Quad PF,6+ Mos 07/01/2014, 04/30/2018   Influenza-Unspecified 04/16/2013, 05/15/2015   PFIZER(Purple Top)SARS-COV-2 Vaccination 08/11/2019, 09/01/2019, 04/14/2020   Pfizer Covid-19 Vaccine Bivalent Booster 46yr & up 12/14/2021   Pneumococcal Conjugate-13 01/06/2016   Pneumococcal Polysaccharide-23 08/13/2012   Td 05/13/2010   Tdap  10/01/2020   Zoster Recombinat (Shingrix) 06/30/2017, 09/09/2017    TDAP status: Up to date  Flu Vaccine  status: Due, Education has been provided regarding the importance of this vaccine. Advised may receive this vaccine at local pharmacy or Health Dept. Aware to provide a copy of the vaccination record if obtained from local pharmacy or Health Dept. Verbalized acceptance and understanding.  Pneumococcal vaccine status: Up to date  Covid-19 vaccine status: Information provided on how to obtain vaccines.   Qualifies for Shingles Vaccine? Yes   Zostavax completed No   Shingrix Completed?: Yes  Screening Tests Health Maintenance  Topic Date Due   COVID-19 Vaccine (5 - Pfizer risk series) 02/08/2022   INFLUENZA VACCINE  02/14/2022   TETANUS/TDAP  10/02/2030   Pneumonia Vaccine 65+ Years old  Completed   Hepatitis C Screening  Completed   Zoster Vaccines- Shingrix  Completed   HPV VACCINES  Aged Out   COLONOSCOPY (Pts 45-68yr Insurance coverage will need to be confirmed)  Discontinued   Fecal DNA (Cologuard)  Discontinued    Health Maintenance  Health Maintenance Due  Topic Date Due   COVID-19 Vaccine (5 - Pfizer risk series) 02/08/2022   INFLUENZA VACCINE  02/14/2022    Colorectal cancer screening: Type of screening: Colonoscopy. Completed 09/06/07. Repeat every N/a years  Lung Cancer Screening: (Low Dose CT Chest recommended if Age 76-80years, 30 pack-year currently smoking OR have quit w/in 15years.) does not qualify.   Lung Cancer Screening Referral: N/a  Additional Screening:  Hepatitis C Screening: does qualify; Completed 01/11/16  Vision Screening: Recommended annual ophthalmology exams for early detection of glaucoma and other disorders of the eye. Is the patient up to date with their annual eye exam?  Yes  Who is the provider or what is the name of the office in which the patient attends annual eye exams? Dr. TSatira SarkIf pt is not established with a provider, would they like to be referred to a provider to establish care? No .   Dental Screening: Recommended annual  dental exams for proper oral hygiene  Community Resource Referral / Chronic Care Management: CRR required this visit?  No   CCM required this visit?  No      Plan:     I have personally reviewed and noted the following in the patient's chart:   Medical and social history Use of alcohol, tobacco or illicit drugs  Current medications and supplements including opioid prescriptions. Patient is not currently taking opioid prescriptions. Functional ability and status Nutritional status Physical activity Advanced directives List of other physicians Hospitalizations, surgeries, and ER visits in previous 12 months Vitals Screenings to include cognitive, depression, and falls Referrals and appointments  In addition, I have reviewed and discussed with patient certain preventive protocols, quality metrics, and best practice recommendations. A written personalized care plan for preventive services as well as general preventive health recommendations were provided to patient.   Due to this being a telephonic visit, the after visit summary with patients personalized plan was offered to patient via mail or my-chart. Patient would like to access on my-chart.  BBeatris Ship CRye  04/27/2022   Nurse Notes: None  Medical screening examination/treatment/procedure(s) were performed by non-physician practitioner and as supervising provider I was immediately available for consultation/collaboration.  I agree with above. LMarrian Salvage FNP

## 2022-05-03 DIAGNOSIS — C61 Malignant neoplasm of prostate: Secondary | ICD-10-CM | POA: Diagnosis not present

## 2022-05-10 DIAGNOSIS — C61 Malignant neoplasm of prostate: Secondary | ICD-10-CM | POA: Diagnosis not present

## 2022-05-10 DIAGNOSIS — N3946 Mixed incontinence: Secondary | ICD-10-CM | POA: Diagnosis not present

## 2022-07-06 ENCOUNTER — Encounter: Payer: Self-pay | Admitting: Family

## 2022-08-29 ENCOUNTER — Ambulatory Visit (HOSPITAL_COMMUNITY): Payer: Medicare HMO | Attending: Cardiovascular Disease

## 2022-08-29 DIAGNOSIS — I10 Essential (primary) hypertension: Secondary | ICD-10-CM

## 2022-08-29 DIAGNOSIS — I350 Nonrheumatic aortic (valve) stenosis: Secondary | ICD-10-CM

## 2022-08-29 LAB — ECHOCARDIOGRAM COMPLETE
AR max vel: 0.83 cm2
AV Area VTI: 0.8 cm2
AV Area mean vel: 0.8 cm2
AV Mean grad: 24 mmHg
AV Peak grad: 43 mmHg
Ao pk vel: 3.28 m/s
Area-P 1/2: 3.61 cm2
S' Lateral: 3 cm

## 2022-09-10 ENCOUNTER — Encounter: Payer: Self-pay | Admitting: Cardiovascular Disease

## 2022-09-10 NOTE — Progress Notes (Unsigned)
Cardiology Office Note:    Date:  09/11/2022   ID:  [name], DOB Oct 01, 1945, MRN [id number]  PCP:  Marrian Salvage, FNP  Cardiologist:  Keante Urizar  Electrophysiologist:  None   Referring MD: Marrian Salvage,*   Chief Complaint  Patient presents with   Hypertension   Aortic Stenosis    Previous notes:    [name] is a 77 y.o. male with a hx of heart murmur.  We were asked to see him today by Jodi Mourning, NP for further evaluation of his murmur Hx of HTN HLD Left TKA Prostate Cancer  feels great .   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   [place], [place] regularly 4-5 times a week 30 min - 1 hour Used to weigh 270 - down 50 lbs over the past year Eats a low fat, low carb diet  Mother had CAD - had MI   September 17, 2020: [name] is seen today for follow up of his hypertension and heart murmur. He lost quite a bit of weight last year. Has had some tightness with walking up hills  Seems to be occurring with some regularity  + family hx of CAD , mother died of CAD in 87.   2021-09-27 [name] is seen for follow up of his HTN and chest tightness Lexiscan myoview showed no ischemia.  Normal LV function  Wt is 231 lbs.   Exercises regularly 4-5 times a week , 1-1.5 miles a week .    Feb. 26, 2024 [name] is seen for follow up of his HTN, AS  Wt is 234 lbs   Doing well Exercises, walks regularly  Recent echo shows normal LV function Moderate AS  Trivial MR       Past Medical History:  Diagnosis Date   Allergy    Arthritis    ARTHRITIS 02/25/2008   Qualifier: Diagnosis of  By: Smith NCMA, [place] DISEASE, BOTH KNEES, SEVERE 02/17/2010   Qualifier: Diagnosis of  By: Elna Breslow     Diverticulosis of large intestine 01/07/2009   Qualifier: Diagnosis of  By: Linna Darner MD, William   Colonoscopy 2009;Dr  Kaplan,[facility]     Erectile  dysfunction    Esophagitis 07/26/2007   Qualifier: Diagnosis of  By: Linna Darner MD, [place] hypertension 01/07/2009   Qualifier: Diagnosis of  By: Linna Darner MD, Rona Ravens    OD only; from remote trauma (open angle)   GLAUCOMA, PRIMARY OPEN-ANGLE 02/25/2008   Qualifier: Diagnosis of  By: Marland Mcalpine   OD only;Dr Dionne Ano, [place]     Heart murmur    Hyperglycemia 01/07/2009   Qualifier: Diagnosis of  By: Linna Darner MD, Gwyndolyn Saxon   No FH DM    Hyperlipidemia    HYPERLIPIDEMIA 07/26/2007   Qualifier: Diagnosis of  By: Linna Darner MD, Cristopher Estimable Lipoprofile 2010: LDL 93 (1800/ 1237), HDL 38, TG 153.  Minimal LDL goal = < 100, preferred goal = < 70. Mother MI @ 68. Father CVA @ 69     Hypertension    Prostate cancer ([place]) 10/16/12   Adenocarcinoma    Past Surgical History:  Procedure Laterality Date   CATARACT EXTRACTION Left 06/20/2018   COLONOSCOPY  2009   Dr Deatra Ina; due 2019   LYMPHADENECTOMY Bilateral  02/27/2013   Procedure: LYMPHADENECTOMY;  Surgeon: Dutch Gray, MD;  Location: [facility];  Service: Urology;  Laterality: Bilateral;   ROBOT ASSISTED LAPAROSCOPIC RADICAL PROSTATECTOMY N/A 02/27/2013   Procedure: ROBOTIC ASSISTED LAPAROSCOPIC RADICAL PROSTATECTOMY LEVEL 2;  Surgeon: Dutch Gray, MD;  Location: [facility];  Service: Urology;  Laterality: N/A;   TONSILLECTOMY     age 68 Dr.Yut   TOTAL KNEE ARTHROPLASTY  07/2010   Dr Maureen Ralphs   undescended testicle     age 72    Current Medications: Current Meds  Medication Sig   aspirin 81 MG tablet Take 81 mg by mouth daily.   dorzolamide-timolol (COSOPT) 22.3-6.8 MG/ML ophthalmic solution 1 drop 2 (two) times daily.   simvastatin (ZOCOR) 20 MG tablet TAKE 1 TABLET BY MOUTH EVERYDAY AT BEDTIME   [DISCONTINUED] telmisartan (MICARDIS) 80 MG tablet Take 1 tablet (80 mg total) by mouth daily.     Allergies:   Penicillins, Lisinopril, and Other   Social History   Socioeconomic History   Marital status: Married    Spouse  name: Not on file   Number of children: 1   Years of education: 18   Highest education level: Not on file  Occupational History   Occupation: AUDIOLOGIST    Employer: [employer]  Tobacco Use   Smoking status: Never   Smokeless tobacco: Never  Vaping Use   Vaping Use: Never used  Substance and Sexual Activity   Alcohol use: No   Drug use: No   Sexual activity: Not Currently  Other Topics Concern   Not on file  Social History Narrative   Married audiologist   Plans to retire soon.   Social Determinants of Health   Financial Resource Strain: Low Risk  (04/27/2022)   Overall Financial Resource Strain (CARDIA)    Difficulty of Paying Living Expenses: Not hard at all  Food Insecurity: No Food Insecurity (04/27/2022)   Hunger Vital Sign    Worried About Running Out of Food in the Last Year: Never true    Ran Out of Food in the Last Year: Never true  Transportation Needs: No Transportation Needs (04/27/2022)   PRAPARE - Hydrologist (Medical): No    Lack of Transportation (Non-Medical): No  Physical Activity: Insufficiently Active (04/27/2022)   Exercise Vital Sign    Days of Exercise per Week: 3 days    Minutes of Exercise per Session: 30 min  Stress: No Stress Concern Present (04/27/2022)   [place]    Feeling of Stress : Not at all  Social Connections: [place] (04/27/2022)   Social Connection and Isolation Panel [NHANES]    Frequency of Communication with Friends and Family: Twice a week    Frequency of Social Gatherings with Friends and Family: More than three times a week    Attends Religious Services: More than 4 times per year    Active Member of [employer] or Organizations: Yes    Attends Music therapist: More than 4 times per year    Marital Status: Married     Family History: The patient's family history includes Heart attack (age of onset:  79) in his mother; Osteoarthritis in his brother; Stroke (age of onset: 33) in his father. There is no history of Cancer or Diabetes.  ROS:   Please see the history of present illness.     All other systems reviewed and are negative.  EKGs/Labs/Other Studies Reviewed:  The following studies were reviewed today:    Recent Labs: 12/13/2021: ALT 15; BUN 25; Creatinine, Ser 0.89; Hemoglobin 15.8; Platelets 177.0; Potassium 4.9; Sodium 139  Recent Lipid Panel    Component Value Date/Time   CHOL 159 12/13/2021 0856   TRIG 112.0 12/13/2021 0856   HDL 42.10 12/13/2021 0856   CHOLHDL 4 12/13/2021 0856   VLDL 22.4 12/13/2021 0856   LDLCALC 95 12/13/2021 0856    Physical Exam:      Physical Exam: Blood pressure 120/89, pulse 67, height '6\' 1"'$  (1.854 m), weight 234 lb (106.1 kg), SpO2 97 %.       GEN:  Well nourished, well developed in no acute distress HEENT: Normal NECK: No JVD; No carotid bruits LYMPHATICS: No lymphadenopathy CARDIAC: RRR , no murmurs, rubs, gallops RESPIRATORY:  Clear to auscultation without rales, wheezing or rhonchi  ABDOMEN: Soft, non-tender, non-distended MUSCULOSKELETAL:  No edema; No deformity  SKIN: Warm and dry NEUROLOGIC:  Alert and oriented x 3    EKG:  Feb. 26, 2024 Sinus rhythm, 1st degree AV block     ASSESSMENT:    1. Aortic valve stenosis, etiology of cardiac valve disease unspecified      PLAN:     1.  HTN:   BP is well controlled.     2.  Aortic stenosis :     has moderate AS    3.  Hyperlipidemia:   stable    4.  Chest tightness:      Medication Adjustments/Labs and Tests Ordered: Current medicines are reviewed at length with the patient today.  Concerns regarding medicines are outlined above.  Orders Placed This Encounter  Procedures   EKG 12-Lead   Meds ordered this encounter  Medications   telmisartan (MICARDIS) 80 MG tablet    Sig: Take 1 tablet (80 mg total) by mouth daily.    Dispense:  90 tablet     Refill:  3     Patient Instructions  Medication Instructions:   Your physician recommends that you continue on your current medications as directed. Please refer to the Current Medication list given to you today.  *If you need a refill on your cardiac medications before your next appointment, please call your pharmacy*    Follow-Up: At [facility], you and your health needs are our priority.  As part of our continuing mission to provide you with exceptional heart care, we have created designated Provider Care Teams.  These Care Teams include your primary Cardiologist (physician) and Advanced Practice Providers (APPs -  Physician Assistants and Nurse Practitioners) who all work together to provide you with the care you need, when you need it.  We recommend signing up for the patient portal called "MyChart".  Sign up information is provided on this After Visit Summary.  MyChart is used to connect with patients for Virtual Visits (Telemedicine).  Patients are able to view lab/test results, encounter notes, upcoming appointments, etc.  Non-urgent messages can be sent to your provider as well.   To learn more about what you can do with MyChart, go to [email or web address].    Your next appointment:   1 year(s)  Provider:   DR. Acie Fredrickson    Signed, Mertie Moores, MD  09/11/2022 10:12 AM    [place]

## 2022-09-11 ENCOUNTER — Encounter: Payer: Self-pay | Admitting: Cardiovascular Disease

## 2022-09-11 ENCOUNTER — Ambulatory Visit: Payer: Medicare HMO | Attending: Cardiovascular Disease | Admitting: Cardiovascular Disease

## 2022-09-11 VITALS — BP 120/89 | HR 67 | Ht 73.0 in | Wt 234.0 lb

## 2022-09-11 DIAGNOSIS — I350 Nonrheumatic aortic (valve) stenosis: Secondary | ICD-10-CM | POA: Diagnosis not present

## 2022-09-11 MED ORDER — TELMISARTAN 80 MG PO TABS: 80.0000 mg | ORAL_TABLET | Freq: Every day | ORAL | 3 refills | Status: DC

## 2022-09-11 NOTE — Patient Instructions (Signed)
Medication Instructions:   Your physician recommends that you continue on your current medications as directed. Please refer to the Current Medication list given to you today.  *If you need a refill on your cardiac medications before your next appointment, please call your pharmacy*    Follow-Up: At [facility], you and your health needs are our priority.  As part of our continuing mission to provide you with exceptional heart care, we have created designated Provider Care Teams.  These Care Teams include your primary Cardiologist (physician) and Advanced Practice Providers (APPs -  Physician Assistants and Nurse Practitioners) who all work together to provide you with the care you need, when you need it.  We recommend signing up for the patient portal called "MyChart".  Sign up information is provided on this After Visit Summary.  MyChart is used to connect with patients for Virtual Visits (Telemedicine).  Patients are able to view lab/test results, encounter notes, upcoming appointments, etc.  Non-urgent messages can be sent to your provider as well.   To learn more about what you can do with MyChart, go to [email or web address].    Your next appointment:   1 year(s)  Provider:   DR. Acie Fredrickson

## 2022-10-02 DIAGNOSIS — H4031X2 Glaucoma secondary to eye trauma, right eye, moderate stage: Secondary | ICD-10-CM | POA: Diagnosis not present

## 2022-11-13 DIAGNOSIS — H524 Presbyopia: Secondary | ICD-10-CM | POA: Diagnosis not present

## 2022-11-13 DIAGNOSIS — H0100B Unspecified blepharitis left eye, upper and lower eyelids: Secondary | ICD-10-CM | POA: Diagnosis not present

## 2022-11-13 DIAGNOSIS — H52203 Unspecified astigmatism, bilateral: Secondary | ICD-10-CM | POA: Diagnosis not present

## 2022-11-13 DIAGNOSIS — H4322 Crystalline deposits in vitreous body, left eye: Secondary | ICD-10-CM | POA: Diagnosis not present

## 2022-11-13 DIAGNOSIS — H53002 Unspecified amblyopia, left eye: Secondary | ICD-10-CM | POA: Diagnosis not present

## 2022-11-13 DIAGNOSIS — H0100A Unspecified blepharitis right eye, upper and lower eyelids: Secondary | ICD-10-CM | POA: Diagnosis not present

## 2022-11-13 DIAGNOSIS — H26493 Other secondary cataract, bilateral: Secondary | ICD-10-CM | POA: Diagnosis not present

## 2023-01-04 ENCOUNTER — Encounter: Payer: Self-pay | Admitting: Family

## 2023-01-04 ENCOUNTER — Ambulatory Visit (INDEPENDENT_AMBULATORY_CARE_PROVIDER_SITE_OTHER): Payer: Medicare HMO | Admitting: Family

## 2023-01-04 VITALS — BP 130/80 | HR 70 | Ht 73.0 in | Wt 236.0 lb

## 2023-01-04 DIAGNOSIS — R7309 Other abnormal glucose: Secondary | ICD-10-CM | POA: Diagnosis not present

## 2023-01-04 DIAGNOSIS — E782 Mixed hyperlipidemia: Secondary | ICD-10-CM | POA: Diagnosis not present

## 2023-01-04 DIAGNOSIS — Z0000 Encounter for general adult medical examination without abnormal findings: Secondary | ICD-10-CM

## 2023-01-04 LAB — CBC WITH DIFFERENTIAL/PLATELET
Basophils Absolute: 0 10*3/uL (ref 0.0–0.1)
Basophils Relative: 0.8 % (ref 0.0–3.0)
Eosinophils Absolute: 0.2 10*3/uL (ref 0.0–0.7)
Eosinophils Relative: 3.6 % (ref 0.0–5.0)
HCT: 47.4 % (ref 39.0–52.0)
Hemoglobin: 15.8 g/dL (ref 13.0–17.0)
Lymphocytes Relative: 20.8 % (ref 12.0–46.0)
Lymphs Abs: 1.3 10*3/uL (ref 0.7–4.0)
MCHC: 33.3 g/dL (ref 30.0–36.0)
MCV: 90.1 fl (ref 78.0–100.0)
Monocytes Absolute: 0.6 10*3/uL (ref 0.1–1.0)
Monocytes Relative: 9.1 % (ref 3.0–12.0)
Neutro Abs: 4 10*3/uL (ref 1.4–7.7)
Neutrophils Relative %: 65.7 % (ref 43.0–77.0)
Platelets: 182 10*3/uL (ref 150.0–400.0)
RBC: 5.26 Mil/uL (ref 4.22–5.81)
RDW: 13.3 % (ref 11.5–15.5)
WBC: 6.1 10*3/uL (ref 4.0–10.5)

## 2023-01-04 LAB — COMPREHENSIVE METABOLIC PANEL
ALT: 14 U/L (ref 0–53)
AST: 14 U/L (ref 0–37)
Albumin: 4.1 g/dL (ref 3.5–5.2)
Alkaline Phosphatase: 106 U/L (ref 39–117)
BUN: 22 mg/dL (ref 6–23)
CO2: 27 mEq/L (ref 19–32)
Calcium: 9.2 mg/dL (ref 8.4–10.5)
Chloride: 106 mEq/L (ref 96–112)
Creatinine, Ser: 0.88 mg/dL (ref 0.40–1.50)
GFR: 83.23 mL/min (ref >60.00)
Glucose, Bld: 112 mg/dL — ABNORMAL HIGH (ref 70–99)
Potassium: 5.3 mEq/L — ABNORMAL HIGH (ref 3.5–5.1)
Sodium: 140 mEq/L (ref 135–145)
Total Bilirubin: 1.1 mg/dL (ref 0.2–1.2)
Total Protein: 6.8 g/dL (ref 6.0–8.3)

## 2023-01-04 LAB — HEMOGLOBIN A1C: Hgb A1c MFr Bld: 5.4 % (ref 4.6–6.5)

## 2023-01-04 LAB — LIPID PANEL
Cholesterol: 158 mg/dL (ref 0–200)
HDL: 36 mg/dL — ABNORMAL LOW (ref >39.00)
LDL Cholesterol: 97 mg/dL (ref 0–99)
NonHDL: 121.71
Total CHOL/HDL Ratio: 4
Triglycerides: 125 mg/dL (ref 0.0–149.0)
VLDL: 25 mg/dL (ref 0.0–40.0)

## 2023-01-04 MED ORDER — SIMVASTATIN 20 MG PO TABS
ORAL_TABLET | ORAL | 3 refills | Status: DC
Start: 2023-01-04 — End: 2024-01-21

## 2023-01-04 NOTE — Progress Notes (Signed)
[name] is a 77 y.o. male with the following history as recorded in EpicCare:  Patient Active Problem List   Diagnosis Date Noted   Chest tightness 09/17/2020   Mitral regurgitation 09/10/2019   Encounter for general adult medical examination with abnormal findings 01/06/2016   Prostate cancer (HCC) 10/16/2012   DEGENERATIVE JOINT DISEASE, BOTH KNEES, SEVERE 02/17/2010   Essential hypertension 01/07/2009   Diverticulosis of large intestine 01/07/2009   Hyperglycemia 01/07/2009   Glaucoma 02/25/2008   ARTHRITIS 02/25/2008   HYPERLIPIDEMIA 07/26/2007   Esophagitis 07/26/2007    Current Outpatient Medications  Medication Sig Dispense Refill   aspirin 81 MG tablet Take 81 mg by mouth daily.     dorzolamide-timolol (COSOPT) 22.3-6.8 MG/ML ophthalmic solution 1 drop 2 (two) times daily.     simvastatin (ZOCOR) 20 MG tablet TAKE 1 TABLET BY MOUTH EVERYDAY AT BEDTIME 90 tablet 3   telmisartan (MICARDIS) 80 MG tablet Take 1 tablet (80 mg total) by mouth daily. 90 tablet 3   No current facility-administered medications for this visit.    Allergies: Penicillins, Lisinopril, and Other  Past Medical History:  Diagnosis Date   Allergy    Arthritis    ARTHRITIS 02/25/2008   Qualifier: Diagnosis of  By: Kem Boroughs, Britta Mccreedy     DEGENERATIVE JOINT DISEASE, BOTH KNEES, SEVERE 02/17/2010   Qualifier: Diagnosis of  By: Harold Barban     Diverticulosis of large intestine 01/07/2009   Qualifier: Diagnosis of  By: Alwyn Ren MD, William   Colonoscopy 2009;Dr  Kaplan,[facility]     Erectile dysfunction    Esophagitis 07/26/2007   Qualifier: Diagnosis of  By: Alwyn Ren MD, Chrissie Noa     Essential hypertension 01/07/2009   Qualifier: Diagnosis of  By: Alwyn Ren MD, Kristine Linea    OD only; from remote trauma (open angle)   GLAUCOMA, PRIMARY OPEN-ANGLE 02/25/2008   Qualifier: Diagnosis of  By: Alesia Richards   OD only;Dr Hilbert Corrigan, [facility] Ophth     Heart murmur    Hyperglycemia 01/07/2009    Qualifier: Diagnosis of  By: Alwyn Ren MD, Chrissie Noa   No FH DM    Hyperlipidemia    HYPERLIPIDEMIA 07/26/2007   Qualifier: Diagnosis of  By: Alwyn Ren MD, [place] Parents Lipoprofile 2010: LDL 93 (1800/ 1237), HDL 38, TG 153.  Minimal LDL goal = < 100, preferred goal = < 70. Mother MI @ 55. Father CVA @ 41     Hypertension    Prostate cancer (HCC) 10/16/12   Adenocarcinoma    Past Surgical History:  Procedure Laterality Date   CATARACT EXTRACTION Left 06/20/2018   COLONOSCOPY  2009   Dr Arlyce Dice; due 2019   LYMPHADENECTOMY Bilateral 02/27/2013   Procedure: LYMPHADENECTOMY;  Surgeon: [name], MD;  Location: [facility];  Service: Urology;  Laterality: Bilateral;   ROBOT ASSISTED LAPAROSCOPIC RADICAL PROSTATECTOMY N/A 02/27/2013   Procedure: ROBOTIC ASSISTED LAPAROSCOPIC RADICAL PROSTATECTOMY LEVEL 2;  Surgeon: [name], MD;  Location: [facility];  Service: Urology;  Laterality: N/A;   TONSILLECTOMY     age 48 Dr.Yut   TOTAL KNEE ARTHROPLASTY  07/2010   Dr Despina Hick   undescended testicle     age 17    Family History  Problem Relation Age of Onset   Heart attack Mother 81   Stroke Father 22   Osteoarthritis Brother    Cancer Neg Hx    Diabetes Neg Hx     Social History   Tobacco Use  Smoking status: Never   Smokeless tobacco: Never  Substance Use Topics   Alcohol use: No    Subjective:  Presents for yearly CPE; continuing to work with cardiology and ophthalmology and urology; no acute concerns today;   Review of Systems  Constitutional: Negative.   HENT: Negative.    Eyes: Negative.   Respiratory: Negative.    Cardiovascular: Negative.   Gastrointestinal: Negative.   Genitourinary: Negative.   Musculoskeletal: Negative.   Skin: Negative.   Neurological: Negative.   Endo/Heme/Allergies: Negative.   Psychiatric/Behavioral: Negative.       Objective:  Vitals:   01/04/23 0857  BP: (!) 146/88  Pulse: 70  SpO2: 97%  Weight: 236 lb (107 kg)  Height: 6\' 1"  (1.854 m)    General: Well  developed, well nourished, in no acute distress  Skin : Warm and dry.  Head: Normocephalic and atraumatic  Eyes: Sclera and conjunctiva clear; pupils round and reactive to light; extraocular movements intact  Ears: External normal; canals clear; tympanic membranes normal  Oropharynx: Pink, supple. No suspicious lesions  Neck: Supple without thyromegaly, adenopathy  Lungs: Respirations unlabored; clear to auscultation bilaterally without wheeze, rales, rhonchi  CVS exam: normal rate and regular rhythm.  Abdomen: Soft; nontender; nondistended; normoactive bowel sounds; no masses or hepatosplenomegaly  Musculoskeletal: No deformities; no active joint inflammation  Extremities: No edema, cyanosis, clubbing  Vessels: Symmetric bilaterally  Neurologic: Alert and oriented; speech intact; face symmetrical; moves all extremities well; CNII-XII intact without focal deficit   Assessment:  1. PE (physical exam), annual   2. Elevated glucose     Plan:  Age appropriate preventive healthcare needs addressed; encouraged regular eye doctor and dental exams; encouraged regular exercise; will update labs and refills as needed today; follow-up to be determined;   No follow-ups on file.  Orders Placed This Encounter  Procedures   CBC with Differential/Platelet   Comp Met (CMET)   Lipid panel   Hemoglobin A1c    Requested Prescriptions    No prescriptions requested or ordered in this encounter

## 2023-01-05 ENCOUNTER — Encounter: Payer: Self-pay | Admitting: Family

## 2023-01-05 ENCOUNTER — Other Ambulatory Visit: Payer: Self-pay | Admitting: Family

## 2023-01-05 DIAGNOSIS — R899 Unspecified abnormal finding in specimens from other organs, systems and tissues: Secondary | ICD-10-CM

## 2023-01-09 ENCOUNTER — Other Ambulatory Visit (INDEPENDENT_AMBULATORY_CARE_PROVIDER_SITE_OTHER): Payer: Medicare HMO

## 2023-01-09 DIAGNOSIS — R899 Unspecified abnormal finding in specimens from other organs, systems and tissues: Secondary | ICD-10-CM | POA: Diagnosis not present

## 2023-01-10 LAB — BASIC METABOLIC PANEL
BUN: 22 mg/dL (ref 6–23)
CO2: 27 mEq/L (ref 19–32)
Calcium: 9.3 mg/dL (ref 8.4–10.5)
Chloride: 102 mEq/L (ref 96–112)
Creatinine, Ser: 0.83 mg/dL (ref 0.40–1.50)
GFR: 84.7 mL/min (ref >60.00)
Glucose, Bld: 98 mg/dL (ref 70–99)
Potassium: 4.5 mEq/L (ref 3.5–5.1)
Sodium: 135 mEq/L (ref 135–145)

## 2023-04-02 DIAGNOSIS — H4031X2 Glaucoma secondary to eye trauma, right eye, moderate stage: Secondary | ICD-10-CM | POA: Diagnosis not present

## 2023-04-26 ENCOUNTER — Telehealth: Payer: Self-pay | Admitting: Family

## 2023-04-26 NOTE — Telephone Encounter (Signed)
Copied from CRM [id number]. Topic: Medicare AWV >> Apr 26, 2023  2:03 PM Payton Doughty wrote: Reason for CRM: Called LVM 04/26/2023 to schedule Annual Wellness Visit  Verlee Rossetti; Care Guide Ambulatory Clinical Support [place] [facility] Direct Dial: [phone]

## 2023-05-01 ENCOUNTER — Ambulatory Visit (INDEPENDENT_AMBULATORY_CARE_PROVIDER_SITE_OTHER): Payer: Medicare HMO | Admitting: Radiology

## 2023-05-01 DIAGNOSIS — Z23 Encounter for immunization: Secondary | ICD-10-CM | POA: Diagnosis not present

## 2023-05-01 NOTE — Progress Notes (Signed)
Patient here for HD flu, patient tolerated well with no complications

## 2023-05-21 DIAGNOSIS — H4031X2 Glaucoma secondary to eye trauma, right eye, moderate stage: Secondary | ICD-10-CM | POA: Diagnosis not present

## 2023-09-10 ENCOUNTER — Encounter: Payer: Self-pay | Admitting: Cardiovascular Disease

## 2023-09-10 NOTE — Progress Notes (Unsigned)
 Cardiology Office Note:    Date:  09/11/2023   ID:  [name], DOB 05-31-1946, MRN [id number]  PCP:  Olive Bass, FNP  Cardiologist:  Arminta Gamm  Electrophysiologist:  None   Referring MD: Olive Bass,*   Chief Complaint  Patient presents with   Aortic Stenosis   Hypertension         Previous notes:    [name] is a 78 y.o. male with a hx of heart murmur.  We were asked to see him today by Ria Clock, NP for further evaluation of his murmur Hx of HTN HLD Left TKA Prostate Cancer  feels great .   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   [place], [address],   [place] regularly 4-5 times a week 30 min - 1 hour Used to weigh 270 - down 50 lbs over the past year Eats a low fat, low carb diet  Mother had CAD - had MI   September 17, 2020: [place] is seen today for follow up of his hypertension and heart murmur. He lost quite a bit of weight last year. Has had some tightness with walking up hills  Seems to be occurring with some regularity  + family hx of CAD , mother died of CAD in 38.   09/13/2021 [name] is seen for follow up of his HTN and chest tightness Lexiscan myoview showed no ischemia.  Normal LV function  Wt is 231 lbs.   Exercises regularly 4-5 times a week , 1-1.5 miles a week .    Feb. 26, 2024 [name] is seen for follow up of his HTN, AS  Wt is 234 lbs   Doing well Exercises, walks regularly  Recent echo shows normal LV function Moderate AS  Trivial MR   Feb. 25, 2025  [name] is seen for follow up of his HTN, AS Remains active . No CP ,  Some dyspnea with walking     Past Medical History:  Diagnosis Date   Allergy    Arthritis    ARTHRITIS 02/25/2008   Qualifier: Diagnosis of  By: Kem Boroughs, Britta Mccreedy     DEGENERATIVE JOINT DISEASE, BOTH KNEES, SEVERE 02/17/2010   Qualifier: Diagnosis of  By: Harold Barban     Diverticulosis of large intestine  01/07/2009   Qualifier: Diagnosis of  By: Alwyn Ren MD, William   Colonoscopy 2009;Dr  Kaplan,[facility]     Erectile dysfunction    Esophagitis 07/26/2007   Qualifier: Diagnosis of  By: Alwyn Ren MD, Chrissie Noa     Essential hypertension 01/07/2009   Qualifier: Diagnosis of  By: Alwyn Ren MD, Kristine Linea    OD only; from remote trauma (open angle)   GLAUCOMA, PRIMARY OPEN-ANGLE 02/25/2008   Qualifier: Diagnosis of  By: Alesia Richards   OD only;Dr Hilbert Corrigan, [facility] Ophth     Heart murmur    Hyperglycemia 01/07/2009   Qualifier: Diagnosis of  By: Alwyn Ren MD, Chrissie Noa   No FH DM    Hyperlipidemia    HYPERLIPIDEMIA 07/26/2007   Qualifier: Diagnosis of  By: Alwyn Ren MD, Lynder Parents Lipoprofile 2010: LDL 93 (1800/ 1237), HDL 38, TG 153.  Minimal LDL goal = < 100, preferred goal = < 70. Mother MI @ 8. Father CVA @ 28     Hypertension    Prostate cancer (HCC) 10/16/12   Adenocarcinoma  Past Surgical History:  Procedure Laterality Date   CATARACT EXTRACTION Left 06/20/2018   COLONOSCOPY  2009   Dr Arlyce Dice; due 2019   LYMPHADENECTOMY Bilateral 02/27/2013   Procedure: LYMPHADENECTOMY;  Surgeon: [name], MD;  Location: [facility];  Service: Urology;  Laterality: Bilateral;   ROBOT ASSISTED LAPAROSCOPIC RADICAL PROSTATECTOMY N/A 02/27/2013   Procedure: ROBOTIC ASSISTED LAPAROSCOPIC RADICAL PROSTATECTOMY LEVEL 2;  Surgeon: [name], MD;  Location: [facility];  Service: Urology;  Laterality: N/A;   TONSILLECTOMY     age 79 Dr.Yut   TOTAL KNEE ARTHROPLASTY  07/2010   Dr Despina Hick   undescended testicle     age 63    Current Medications: Current Meds  Medication Sig   aspirin 81 MG tablet Take 81 mg by mouth daily.   simvastatin (ZOCOR) 20 MG tablet TAKE 1 TABLET BY MOUTH EVERYDAY AT BEDTIME   telmisartan (MICARDIS) 80 MG tablet Take 1 tablet (80 mg total) by mouth daily.   timolol (TIMOPTIC) 0.5 % ophthalmic solution Place 1 drop into the right eye 2 (two) times daily.     Allergies:   Penicillins,  Dorzolamide hcl-timolol mal, Lisinopril, and Other   Social History   Socioeconomic History   Marital status: Married    Spouse name: Not on file   Number of children: 1   Years of education: 18   Highest education level: Not on file  Occupational History   Occupation: AUDIOLOGIST    Employer: [employer]  Tobacco Use   Smoking status: Never   Smokeless tobacco: Never  Vaping Use   Vaping status: Never Used  Substance and Sexual Activity   Alcohol use: No   Drug use: No   Sexual activity: Not Currently  Other Topics Concern   Not on file  Social History Narrative   Married audiologist   Plans to retire soon.   Social Drivers of Corporate investment banker Strain: Low Risk  (04/27/2022)   Overall Financial Resource Strain (CARDIA)    Difficulty of Paying Living Expenses: Not hard at all  Food Insecurity: No Food Insecurity (04/27/2022)   Hunger Vital Sign    Worried About Running Out of Food in the Last Year: Never true    Ran Out of Food in the Last Year: Never true  Transportation Needs: No Transportation Needs (04/27/2022)   PRAPARE - Administrator, Civil Service (Medical): No    Lack of Transportation (Non-Medical): No  Physical Activity: Insufficiently Active (04/27/2022)   Exercise Vital Sign    Days of Exercise per Week: 3 days    Minutes of Exercise per Session: 30 min  Stress: No Stress Concern Present (04/27/2022)   [employer] of Occupational Health - Occupational Stress Questionnaire    Feeling of Stress : Not at all  Social Connections: Socially Integrated (04/27/2022)   Social Connection and Isolation Panel [NHANES]    Frequency of Communication with Friends and Family: Twice a week    Frequency of Social Gatherings with Friends and Family: More than three times a week    Attends Religious Services: More than 4 times per year    Active Member of [employer] or Organizations: Yes    Attends Engineer, structural: More than 4  times per year    Marital Status: Married     Family History: The patient's family history includes Heart attack (age of onset: 13) in his mother; Osteoarthritis in his brother; Stroke (age of onset: 55) in his father.  There is no history of Cancer or Diabetes.  ROS:   Please see the history of present illness.     All other systems reviewed and are negative.  EKGs/Labs/Other Studies Reviewed:    The following studies were reviewed today:    Recent Labs: 01/04/2023: ALT 14; Hemoglobin 15.8; Platelets 182.0 01/09/2023: BUN 22; Creatinine, Ser 0.83; Potassium 4.5; Sodium 135  Recent Lipid Panel    Component Value Date/Time   CHOL 158 01/04/2023 0937   TRIG 125.0 01/04/2023 0937   HDL 36.00 (L) 01/04/2023 0937   CHOLHDL 4 01/04/2023 0937   VLDL 25.0 01/04/2023 0937   LDLCALC 97 01/04/2023 0937    Physical Exam:      Physical Exam: Blood pressure 138/88, pulse 89, height 6\' 1"  (1.854 m), weight 237 lb 12.8 oz (107.9 kg), SpO2 95%.       GEN:  Well nourished, well developed in no acute distress HEENT: Normal NECK: No JVD; No carotid bruits LYMPHATICS: No lymphadenopathy CARDIAC: RR  2/6 systolic murmur  RESPIRATORY:  Clear to auscultation without rales, wheezing or rhonchi  ABDOMEN: Soft, non-tender, non-distended MUSCULOSKELETAL:  No edema; No deformity  SKIN: Warm and dry NEUROLOGIC:  Alert and oriented x 3     EKG:    EKG Interpretation Date/Time:  Tuesday September 11 2023 09:07:17 EST Ventricular Rate:  89 PR Interval:  250 QRS Duration:  78 QT Interval:  352 QTC Calculation: 428 R Axis:   -16  Text Interpretation: Sinus rhythm with 1st degree A-V block When compared with ECG of 27-Feb-2013 14:15, Premature supraventricular complexes are no longer Present Confirmed by Kristeen Miss ([id number]) on 09/11/2023 9:23:28 AM       ASSESSMENT:    1. Aortic valve stenosis, etiology of cardiac valve disease unspecified   2. Essential hypertension   3. Mixed  hyperlipidemia       PLAN:     1.  HTN:    BP is generally well controlled.  Cont micardis 80 ng a day     2.  Aortic stenosis :     gradient continues to gradually increase. Will get repeat echo .  We discussed that he is probably a good candidate for a TAVR.  Will refer him to the valve clinic once his aortic stenosis becomes more severe.     3.  Hyperlipidemia:    managed by primary care    4.  Chest tightness:      Medication Adjustments/Labs and Tests Ordered: Current medicines are reviewed at length with the patient today.  Concerns regarding medicines are outlined above.  Orders Placed This Encounter  Procedures   EKG 12-Lead   ECHOCARDIOGRAM COMPLETE   No orders of the defined types were placed in this encounter.    Patient Instructions  ECHO Your physician has requested that you have an echocardiogram. Echocardiography is a painless test that uses sound waves to create images of your heart. It provides your doctor with information about the size and shape of your heart and how well your heart's chambers and valves are working. This procedure takes approximately one hour. There are no restrictions for this procedure. Please do NOT wear cologne, perfume, aftershave, or lotions (deodorant is allowed). Please arrive 15 minutes prior to your appointment time.  Please note: We ask at that you not bring children with you during ultrasound (echo/ vascular) testing. Due to room size and safety concerns, children are not allowed in the ultrasound rooms during exams. Our front office staff  cannot provide observation of children in our lobby area while testing is being conducted. An adult accompanying a patient to their appointment will only be allowed in the ultrasound room at the discretion of the ultrasound technician under special circumstances. We apologize for any inconvenience.   Follow-Up: At [facility], you and your health needs are our priority.  As part  of our continuing mission to provide you with exceptional heart care, we have created designated Provider Care Teams.  These Care Teams include your primary Cardiologist (physician) and Advanced Practice Providers (APPs -  Physician Assistants and Nurse Practitioners) who all work together to provide you with the care you need, when you need it.  Your next appointment:   1 year(s)  Provider:   Kristeen Miss, MD   1st Floor: - Lobby - Registration  - Pharmacy  - Lab - Cafe  2nd Floor: - PV Lab - Diagnostic Testing (echo, CT, nuclear med)  3rd Floor: - Vacant  4th Floor: - TCTS (cardiothoracic surgery) - AFib Clinic - Structural Heart Clinic - Vascular Surgery  - Vascular Ultrasound  5th Floor: - HeartCare Cardiology (general and EP) - Clinical Pharmacy for coumadin, hypertension, lipid, weight-loss medications, and med management appointments    Valet parking services will be available as well.     Signed, Kristeen Miss, MD  09/11/2023 9:43 AM    [facility]

## 2023-09-11 ENCOUNTER — Ambulatory Visit: Payer: Medicare HMO | Attending: Cardiovascular Disease | Admitting: Cardiovascular Disease

## 2023-09-11 ENCOUNTER — Encounter: Payer: Self-pay | Admitting: Cardiovascular Disease

## 2023-09-11 VITALS — BP 138/88 | HR 89 | Ht 73.0 in | Wt 237.8 lb

## 2023-09-11 DIAGNOSIS — I350 Nonrheumatic aortic (valve) stenosis: Secondary | ICD-10-CM

## 2023-09-11 DIAGNOSIS — E782 Mixed hyperlipidemia: Secondary | ICD-10-CM

## 2023-09-11 DIAGNOSIS — I10 Essential (primary) hypertension: Secondary | ICD-10-CM

## 2023-09-11 NOTE — Patient Instructions (Addendum)
 ECHO Your physician has requested that you have an echocardiogram. Echocardiography is a painless test that uses sound waves to create images of your heart. It provides your doctor with information about the size and shape of your heart and how well your heart's chambers and valves are working. This procedure takes approximately one hour. There are no restrictions for this procedure. Please do NOT wear cologne, perfume, aftershave, or lotions (deodorant is allowed). Please arrive 15 minutes prior to your appointment time.  Please note: We ask at that you not bring children with you during ultrasound (echo/ vascular) testing. Due to room size and safety concerns, children are not allowed in the ultrasound rooms during exams. Our front office staff cannot provide observation of children in our lobby area while testing is being conducted. An adult accompanying a patient to their appointment will only be allowed in the ultrasound room at the discretion of the ultrasound technician under special circumstances. We apologize for any inconvenience.   Follow-Up: At [facility], you and your health needs are our priority.  As part of our continuing mission to provide you with exceptional heart care, we have created designated Provider Care Teams.  These Care Teams include your primary Cardiologist (physician) and Advanced Practice Providers (APPs -  Physician Assistants and Nurse Practitioners) who all work together to provide you with the care you need, when you need it.  Your next appointment:   1 year(s)  Provider:   Kristeen Miss, MD   1st Floor: - Lobby - Registration  - Pharmacy  - Lab - Cafe  2nd Floor: - PV Lab - Diagnostic Testing (echo, CT, nuclear med)  3rd Floor: - Vacant  4th Floor: - TCTS (cardiothoracic surgery) - AFib Clinic - Structural Heart Clinic - Vascular Surgery  - Vascular Ultrasound  5th Floor: - HeartCare Cardiology (general and EP) - Clinical  Pharmacy for coumadin, hypertension, lipid, weight-loss medications, and med management appointments    Valet parking services will be available as well.

## 2023-09-25 ENCOUNTER — Ambulatory Visit (HOSPITAL_COMMUNITY): Payer: Medicare HMO | Attending: Cardiology

## 2023-09-25 DIAGNOSIS — I350 Nonrheumatic aortic (valve) stenosis: Secondary | ICD-10-CM | POA: Diagnosis not present

## 2023-09-25 LAB — ECHOCARDIOGRAM COMPLETE
AR max vel: 0.79 cm2
AV Area VTI: 0.82 cm2
AV Area mean vel: 0.75 cm2
AV Mean grad: 29.8 mmHg
AV Peak grad: 50.5 mmHg
Ao pk vel: 3.55 m/s
Area-P 1/2: 3.13 cm2
P 1/2 time: 473 ms
S' Lateral: 2.9 cm

## 2023-09-26 ENCOUNTER — Encounter: Payer: Self-pay | Admitting: Cardiovascular Disease

## 2023-11-05 DIAGNOSIS — H4031X2 Glaucoma secondary to eye trauma, right eye, moderate stage: Secondary | ICD-10-CM | POA: Diagnosis not present

## 2023-11-15 DIAGNOSIS — H4322 Crystalline deposits in vitreous body, left eye: Secondary | ICD-10-CM | POA: Diagnosis not present

## 2023-11-15 DIAGNOSIS — H524 Presbyopia: Secondary | ICD-10-CM | POA: Diagnosis not present

## 2023-11-15 DIAGNOSIS — H53002 Unspecified amblyopia, left eye: Secondary | ICD-10-CM | POA: Diagnosis not present

## 2023-11-15 DIAGNOSIS — H52203 Unspecified astigmatism, bilateral: Secondary | ICD-10-CM | POA: Diagnosis not present

## 2023-11-15 DIAGNOSIS — H4031X2 Glaucoma secondary to eye trauma, right eye, moderate stage: Secondary | ICD-10-CM | POA: Diagnosis not present

## 2023-11-15 DIAGNOSIS — H04123 Dry eye syndrome of bilateral lacrimal glands: Secondary | ICD-10-CM | POA: Diagnosis not present

## 2023-11-30 ENCOUNTER — Other Ambulatory Visit: Payer: Self-pay | Admitting: Cardiovascular Disease

## 2023-11-30 DIAGNOSIS — I350 Nonrheumatic aortic (valve) stenosis: Secondary | ICD-10-CM

## 2024-01-16 ENCOUNTER — Telehealth: Payer: Self-pay | Admitting: Family

## 2024-01-16 NOTE — Telephone Encounter (Signed)
 Copied from CRM [id number]. Topic: Medicare AWV >> Jan 16, 2024  9:40 AM Nathanel DEL wrote: Reason for CRM:LVM 01/16/2024 to schedule AWV. Please schedule Virtual or Telehealth visits ONLY.   Nathanel Paschal; Care Guide Ambulatory Clinical Support [facility] Direct Dial: [phone]

## 2024-01-19 ENCOUNTER — Other Ambulatory Visit: Payer: Self-pay | Admitting: Family

## 2024-01-19 DIAGNOSIS — E782 Mixed hyperlipidemia: Secondary | ICD-10-CM

## 2024-02-12 ENCOUNTER — Ambulatory Visit (INDEPENDENT_AMBULATORY_CARE_PROVIDER_SITE_OTHER): Admitting: Family

## 2024-02-12 ENCOUNTER — Ambulatory Visit: Admitting: Family

## 2024-02-12 ENCOUNTER — Encounter: Payer: Self-pay | Admitting: Family

## 2024-02-12 VITALS — BP 126/80 | HR 88 | Temp 98.1°F | Resp 16 | Ht 73.0 in | Wt 237.1 lb

## 2024-02-12 DIAGNOSIS — Z0000 Encounter for general adult medical examination without abnormal findings: Secondary | ICD-10-CM | POA: Diagnosis not present

## 2024-02-12 DIAGNOSIS — M720 Palmar fascial fibromatosis [Dupuytren]: Secondary | ICD-10-CM

## 2024-02-12 DIAGNOSIS — R7303 Prediabetes: Secondary | ICD-10-CM

## 2024-02-12 DIAGNOSIS — E782 Mixed hyperlipidemia: Secondary | ICD-10-CM | POA: Diagnosis not present

## 2024-02-12 DIAGNOSIS — I340 Nonrheumatic mitral (valve) insufficiency: Secondary | ICD-10-CM | POA: Diagnosis not present

## 2024-02-12 MED ORDER — AMMONIUM LACTATE 12 % EX LOTN
1.0000 | TOPICAL_LOTION | CUTANEOUS | 0 refills | Status: DC | PRN
Start: 2024-02-12 — End: 2024-05-26

## 2024-02-12 MED ORDER — SIMVASTATIN 20 MG PO TABS: 20.0000 mg | ORAL_TABLET | Freq: Every day | ORAL | 3 refills | Status: AC

## 2024-02-12 NOTE — Progress Notes (Signed)
 [name] is a 78 y.o. male with the following history as recorded in EpicCare:  Patient Active Problem List   Diagnosis Date Noted   Chest tightness 09/17/2020   Mitral regurgitation 09/10/2019   Encounter for general adult medical examination with abnormal findings 01/06/2016   Prostate cancer (HCC) 10/16/2012   DEGENERATIVE JOINT DISEASE, BOTH KNEES, SEVERE 02/17/2010   Essential hypertension 01/07/2009   Diverticulosis of large intestine 01/07/2009   Hyperglycemia 01/07/2009   Glaucoma 02/25/2008   Arthropathy 02/25/2008   HYPERLIPIDEMIA 07/26/2007   Esophagitis 07/26/2007    Current Outpatient Medications  Medication Sig Dispense Refill   ammonium lactate  (LAC-HYDRIN ) 12 % lotion Apply 1 Application topically as needed for dry skin. 400 g 0   aspirin 81 MG tablet Take 81 mg by mouth daily.     telmisartan  (MICARDIS ) 80 MG tablet TAKE 1 TABLET BY MOUTH EVERY DAY 90 tablet 3   timolol  (TIMOPTIC ) 0.5 % ophthalmic solution Place 1 drop into the right eye 2 (two) times daily.     simvastatin  (ZOCOR ) 20 MG tablet Take 1 tablet (20 mg total) by mouth at bedtime. 90 tablet 3   No current facility-administered medications for this visit.    Allergies: Penicillins, Dorzolamide hcl-timolol  mal, Lisinopril, and Other  Past Medical History:  Diagnosis Date   Allergy    Arthritis    ARTHRITIS 02/25/2008   Qualifier: Diagnosis of  By: Claudene BANANA, Heron     DEGENERATIVE JOINT DISEASE, BOTH KNEES, SEVERE 02/17/2010   Qualifier: Diagnosis of  By: Caprice Leader     Diverticulosis of large intestine 01/07/2009   Qualifier: Diagnosis of  By: Tish MD, William   Colonoscopy 2009;Dr  Kaplan,[facility]     Erectile dysfunction    Esophagitis 07/26/2007   Qualifier: Diagnosis of  By: Tish MD, Elsie     Essential hypertension 01/07/2009   Qualifier: Diagnosis of  By: Tish MD, Elsie Peels    OD only; from remote trauma (open angle)   GLAUCOMA, PRIMARY OPEN-ANGLE 02/25/2008    Qualifier: Diagnosis of  By: Claudene BANANA Heron   OD only;Dr Thresa Bracket, [facility] Ophth     Heart murmur    Hyperglycemia 01/07/2009   Qualifier: Diagnosis of  By: Tish MD, Elsie   No FH DM    Hyperlipidemia    HYPERLIPIDEMIA 07/26/2007   Qualifier: Diagnosis of  By: Tish MD, Elsie SINE Lipoprofile 2010: LDL 93 (1800/ 1237), HDL 38, TG 153.  Minimal LDL goal = < 100, preferred goal = < 70. Mother MI @ 42. Father CVA @ 27     Hypertension    Prostate cancer (HCC) 10/16/12   Adenocarcinoma    Past Surgical History:  Procedure Laterality Date   CATARACT EXTRACTION Left 06/20/2018   COLONOSCOPY  2009   Dr Debrah; due 2019   LYMPHADENECTOMY Bilateral 02/27/2013   Procedure: LYMPHADENECTOMY;  Surgeon: Noretta Ferrara, MD;  Location: [facility];  Service: Urology;  Laterality: Bilateral;   ROBOT ASSISTED LAPAROSCOPIC RADICAL PROSTATECTOMY N/A 02/27/2013   Procedure: ROBOTIC ASSISTED LAPAROSCOPIC RADICAL PROSTATECTOMY LEVEL 2;  Surgeon: Noretta Ferrara, MD;  Location: [facility];  Service: Urology;  Laterality: N/A;   TONSILLECTOMY     age 29 Dr.Yut   TOTAL KNEE ARTHROPLASTY  07/2010   Dr Hiram   undescended testicle     age 21    Family History  Problem Relation Age of Onset   Heart attack Mother 56   Stroke  Father 50   Osteoarthritis Brother    Cancer Neg Hx    Diabetes Neg Hx     Social History   Tobacco Use   Smoking status: Never   Smokeless tobacco: Never  Substance Use Topics   Alcohol use: No    Subjective:   Presents for yearly CPE;  Continuing to see ophthalmology, cardiology, urology regularly;  Requesting referral to orthopedist; Concerned about worsening fatigue/ easily tired- is concerned that heart valve issue may need to be addressed sooner than originally discussed with his cardiologist; his cardiologist has just retired and does not have a new provider yet;  Review of Systems  Constitutional: Negative.   HENT: Negative.    Eyes: Negative.   Respiratory: Negative.     Cardiovascular: Negative.   Gastrointestinal: Negative.   Genitourinary: Negative.   Musculoskeletal: Negative.   Skin: Negative.   Neurological: Negative.   Endo/Heme/Allergies: Negative.   Psychiatric/Behavioral: Negative.       Objective:  Vitals:   02/12/24 1310  BP: 126/80  Pulse: 88  Resp: 16  Temp: 98.1 F (36.7 C)  TempSrc: Oral  SpO2: 97%  Weight: 237 lb 2 oz (107.6 kg)  Height: 6' 1 (1.854 m)    General: Well developed, well nourished, in no acute distress  Skin : Warm and dry. Contracture noted of left palmar surface; Head: Normocephalic and atraumatic  Eyes: Sclera and conjunctiva clear; pupils round and reactive to light; extraocular movements intact  Ears: External normal; canals clear; tympanic membranes normal  Oropharynx: Pink, supple. No suspicious lesions  Neck: Supple without thyromegaly, adenopathy  Lungs: Respirations unlabored; clear to auscultation bilaterally without wheeze, rales, rhonchi  CVS exam: normal rate and regular rhythm. Murmur noted; Abdomen: Soft; nontender; nondistended; normoactive bowel sounds; no masses or hepatosplenomegaly  Musculoskeletal: No deformities; no active joint inflammation  Extremities: No edema, cyanosis, clubbing  Vessels: Symmetric bilaterally  Neurologic: Alert and oriented; speech intact; face symmetrical; moves all extremities well; CNII-XII intact without focal deficit   Assessment:  1. PE (physical exam), annual   2. Dupuytren contracture of left hand   3. HYPERLIPIDEMIA   4. Nonrheumatic mitral valve regurgitation   5. Pre-diabetes     Plan:  Age appropriate preventive healthcare needs addressed; encouraged regular eye doctor and dental exams; encouraged regular exercise; will update labs and refills as needed today; follow-up in 1 year, sooner prn.  Refer to orthopedics and valvular heart clinic as requested;   No follow-ups on file.  Orders Placed This Encounter  Procedures   CBC with  Differential/Platelet   Comp Met (CMET)   Lipid panel   Hemoglobin A1c   Ambulatory referral to Orthopedic Surgery    Referral Priority:   Routine    Referral Type:   Surgical    Referral Reason:   Specialty Services Required    Requested Specialty:   Orthopedic Surgery    Number of Visits Requested:   1   Ambulatory referral to Structural Heart/Valve Clinic (only at [facility])    Referral Priority:   Routine    Referral Type:   Consultation    Referral Reason:   Specialty Services Required    Requested Specialty:   Cardiology    Number of Visits Requested:   1    Requested Prescriptions   Signed Prescriptions Disp Refills   simvastatin  (ZOCOR ) 20 MG tablet 90 tablet 3    Sig: Take 1 tablet (20 mg total) by mouth at bedtime.   ammonium lactate  (  LAC-HYDRIN ) 12 % lotion 400 g 0    Sig: Apply 1 Application topically as needed for dry skin.

## 2024-02-13 ENCOUNTER — Ambulatory Visit: Payer: Self-pay | Admitting: Family

## 2024-02-13 ENCOUNTER — Telehealth: Payer: Self-pay

## 2024-02-13 LAB — CBC WITH DIFFERENTIAL/PLATELET
Basophils Absolute: 0.1 K/uL (ref 0.0–0.1)
Basophils Relative: 1.5 % (ref 0.0–3.0)
Eosinophils Absolute: 0.1 K/uL (ref 0.0–0.7)
Eosinophils Relative: 1.8 % (ref 0.0–5.0)
HCT: 49 % (ref 39.0–52.0)
Hemoglobin: 16.4 g/dL (ref 13.0–17.0)
Lymphocytes Relative: 19.8 % (ref 12.0–46.0)
Lymphs Abs: 1.4 K/uL (ref 0.7–4.0)
MCHC: 33.4 g/dL (ref 30.0–36.0)
MCV: 90 fl (ref 78.0–100.0)
Monocytes Absolute: 0.6 K/uL (ref 0.1–1.0)
Monocytes Relative: 8.9 % (ref 3.0–12.0)
Neutro Abs: 4.7 K/uL (ref 1.4–7.7)
Neutrophils Relative %: 68 % (ref 43.0–77.0)
Platelets: 185 K/uL (ref 150.0–400.0)
RBC: 5.45 Mil/uL (ref 4.22–5.81)
RDW: 13.5 % (ref 11.5–15.5)
WBC: 6.9 K/uL (ref 4.0–10.5)

## 2024-02-13 LAB — COMPREHENSIVE METABOLIC PANEL WITH GFR
ALT: 13 U/L (ref 0–53)
AST: 18 U/L (ref 0–37)
Albumin: 4.4 g/dL (ref 3.5–5.2)
Alkaline Phosphatase: 92 U/L (ref 39–117)
BUN: 26 mg/dL — ABNORMAL HIGH (ref 6–23)
CO2: 26 meq/L (ref 19–32)
Calcium: 9 mg/dL (ref 8.4–10.5)
Chloride: 105 meq/L (ref 96–112)
Creatinine, Ser: 1.07 mg/dL (ref 0.40–1.50)
GFR: 66.64 mL/min (ref >60.00)
Glucose, Bld: 99 mg/dL (ref 70–99)
Potassium: 4.8 meq/L (ref 3.5–5.1)
Sodium: 139 meq/L (ref 135–145)
Total Bilirubin: 1.3 mg/dL — ABNORMAL HIGH (ref 0.2–1.2)
Total Protein: 6.8 g/dL (ref 6.0–8.3)

## 2024-02-13 LAB — LIPID PANEL
Cholesterol: 175 mg/dL (ref 0–200)
HDL: 40.2 mg/dL (ref >39.00)
LDL Cholesterol: 108 mg/dL — ABNORMAL HIGH (ref 0–99)
NonHDL: 135.28
Total CHOL/HDL Ratio: 4
Triglycerides: 135 mg/dL (ref 0.0–149.0)
VLDL: 27 mg/dL (ref 0.0–40.0)

## 2024-02-13 LAB — HEMOGLOBIN A1C: Hgb A1c MFr Bld: 5.7 % (ref 4.6–6.5)

## 2024-02-13 NOTE — Telephone Encounter (Signed)
 Called the patient to clarify Structural Heart referral for MR received by PCP.  While the patient was unavailable, spoke with his wife (DPR). She stated that Dr. Jason was concerned at his visit yesterday because his murmur seemed worse and he is having increased fatigue and DOE.  The patient had previously been followed by Dr. Alveta for AS/MR and in March 2025 Dr. Alveta recommended echo/follow up in one year, but Dr. Jason requested to reestablish with Cardiology sooner given progressive symptoms.  Ms. Bolander specifically requested Dr. Wonda. Scheduled the patient with Dr. Wonda on 03/05/2024. She was grateful for call and agreed with plan.

## 2024-02-14 DIAGNOSIS — M720 Palmar fascial fibromatosis [Dupuytren]: Secondary | ICD-10-CM | POA: Diagnosis not present

## 2024-03-05 ENCOUNTER — Ambulatory Visit: Attending: Cardiology | Admitting: Cardiovascular Disease

## 2024-03-05 ENCOUNTER — Encounter: Payer: Self-pay | Admitting: Cardiovascular Disease

## 2024-03-05 VITALS — BP 136/88 | HR 82 | Resp 16 | Ht 73.0 in | Wt 236.0 lb

## 2024-03-05 DIAGNOSIS — Z01812 Encounter for preprocedural laboratory examination: Secondary | ICD-10-CM | POA: Diagnosis not present

## 2024-03-05 DIAGNOSIS — I350 Nonrheumatic aortic (valve) stenosis: Secondary | ICD-10-CM

## 2024-03-05 NOTE — Progress Notes (Signed)
 [facility]   MULTIDISCIPLINARY HEART VALVE TEAM  Date:  03/05/2024   ID:  [name], DOB November 13, 1945, MRN [id number]  PCP:  Jason Leita Repine, FNP   No chief complaint on file.    HISTORY OF PRESENT ILLNESS: [name] is a 78 y.o. male who presents for evaluation of aortic stenosis, referred by Dr Alveta.  The patient is a retired Biomedical scientist.  He has been followed for aortic stenosis now for several years.  His most recent echo in March 2025 showed normal LVEF 60 to 65%, normal RV function, trivial MR, and moderately severe aortic stenosis with mean gradient of 30 mmHg, V-max of 3.6 m/s, and calculated aortic valve area of 0.8.  His stroke-volume index is low at 26 and dimensionless index is 0.29.  The patient is moderately active and has exertional dyspnea with walking uphill or at a brisk pace.  He denies exertional chest pain or pressure, lightheadedness, presyncope, or lower extremity edema.  The patient has a history of radical prostatectomy for prostate cancer with no recurrence and a history of knee replacement.  He has otherwise been healthy without any major medical problems. The patient has had regular dental care and reports no problems.  Past Medical History:  Diagnosis Date   Allergy    Arthritis    ARTHRITIS 02/25/2008   Qualifier: Diagnosis of  By: Claudene BANANA, Heron     DEGENERATIVE JOINT DISEASE, BOTH KNEES, SEVERE 02/17/2010   Qualifier: Diagnosis of  By: Caprice Leader     Diverticulosis of large intestine 01/07/2009   Qualifier: Diagnosis of  By: Tish MD, William   Colonoscopy 2009;Dr  Kaplan,[facility]     Erectile dysfunction    Esophagitis 07/26/2007   Qualifier: Diagnosis of  By: Tish MD, Elsie     Essential hypertension 01/07/2009   Qualifier: Diagnosis of  By: Tish MD, Elsie Peels    OD only; from remote trauma (open angle)   GLAUCOMA, PRIMARY OPEN-ANGLE 02/25/2008   Qualifier: Diagnosis of  By: Claudene BANANA Heron   OD only;Dr Thresa Bracket, [facility]     Heart murmur    Hyperglycemia 01/07/2009   Qualifier: Diagnosis of  By: Tish MD, Elsie   No FH DM    Hyperlipidemia    HYPERLIPIDEMIA 07/26/2007   Qualifier: Diagnosis of  By: Tish MD, Elsie SINE Lipoprofile 2010: LDL 93 (1800/ 1237), HDL 38, TG 153.  Minimal LDL goal = < 100, preferred goal = < 70. Mother MI @ 16. Father CVA @ 71     Hypertension    Prostate cancer (HCC) 10/16/12   Adenocarcinoma    Current Outpatient Medications  Medication Sig Dispense Refill   ammonium lactate  (LAC-HYDRIN ) 12 % lotion Apply 1 Application topically as needed for dry skin. 400 g 0   aspirin 81 MG tablet Take 81 mg by mouth daily.     simvastatin  (ZOCOR ) 20 MG tablet Take 1 tablet (20 mg total) by mouth at bedtime. 90 tablet 3   telmisartan  (MICARDIS ) 80 MG tablet TAKE 1 TABLET BY MOUTH EVERY DAY 90 tablet 3   timolol  (TIMOPTIC ) 0.5 % ophthalmic solution Place 1 drop into the right eye 2 (two) times daily.     No current facility-administered medications for this visit.    ALLERGIES:   Penicillins, Dorzolamide hcl-timolol  mal, Lisinopril, and Other   SOCIAL HISTORY:  The patient  reports that he has never smoked. He has  never used smokeless tobacco. He reports that he does not drink alcohol and does not use drugs.   FAMILY HISTORY:  The patient's family history includes Heart attack (age of onset: 32) in his mother; Osteoarthritis in his brother; Stroke (age of onset: 35) in his father.   REVIEW OF SYSTEMS:  Positive for fatigue.   All other systems are reviewed and negative.   PHYSICAL EXAM: VS:  BP 136/88 (BP Location: Right Arm, Patient Position: Sitting, Cuff Size: Normal)   Pulse 82   Resp 16   Ht 6' 1 (1.854 m)   Wt 236 lb (107 kg)   SpO2 96%   BMI 31.14 kg/m  , BMI Body mass index is 31.14 kg/m. GEN: Well nourished, well developed, in no acute distress HEENT: normal Neck: No JVD. carotids 2+ with bilateral bruits Cardiac: The  heart is RRR with with 3/6 harsh late peaking crescendo decrescendo murmur at the right upper sternal border, A2 present but diminished. No edema. Pedal pulses 2+ = bilaterally  Respiratory:  clear to auscultation bilaterally GI: soft, nontender, nondistended, + BS MS: no deformity or atrophy Skin: warm and dry, no rash Neuro:  Strength and sensation are intact Psych: euthymic mood, full affect  RECENT LABS: 02/12/2024: ALT 13; BUN 26; Creatinine, Ser 1.07; Hemoglobin 16.4; Platelets 185.0; Potassium 4.8; Sodium 139  02/12/2024: Cholesterol 175; HDL 40.20; LDL Cholesterol 108; Total CHOL/HDL Ratio 4; Triglycerides 135.0; VLDL 27.0   CrCl cannot be calculated (Patient's most recent lab result is older than the maximum 21 days allowed.).   Wt Readings from Last 3 Encounters:  03/05/24 236 lb (107 kg)  02/12/24 237 lb 2 oz (107.6 kg)  09/11/23 237 lb 12.8 oz (107.9 kg)     CARDIAC STUDIES: Cardiac Studies & Procedures   ______________________________________________________________________________________________   STRESS TESTS  MYOCARDIAL PERFUSION IMAGING 10/27/2020  Interpretation Summary  Nuclear stress EF: 64%.  The left ventricular ejection fraction is normal (55-65%).  There was no ST segment deviation noted during stress.  The study is normal.  This is a low risk study.  No ischemia or infarction on perfusion images. Normal wall motion.   ECHOCARDIOGRAM  ECHOCARDIOGRAM COMPLETE 09/25/2023  Narrative ECHOCARDIOGRAM REPORT    Patient Name:   [name] Date of Exam: 09/25/2023 Medical Rec #:  [id number]          Height:       73.0 in Accession #:    [id number]         Weight:       237.8 lb Date of Birth:  [birth year]          BSA:          2.316 m Patient Age:    77 years           BP:           138/88 mmHg Patient Gender: M                  HR:           88 bpm. Exam Location:  [address]  Procedure: 2D Echo, Cardiac Doppler and Color Doppler (Both  Spectral and Color Flow Doppler were utilized during procedure).  Indications:    I35.0 Aortic Stenosis  History:        Patient has prior history of Echocardiogram examinations, most recent 08/29/2022. Signs/Symptoms:Murmur; Risk Factors:Hypertension and Dyslipidemia.  Sonographer:    Carl Coma RDCS Referring Phys: [phone] PHILIP J NAHSER  IMPRESSIONS   1. Left ventricular ejection fraction, by estimation, is 60 to 65%. The left ventricle has normal function. The left ventricle has no regional wall motion abnormalities. Left ventricular diastolic parameters are consistent with Grade I diastolic dysfunction (impaired relaxation). 2. Right ventricular systolic function is normal. The right ventricular size is normal. 3. Left atrial size was mildly dilated. 4. The mitral valve is normal in structure. Trivial mitral valve regurgitation. No evidence of mitral stenosis. 5. The aortic valve is tricuspid. There is moderate calcification of the aortic valve. Aortic valve regurgitation is trivial. Moderate to severe aortic valve stenosis. Aortic valve area, by VTI measures 0.82 cm. Aortic valve mean gradient measures 29.8 mmHg. Aortic valve Vmax measures 3.55 m/s.  FINDINGS Left Ventricle: Left ventricular ejection fraction, by estimation, is 60 to 65%. The left ventricle has normal function. The left ventricle has no regional wall motion abnormalities. The left ventricular internal cavity size was normal in size. There is no left ventricular hypertrophy. Left ventricular diastolic parameters are consistent with Grade I diastolic dysfunction (impaired relaxation).  Right Ventricle: The right ventricular size is normal. No increase in right ventricular wall thickness. Right ventricular systolic function is normal.  Left Atrium: Left atrial size was mildly dilated.  Right Atrium: Right atrial size was normal in size.  Pericardium: There is no evidence of pericardial  effusion.  Mitral Valve: The mitral valve is normal in structure. Trivial mitral valve regurgitation. No evidence of mitral valve stenosis.  Tricuspid Valve: The tricuspid valve is normal in structure. Tricuspid valve regurgitation is not demonstrated. No evidence of tricuspid stenosis.  The aortic valve is tricuspid. There is moderate calcification of the aortic valve. Aortic valve regurgitation is trivial. Moderate to severe aortic stenosis is present. Pulmonic Valve: The pulmonic valve was normal in structure. Pulmonic valve regurgitation is not visualized. No evidence of pulmonic stenosis.  Aorta: The aortic root is normal in size and structure.  Venous: The inferior vena cava was not well visualized.  IAS/Shunts: No atrial level shunt detected by color flow Doppler.   LEFT VENTRICLE PLAX 2D LVIDd:         4.30 cm   Diastology LVIDs:         2.90 cm   LV e' medial:    8.22 cm/s LV PW:         0.90 cm   LV E/e' medial:  10.5 LV IVS:        0.90 cm   LV e' lateral:   12.33 cm/s LVOT diam:     1.90 cm   LV E/e' lateral: 7.0 LV SV:         60 LV SV Index:   26 LVOT Area:     2.84 cm   RIGHT VENTRICLE RV Basal diam:  4.30 cm RV S prime:     14.25 cm/s TAPSE (M-mode): 2.2 cm  LEFT ATRIUM             Index        RIGHT ATRIUM           Index LA diam:        4.40 cm 1.90 cm/m   RA Area:     12.10 cm LA Vol (A2C):   82.3 ml 35.52 ml/m  RA Volume:   28.60 ml  12.35 ml/m LA Vol (A4C):   78.6 ml 33.94 ml/m LA Biplane Vol: 81.3 ml 35.11 ml/m AORTIC VALVE AV Area (Vmax):    0.79 cm AV  Area (Vmean):   0.75 cm AV Area (VTI):     0.82 cm AV Vmax:           355.40 cm/s AV Vmean:          256.400 cm/s AV VTI:            0.732 m AV Peak Grad:      50.5 mmHg AV Mean Grad:      29.8 mmHg LVOT Vmax:         98.95 cm/s LVOT Vmean:        67.750 cm/s LVOT VTI:          0.211 m LVOT/AV VTI ratio: 0.29 AI PHT:            473 msec  AORTA Ao Root diam: 3.50 cm  MITRAL  VALVE MV Area (PHT): 3.13 cm    SHUNTS MV Decel Time: 243 msec    Systemic VTI:  0.21 m MV E velocity: 85.95 cm/s  Systemic Diam: 1.90 cm MV A velocity: 95.55 cm/s MV E/A ratio:  0.90  Toribio Fuel MD Electronically signed by Toribio Fuel MD Signature Date/Time: 09/25/2023/5:05:55 PM    Final          ______________________________________________________________________________________________       ASSESSMENT AND PLAN: Severe, symptomatic aortic stenosis (Stage D3): echo shows preserved LVEF 60-65%, mean gradient 30 mmHg, peak gradient 51 mmHg, dimensionless index 0.29, calculated aortic valve area 0.8 cm, low stroke-volume index of 26.  Patient describes NYHA class 2 functional limitation secondary to shortness of breath and fatigue. I have reviewed the natural history of aortic stenosis with the patient today. We have discussed the limitations of medical therapy and the poor prognosis associated with symptomatic aortic stenosis. We have reviewed potential treatment options, including palliative medical therapy, conventional surgical aortic valve replacement, and transcatheter aortic valve replacement. We discussed treatment options in the context of the patient's specific comorbid medical conditions.  The patient has relatively few comorbidities and these include hypertension and arthritis with history of knee replacement.  The patient is also had prostate cancer with a history of radical prostatectomy.  He appears to now exhibit progressive symptoms related to his aortic stenosis.  At age 28, I think TAVR would be a reasonable treatment option for him as long as he does not have significant CAD or unfavorable anatomy.  He understands that further workup would include cardiac catheterization with coronary angiography and CTA studies of the heart as well as the chest, abdomen, and pelvis.  The patient would like to defer his evaluation until later this year because of some family  circumstances.  He understands to watch out for any signs of progressive dyspnea, chest pain, or lightheadedness/syncope.  As long as his symptoms remain stable, we will plan to evaluate him further with cardiac catheterization followed by CT angiography studies.  Once his workup is completed, he will be referred for formal cardiac surgical consultation as part of multidisciplinary approach to his care.  Informed Consent   Shared Decision Making/Informed Consent The risks [stroke (1 in 1000), death (1 in 1000), kidney failure [usually temporary] (1 in 500), bleeding (1 in 200), allergic reaction [possibly serious] (1 in 200)], benefits (diagnostic support and management of coronary artery disease) and alternatives of a cardiac catheterization were discussed in detail with [name] and he is willing to proceed.      [name] 03/05/2024 3:41 PM

## 2024-03-05 NOTE — Patient Instructions (Signed)
 Medication Instructions:  No medication changes were made at this visit. Continue current regimen.   *If you need a refill on your cardiac medications before your next appointment, please call your pharmacy*  Lab Work: To be completed 1 week prior to heart catheterization: BMP and CBC  If you have labs (blood work) drawn today and your tests are completely normal, you will receive your results only by: MyChart Message (if you have MyChart) OR A paper copy in the mail If you have any lab test that is abnormal or we need to change your treatment, we will call you to review the results.  Testing/Procedures: Your physician has requested that you have an echocardiogram in 3 months (November 2025). Echocardiography is a painless test that uses sound waves to create images of your heart. It provides your doctor with information about the size and shape of your heart and how well your heart's chambers and valves are working. This procedure takes approximately one hour. There are no restrictions for this procedure. Please do NOT wear cologne, perfume, aftershave, or lotions (deodorant is allowed). Please arrive 15 minutes prior to your appointment time.  Please note: We ask at that you not bring children with you during ultrasound (echo/ vascular) testing. Due to room size and safety concerns, children are not allowed in the ultrasound rooms during exams. Our front office staff cannot provide observation of children in our lobby area while testing is being conducted. An adult accompanying a patient to their appointment will only be allowed in the ultrasound room at the discretion of the ultrasound technician under special circumstances. We apologize for any inconvenience.    Your physician has requested that you have a cardiac catheterization on 06/19/24 at 7:30 AM (you will need to arrive by 5:30 AM). Cardiac catheterization is used to diagnose and/or treat various heart conditions. Doctors may recommend  this procedure for a number of different reasons. The most common reason is to evaluate chest pain. Chest pain can be a symptom of coronary artery disease (CAD), and cardiac catheterization can show whether plaque is narrowing or blocking your heart's arteries. This procedure is also used to evaluate the valves, as well as measure the blood flow and oxygen levels in different parts of your heart. For further information please visit [email or web address] Please follow instruction sheet, as given.   Other Instructions       Cardiac/Peripheral Catheterization   You are scheduled for a Cardiac Catheterization on Thursday, December 4 with Dr. Ozell Fell.  1. Please arrive at the [facility] (Main Entrance A) at [facility]: [address] [place], [place] [address] at 5:30 AM (This time is 2 hour(s) before your procedure to ensure your preparation). Your procedure is scheduled to begin at 7:30 AM.  Free valet parking service is available. You will check in at ADMITTING. The support person will be asked to wait in the waiting room.  It is OK to have someone drop you off and come back when you are ready to be discharged.        Special note: Every effort is made to have your procedure done on time. Please understand that emergencies sometimes delay scheduled procedures.  2. Diet: No solid foods after midnight. You may have clear liquids until you arrive at the hospital.  List of approved liquids water , clear juice, clear tea, black coffee, fruit juices, non-citric and without pulp, carbonated beverages, Gatorade, Kool -Aid, plain Jello-O and plain ice popsicles.  3. Hydration:You need  to be well hydrated before your procedure. On December 4, you may drink approved liquids (see below) until 2 hours before with 16 oz of water  as your last intake.   List of approved liquids water , clear juice, clear tea, black coffee, fruit juices, non-citric and without pulp, carbonated beverages,  Gatorade, Kool -Aid, plain Jello-O and plain ice popsicles.  4. Labs: You will need to have blood drawn on Monday, December 1 at [facility] (1st Floor), [address] [place], [place] [address]. You do not need to be fasting.  5. Medication instructions in preparation for your procedure:   Contrast Allergy: No  On the morning of your procedure, take Aspirin 81 mg and any morning medicines NOT listed above.  You may use sips of water .  6. Plan to go home the same day, you will only stay overnight if medically necessary. 7. You MUST have a responsible adult to drive you home. 8. An adult MUST be with you the first 24 hours after you arrive home. 9. Bring a current list of your medications, and the last time and date medication taken. 10. Bring ID and current insurance cards. 11.Please wear clothes that are easy to get on and off and wear slip-on shoes.  Thank you for allowing us  to care for you!   -- [facility]

## 2024-03-10 ENCOUNTER — Encounter: Payer: Self-pay | Admitting: Cardiovascular Disease

## 2024-03-19 ENCOUNTER — Ambulatory Visit

## 2024-03-19 VITALS — BP 136/88 | Ht 73.0 in | Wt 232.0 lb

## 2024-03-19 DIAGNOSIS — Z0000 Encounter for general adult medical examination without abnormal findings: Secondary | ICD-10-CM

## 2024-03-19 NOTE — Patient Instructions (Signed)
 Mr. Bentson , Thank you for taking time out of your busy schedule to complete your Annual Wellness Visit with me. I enjoyed our conversation and look forward to speaking with you again next year. I, as well as your care team,  appreciate your ongoing commitment to your health goals. Please review the following plan we discussed and let me know if I can assist you in the future. Your Game plan/ To Do List    Referrals: If you haven't heard from the office you've been referred to, please reach out to them at the phone provided.   Follow up Visits: We will see or speak with you next year for your Next Medicare AWV with our clinical staff Have you seen your provider in the last 6 months (3 months if uncontrolled diabetes)? Yes  Clinician Recommendations:  Aim for 30 minutes of exercise or brisk walking, 6-8 glasses of water , and 5 servings of fruits and vegetables each day.       This is a list of the screenings recommended for you:  Health Maintenance  Topic Date Due   Flu Shot  02/15/2024   COVID-19 Vaccine (9 - Pfizer risk 2024-25 season) 03/17/2024   Medicare Annual Wellness Visit  03/19/2025   DTaP/Tdap/Td vaccine (4 - Td or Tdap) 10/02/2030   Pneumococcal Vaccine for age over 36  Completed   Hepatitis C Screening  Completed   Zoster (Shingles) Vaccine  Completed   HPV Vaccine  Aged Out   Meningitis B Vaccine  Aged Out   Colon Cancer Screening  Discontinued   Cologuard (Stool DNA test)  Discontinued    Advanced directives: (Copy Requested) Please bring a copy of your health care power of attorney and living will to the office to be added to your chart at your convenience. You can mail to [facility] [address] [place], [place] [address] or email to [email or web address] Advance Care Planning is important because it:  [x]  Makes sure you receive the medical care that is consistent with your values, goals, and preferences  [x]  It provides guidance to your  family and loved ones and reduces their decisional burden about whether or not they are making the right decisions based on your wishes.  Follow the link provided in your after visit summary or read over the paperwork we have mailed to you to help you started getting your Advance Directives in place. If you need assistance in completing these, please reach out to us  so that we can help you!  See attachments for Preventive Care and Fall Prevention Tips.

## 2024-03-19 NOTE — Progress Notes (Signed)
 Because this visit was a virtual/telehealth visit,  certain criteria was not obtained, such a blood pressure, CBG if applicable, and timed get up and go. Any medications not marked as taking were not mentioned during the medication reconciliation part of the visit. Any vitals not documented were not able to be obtained due to this being a telehealth visit or patient was unable to self-report a recent blood pressure reading due to a lack of equipment at home via telehealth. Vitals that have been documented are verbally provided by the patient.  Subjective:   [name] is a 78 y.o. who presents for a Medicare Wellness preventive visit.  As a reminder, Annual Wellness Visits don't include a physical exam, and some assessments may be limited, especially if this visit is performed virtually. We may recommend an in-person follow-up visit with your provider if needed.  Visit Complete: Virtual I connected with  [name] on 03/19/24 by a audio enabled telemedicine application and verified that I am speaking with the correct person using two identifiers.  Patient Location: Home  Provider Location: Home Office  I discussed the limitations of evaluation and management by telemedicine. The patient expressed understanding and agreed to proceed.  Vital Signs: Because this visit was a virtual/telehealth visit, some criteria may be missing or patient reported. Any vitals not documented were not able to be obtained and vitals that have been documented are patient reported.  VideoDeclined- This patient declined Librarian, academic. Therefore the visit was completed with audio only.  Persons Participating in Visit: Patient.  AWV Questionnaire: Yes: Patient Medicare AWV questionnaire was completed by the patient on 03/13/2024; I have confirmed that all information answered by patient is correct and no changes since this date.  Cardiac Risk Factors include: advanced  age (>72men, >63 women);male gender;hypertension;dyslipidemia;obesity (BMI >30kg/m2)     Objective:    Today's Vitals   03/19/24 1444  BP: 136/88  Weight: 232 lb (105.2 kg)  Height: 6' 1 (1.854 m)   Body mass index is 30.61 kg/m.     03/19/2024    2:49 PM 04/27/2022   11:07 AM 02/27/2013    4:30 PM 02/20/2013    3:29 PM 02/20/2013    2:15 PM  Advanced Directives  Does Patient Have a Medical Advance Directive? Yes Yes Patient has advance directive, copy not in chart  Patient has advance directive, copy not in chart  Patient has advance directive, copy in chart   Type of Advance Directive Healthcare Power of [place];Living will Healthcare Power of [place];Living will Healthcare Power of [place];Living will   Living will;Healthcare Power of Attorney   Does patient want to make changes to medical advance directive? No - Patient declined      Copy of Healthcare Power of Attorney in Chart? No - copy requested No - copy requested        Data saved with a previous flowsheet row definition    Current Medications (verified) Outpatient Encounter Medications as of 03/19/2024  Medication Sig   ammonium lactate  (LAC-HYDRIN ) 12 % lotion Apply 1 Application topically as needed for dry skin.   aspirin 81 MG tablet Take 81 mg by mouth daily.   simvastatin  (ZOCOR ) 20 MG tablet Take 1 tablet (20 mg total) by mouth at bedtime.   telmisartan  (MICARDIS ) 80 MG tablet TAKE 1 TABLET BY MOUTH EVERY DAY   timolol  (TIMOPTIC ) 0.5 % ophthalmic solution Place 1 drop into the right eye 2 (two) times daily.   No  facility-administered encounter medications on file as of 03/19/2024.    Allergies (verified) Penicillins, Dorzolamide hcl-timolol  mal, Lisinopril, and Other   History: Past Medical History:  Diagnosis Date   Allergy    Arthritis    ARTHRITIS 02/25/2008   Qualifier: Diagnosis of  By: Claudene GUSS Railing     DEGENERATIVE JOINT DISEASE, BOTH KNEES, SEVERE 02/17/2010   Qualifier: Diagnosis of  By:  Caprice Leader     Diverticulosis of large intestine 01/07/2009   Qualifier: Diagnosis of  By: Tish MD, William   Colonoscopy 2009;Dr  Kaplan,[facility]     Erectile dysfunction    Esophagitis 07/26/2007   Qualifier: Diagnosis of  By: Tish MD, Elsie     Essential hypertension 01/07/2009   Qualifier: Diagnosis of  By: Tish MD, Elsie Peels    OD only; from remote trauma (open angle)   GLAUCOMA, PRIMARY OPEN-ANGLE 02/25/2008   Qualifier: Diagnosis of  By: Claudene GUSS Railing   OD only;Dr Thresa Bracket, [facility]     Heart murmur    Hyperglycemia 01/07/2009   Qualifier: Diagnosis of  By: Tish MD, Elsie   No FH DM    Hyperlipidemia    HYPERLIPIDEMIA 07/26/2007   Qualifier: Diagnosis of  By: Tish MD, Elsie SINE Lipoprofile 2010: LDL 93 (1800/ 1237), HDL 38, TG 153.  Minimal LDL goal = < 100, preferred goal = < 70. Mother MI @ 7. Father CVA @ 71     Hypertension    Prostate cancer (HCC) 10/16/12   Adenocarcinoma   Past Surgical History:  Procedure Laterality Date   CATARACT EXTRACTION Left 06/20/2018   COLONOSCOPY  2009   Dr Debrah; due 2019   LYMPHADENECTOMY Bilateral 02/27/2013   Procedure: LYMPHADENECTOMY;  Surgeon: Noretta Ferrara, MD;  Location: [facility];  Service: Urology;  Laterality: Bilateral;   ROBOT ASSISTED LAPAROSCOPIC RADICAL PROSTATECTOMY N/A 02/27/2013   Procedure: ROBOTIC ASSISTED LAPAROSCOPIC RADICAL PROSTATECTOMY LEVEL 2;  Surgeon: Noretta Ferrara, MD;  Location: [facility];  Service: Urology;  Laterality: N/A;   TONSILLECTOMY     age 35 Dr.Yut   TOTAL KNEE ARTHROPLASTY  07/2010   Dr Hiram   undescended testicle     age 57   Family History  Problem Relation Age of Onset   Heart attack Mother 71   Stroke Father 75   Osteoarthritis Brother    Cancer Neg Hx    Diabetes Neg Hx    Social History   Socioeconomic History   Marital status: Married    Spouse name: Not on file   Number of children: 1   Years of education: 18   Highest education level: Doctorate   Occupational History   Occupation: AUDIOLOGIST    Employer: [employer]  Tobacco Use   Smoking status: Never   Smokeless tobacco: Never  Vaping Use   Vaping status: Never Used  Substance and Sexual Activity   Alcohol use: No   Drug use: No   Sexual activity: Not Currently  Other Topics Concern   Not on file  Social History Narrative   Married audiologist   Plans to retire soon.   Social Drivers of Corporate investment banker Strain: Low Risk  (03/19/2024)   Overall Financial Resource Strain (CARDIA)    Difficulty of Paying Living Expenses: Not hard at all  Food Insecurity: No Food Insecurity (03/19/2024)   Hunger Vital Sign    Worried About Running Out of Food in the Last Year: Never  true    Ran Out of Food in the Last Year: Never true  Transportation Needs: No Transportation Needs (03/19/2024)   PRAPARE - Administrator, Civil Service (Medical): No    Lack of Transportation (Non-Medical): No  Physical Activity: Insufficiently Active (03/19/2024)   Exercise Vital Sign    Days of Exercise per Week: 3 days    Minutes of Exercise per Session: 30 min  Stress: No Stress Concern Present (03/19/2024)   [employer] of Occupational Health - Occupational Stress Questionnaire    Feeling of Stress: Not at all  Social Connections: Socially Integrated (03/19/2024)   Social Connection and Isolation Panel    Frequency of Communication with Friends and Family: More than three times a week    Frequency of Social Gatherings with Friends and Family: More than three times a week    Attends Religious Services: More than 4 times per year    Active Member of [employer] or Organizations: Yes    Attends Banker Meetings: 1 to 4 times per year    Marital Status: Married    Tobacco Counseling Counseling given: Not Answered    Clinical Intake:  Pre-visit preparation completed: Yes  Pain : No/denies pain     BMI - recorded: 30.61 Nutritional Status: BMI > 30   Obese Nutritional Risks: None Diabetes: No  Lab Results  Component Value Date   HGBA1C 5.7 02/12/2024   HGBA1C 5.4 01/04/2023   HGBA1C 5.4 12/13/2021     How often do you need to have someone help you when you read instructions, pamphlets, or other written materials from your doctor or pharmacy?: 1 - Never  Interpreter Needed?: No  Information entered by :: Abbey Veith,CMA   Activities of Daily Living     03/13/2024   11:26 AM  In your present state of health, do you have any difficulty performing the following activities:  Hearing? 0  Vision? 0  Difficulty concentrating or making decisions? 0  Walking or climbing stairs? 1  Dressing or bathing? 0  Doing errands, shopping? 0  Preparing Food and eating ? N  Using the Toilet? N  In the past six months, have you accidently leaked urine? Y  Do you have problems with loss of bowel control? N  Managing your Medications? N  Managing your Finances? N  Housekeeping or managing your Housekeeping? N    Patient Care Team: Jason Leita Repine, FNP as PCP - General (Internal Medicine)  I have updated your Care Teams any recent Medical Services you may have received from other providers in the past year.     Assessment:   This is a routine wellness examination for Zyan.  Hearing/Vision screen Hearing Screening - Comments:: Patient wears hearing aids  Vision Screening - Comments:: Patient wears glasses   Goals Addressed             This Visit's Progress    Patient Stated       Patient would like to lose 15-20 lbs       Depression Screen     03/19/2024    2:49 PM 02/12/2024    1:16 PM 01/04/2023    9:21 AM 04/27/2022   11:08 AM 12/13/2021    8:36 AM 10/01/2020   10:07 AM 08/25/2019   10:33 AM  PHQ 2/9 Scores  PHQ - 2 Score 0 0 0 0 0 0 0  PHQ- 9 Score 0          Fall  Risk     03/13/2024   11:26 AM 02/12/2024    1:16 PM 01/04/2023    9:20 AM 04/27/2022   11:08 AM 12/13/2021    8:36 AM  Fall Risk   Falls  in the past year? 0 0 0 0 0  Number falls in past yr: 0 0 0 0 0  Injury with Fall? 0 0 0 0 0  Risk for fall due to : No Fall Risks  No Fall Risks No Fall Risks   Follow up Falls evaluation completed Falls evaluation completed;Education provided Falls evaluation completed Falls evaluation completed       Data saved with a previous flowsheet row definition    MEDICARE RISK AT HOME:  Medicare Risk at Home Any stairs in or around the home?: (Patient-Rptd) Yes If so, are there any without handrails?: (Patient-Rptd) No Home free of loose throw rugs in walkways, pet beds, electrical cords, etc?: (Patient-Rptd) Yes Adequate lighting in your home to reduce risk of falls?: (Patient-Rptd) Yes Life alert?: (Patient-Rptd) No Use of a cane, walker or w/c?: (Patient-Rptd) No Grab bars in the bathroom?: (Patient-Rptd) Yes Shower chair or bench in shower?: (Patient-Rptd) No Elevated toilet seat or a handicapped toilet?: (Patient-Rptd) Yes  TIMED UP AND GO:  Was the test performed?  No  Cognitive Function: 6CIT completed        03/19/2024    2:46 PM 04/27/2022   11:12 AM  6CIT Screen  What Year? 0 points 0 points  What month? 0 points 0 points  What time? 0 points 0 points  Count back from 20 0 points 0 points  Months in reverse 0 points 2 points  Repeat phrase 0 points 0 points  Total Score 0 points 2 points    Immunizations Immunization History  Administered Date(s) Administered    sv, Bivalent, Protein Subunit Rsvpref,pf (Abrysvo) 06/02/2023   Fluad Quad(high Dose 65+) 03/20/2019, 04/30/2020, 05/04/2021, 04/27/2022   Fluad Trivalent(High Dose 65+) 05/01/2023   INFLUENZA, HIGH DOSE SEASONAL PF 04/27/2017   Influenza,inj,Quad PF,6+ Mos 07/01/2014, 04/30/2018   Influenza-Unspecified 04/16/2013, 05/15/2015   PFIZER Comirnaty(Gray Top)Covid-19 Tri-Sucrose Vaccine 11/17/2020   PFIZER(Purple Top)SARS-COV-2 Vaccination 08/11/2019, 09/01/2019, 04/14/2020   Pfizer Covid-19 Vaccine Bivalent  Booster 55yrs & up 04/12/2021, 12/14/2021, 04/21/2022   Pfizer(Comirnaty)Fall Seasonal Vaccine 12 years and older 04/21/2022, 04/06/2023   Pneumococcal Conjugate-13 01/06/2016   Pneumococcal Polysaccharide-23 08/13/2012   Td 05/25/1997, 05/13/2010   Tdap 10/01/2020   Zoster Recombinant(Shingrix) 06/30/2017, 09/09/2017    Screening Tests Health Maintenance  Topic Date Due   INFLUENZA VACCINE  02/15/2024   COVID-19 Vaccine (9 - Pfizer risk 2024-25 season) 03/17/2024   Medicare Annual Wellness (AWV)  03/19/2025   DTaP/Tdap/Td (4 - Td or Tdap) 10/02/2030   Pneumococcal Vaccine: 50+ Years  Completed   Hepatitis C Screening  Completed   Zoster Vaccines- Shingrix  Completed   HPV VACCINES  Aged Out   Meningococcal B Vaccine  Aged Out   Colonoscopy  Discontinued   Fecal DNA (Cologuard)  Discontinued    Health Maintenance  Health Maintenance Due  Topic Date Due   INFLUENZA VACCINE  02/15/2024   COVID-19 Vaccine (9 - Pfizer risk 2024-25 season) 03/17/2024   Health Maintenance Items Addressed:   Additional Screening:  Vision Screening: Recommended annual ophthalmology exams for early detection of glaucoma and other disorders of the eye. Would you like a referral to an eye doctor? No    Dental Screening: Recommended annual dental exams for proper oral hygiene  Community Resource Referral / Chronic Care Management: CRR required this visit?  No   CCM required this visit?  No   Plan:    I have personally reviewed and noted the following in the patient's chart:   Medical and social history Use of alcohol, tobacco or illicit drugs  Current medications and supplements including opioid prescriptions. Patient is not currently taking opioid prescriptions. Functional ability and status Nutritional status Physical activity Advanced directives List of other physicians Hospitalizations, surgeries, and ER visits in previous 12 months Vitals Screenings to include cognitive,  depression, and falls Referrals and appointments  In addition, I have reviewed and discussed with patient certain preventive protocols, quality metrics, and best practice recommendations. A written personalized care plan for preventive services as well as general preventive health recommendations were provided to patient.   Lyle MARLA Right, [place]   03/19/2024   After Visit Summary: (MyChart) Due to this being a telephonic visit, the after visit summary with patients personalized plan was offered to patient via MyChart   Notes: Nothing significant to report at this time.

## 2024-04-08 ENCOUNTER — Ambulatory Visit (HOSPITAL_COMMUNITY)
Admission: RE | Admit: 2024-04-08 | Discharge: 2024-04-08 | Disposition: A | Discharge status: Home / Self Care | Source: Ambulatory Visit | Attending: Internal Medicine | Admitting: Internal Medicine

## 2024-04-08 DIAGNOSIS — I350 Nonrheumatic aortic (valve) stenosis: Secondary | ICD-10-CM

## 2024-04-09 DIAGNOSIS — H4031X2 Glaucoma secondary to eye trauma, right eye, moderate stage: Secondary | ICD-10-CM | POA: Diagnosis not present

## 2024-04-10 ENCOUNTER — Ambulatory Visit: Admitting: Family Medicine

## 2024-04-10 ENCOUNTER — Encounter: Payer: Self-pay | Admitting: Family Medicine

## 2024-04-10 VITALS — BP 110/80 | HR 91 | Temp 98.8°F | Ht 73.0 in | Wt 235.8 lb

## 2024-04-10 DIAGNOSIS — R051 Acute cough: Secondary | ICD-10-CM

## 2024-04-10 DIAGNOSIS — R0981 Nasal congestion: Secondary | ICD-10-CM | POA: Insufficient documentation

## 2024-04-10 DIAGNOSIS — U071 COVID-19: Secondary | ICD-10-CM | POA: Diagnosis not present

## 2024-04-10 LAB — POC COVID19 BINAXNOW: SARS Coronavirus 2 Ag: POSITIVE — AB

## 2024-04-10 LAB — POCT INFLUENZA A/B
Influenza A, POC: NEGATIVE
Influenza B, POC: NEGATIVE

## 2024-04-10 MED ORDER — NIRMATRELVIR/RITONAVIR (PAXLOVID)TABLET: 3.0000 | ORAL_TABLET | Freq: Two times a day (BID) | ORAL | 0 refills | Status: AC

## 2024-04-10 NOTE — Patient Instructions (Addendum)
 I have sent in Paxlovid  for you to take twice a day for 5 days.    DO NOT take your simvastatin  while taking Paxlovid . May resume next week.  I recommend you eat with this medication, as it can upset your stomach if you do not.  This medication can sometimes leave a bad taste in your mouth, it is okay to use mints, gum, to help combat this.   If this occurs, it is temporary and will resolve once the medication course is completed.  There is a minor risk of rebound COVID after taking Paxlovid .  Continue supportive care at home, make sure you are drinking plenty of fluids and getting some rest.  May take ibuprofen and Tylenol  as needed for fever, aches and pains.  Stay home and away from everyone until you are 24 hours fever free without fever reducing medications.  Follow-up with me if symptoms are persisting over the next week.  Follow-up in the emergency room if you are experiencing shortness of breath, high fever, unremitting cough, severe fatigue, other concerning symptoms.

## 2024-04-10 NOTE — Progress Notes (Unsigned)
 Acute Office Visit  Subjective:     Patient ID: [name], male    DOB: April 26, 1946, 78 y.o.   MRN: [id number]  Chief Complaint  Patient presents with   Acute Visit    HPI  Discussed the use of AI scribe software for clinical note transcription with the patient, who gave verbal consent to proceed.  History of Present Illness [name] is a 78 year old male with aortic valve stenosis who presents with COVID-19 symptoms.  Upper respiratory symptoms - Onset of symptoms yesterday - Pressure and pain localized to the frontal sinuses - Persistent cough, worse at night - Mild fever earlier today - No significant fever currently - No gastrointestinal symptoms such as upset stomach  Shortness of breath - Shortness of breath at baseline, attributed to aortic valve stenosis - No acute worsening of dyspnea  Medication use and symptom management - Took two Advil for sinus pressure - Uses Flonase, but perceives limited effectiveness - Current medications include simvastatin , telmisartan , and timolol  - Simvastatin  not taken today, typically taken at night     ROS Per HPI      Objective:    BP 110/80 (BP Location: Left Arm, Patient Position: Sitting)   Pulse 91   Temp 98.8 F (37.1 C) (Temporal)   Ht 6' 1 (1.854 m)   Wt 235 lb 12.8 oz (107 kg)   SpO2 95%   BMI 31.11 kg/m    Physical Exam Vitals and nursing note reviewed.  Constitutional:      General: He is not in acute distress.    Appearance: He is not ill-appearing.     Comments: Appears fatigued  HENT:     Head: Normocephalic and atraumatic.     Right Ear: External ear normal.     Left Ear: External ear normal.     Nose: Congestion present.     Mouth/Throat:     Mouth: Mucous membranes are moist.     Comments: Oropharyngeal cobblestoning   Eyes:     Extraocular Movements: Extraocular movements intact.  Cardiovascular:     Rate and Rhythm: Normal rate and regular rhythm.     Pulses:  Normal pulses.     Heart sounds: Normal heart sounds.  Pulmonary:     Effort: Pulmonary effort is normal. No respiratory distress.     Breath sounds: Normal breath sounds. No wheezing, rhonchi or rales.     Comments: Dry cough  Musculoskeletal:        General: Normal range of motion.     Cervical back: Normal range of motion.     Right lower leg: No edema.     Left lower leg: No edema.  Lymphadenopathy:     Cervical: Cervical adenopathy present.  Skin:    General: Skin is warm and dry.  Neurological:     General: No focal deficit present.     Mental Status: He is alert and oriented to person, place, and time.  Psychiatric:        Mood and Affect: Mood normal.        Behavior: Behavior normal.     Results for orders placed or performed in visit on 04/10/24  POC COVID-19 BinaxNow  Result Value Ref Range   SARS Coronavirus 2 Ag Positive (A) Negative  POCT Influenza A/B  Result Value Ref Range   Influenza A, POC Negative Negative   Influenza B, POC Negative Negative        Assessment & Plan:  Assessment and Plan Assessment & Plan COVID-19 infection, nasal congestion Confirmed COVID-19 with sinus pressure and cough. No fever or pneumonia. Symptoms expected to improve by day three or four. - Prescribe Paxlovid  twice daily for five days. - Instruct to skip simvastatin  during Paxlovid  treatment and for two to three days after completion. - Advise to wear a mask at the wedding if attending.  Acute cough Cough bothersome at night, related to COVID-19. - Instruct to try Robitussin for cough management. - Advise to contact provider if Robitussin is ineffective for further management options.  Nasal congestion associated with COVID-19 Nasal congestion with sinus pressure due to COVID-19. Symptoms expected to be temporary. - Recommend azelastine nasal spray for its protective effects against viral reproduction.      Orders Placed This Encounter  Procedures   POC  COVID-19 BinaxNow   POCT Influenza A/B     Meds ordered this encounter  Medications   nirmatrelvir /ritonavir  (PAXLOVID ) 20 x 150 MG & 10 x 100MG  TABS    Sig: Take 3 tablets by mouth 2 (two) times daily for 5 days. (Take nirmatrelvir  150 mg two tablets twice daily for 5 days and ritonavir  100 mg one tablet twice daily for 5 days) Patient GFR is 66    Dispense:  30 tablet    Refill:  0    Return if symptoms worsen or fail to improve.  Corean LITTIE Ku, FNP

## 2024-04-15 DIAGNOSIS — R051 Acute cough: Secondary | ICD-10-CM | POA: Insufficient documentation

## 2024-04-21 ENCOUNTER — Telehealth: Payer: Self-pay

## 2024-04-21 NOTE — Telephone Encounter (Signed)
 Copied from CRM [id number]. Topic: Clinical - Medication Question >> Apr 21, 2024  9:39 AM Deaijah H wrote: Reason for CRM: Patient stated he's not 100% better and would like to know if Dr. Alvia could prescribe something else. Was also advised to follow up with. Please call.

## 2024-04-22 ENCOUNTER — Encounter: Payer: Self-pay | Admitting: Family Medicine

## 2024-04-22 ENCOUNTER — Ambulatory Visit (INDEPENDENT_AMBULATORY_CARE_PROVIDER_SITE_OTHER): Admitting: Family Medicine

## 2024-04-22 ENCOUNTER — Ambulatory Visit (INDEPENDENT_AMBULATORY_CARE_PROVIDER_SITE_OTHER)

## 2024-04-22 VITALS — BP 130/86 | HR 73 | Temp 98.5°F | Ht 73.0 in | Wt 237.8 lb

## 2024-04-22 DIAGNOSIS — R052 Subacute cough: Secondary | ICD-10-CM | POA: Diagnosis not present

## 2024-04-22 DIAGNOSIS — R0602 Shortness of breath: Secondary | ICD-10-CM | POA: Insufficient documentation

## 2024-04-22 DIAGNOSIS — R059 Cough, unspecified: Secondary | ICD-10-CM | POA: Diagnosis not present

## 2024-04-22 DIAGNOSIS — J22 Unspecified acute lower respiratory infection: Secondary | ICD-10-CM

## 2024-04-22 DIAGNOSIS — J40 Bronchitis, not specified as acute or chronic: Secondary | ICD-10-CM

## 2024-04-22 DIAGNOSIS — U071 COVID-19: Secondary | ICD-10-CM

## 2024-04-22 DIAGNOSIS — J9801 Acute bronchospasm: Secondary | ICD-10-CM | POA: Diagnosis not present

## 2024-04-22 MED ORDER — AIRSUPRA 90-80 MCG/ACT IN AERO: 2.0000 | INHALATION_SPRAY | RESPIRATORY_TRACT | 0 refills | Status: AC | PRN

## 2024-04-22 MED ORDER — AZITHROMYCIN 250 MG PO TABS: ORAL_TABLET | ORAL | 0 refills | Status: AC

## 2024-04-22 NOTE — Progress Notes (Signed)
 Morphology  Acute Office Visit  Subjective:     Patient ID: [name], male    DOB: Jun 22, 1946, 79 y.o.   MRN: [id number]  No chief complaint on file.   HPI  Discussed the use of AI scribe software for clinical note transcription with the patient, who gave verbal consent to proceed.  History of Present Illness [name] is a 78 year old male with aortic valve stenosis who presents with persistent cough and shortness of breath following a recent COVID-19 infection.  Respiratory symptoms - Persistent cough following recent COVID-19 infection - Cough is bothersome and associated with chest soreness - Shortness of breath occurs with deep inhalation and is more severe than baseline dyspnea related to aortic valve stenosis - Difficulty taking deep breaths and increased work of breathing compared to usual - Concern for bronchitis, pneumonia, or recurrence of COVID-19 - Feels something is 'not right' when inhaling  Recent covid-19 infection and treatment - Recent COVID-19 infection with initial improvement after five days of treatment - Completed a five-day course of Paxlovid  - Worsening respiratory symptoms over the last couple of days after initial improvement     ROS Per HPI      Objective:    BP 130/86 (BP Location: Left Arm, Patient Position: Sitting)   Pulse 73   Temp 98.5 F (36.9 C) (Temporal)   Ht 6' 1 (1.854 m)   Wt 237 lb 12.8 oz (107.9 kg)   SpO2 94%   BMI 31.37 kg/m    Physical Exam Vitals and nursing note reviewed.  Constitutional:      General: He is not in acute distress.    Appearance: He is ill-appearing.  HENT:     Head: Normocephalic and atraumatic.     Right Ear: External ear normal.     Left Ear: External ear normal.     Nose: Congestion present.     Mouth/Throat:     Mouth: Mucous membranes are moist.     Comments: Oropharyngeal cobblestoning   Eyes:     Extraocular Movements: Extraocular movements intact.   Cardiovascular:     Rate and Rhythm: Normal rate and regular rhythm.     Pulses: Normal pulses.     Heart sounds: Normal heart sounds.  Pulmonary:     Effort: Pulmonary effort is normal. No respiratory distress.     Breath sounds: Normal breath sounds. No wheezing, rhonchi or rales.  Musculoskeletal:        General: Normal range of motion.     Cervical back: Normal range of motion.     Right lower leg: No edema.     Left lower leg: No edema.  Lymphadenopathy:     Cervical: Cervical adenopathy present.  Skin:    General: Skin is warm and dry.  Neurological:     General: No focal deficit present.     Mental Status: He is alert and oriented to person, place, and time.  Psychiatric:        Mood and Affect: Mood normal.        Behavior: Behavior normal.     No results found for any visits on 04/22/24.      Assessment & Plan:   Assessment and Plan Assessment & Plan Lower respiratory tract infection, shortness of breath, subacute cough, bronchospasm (bronchitis vs pneumonia vs recurrent COVID-19) with cough, shortness of breath, and chest discomfort Persistent cough, chest discomfort, and shortness of breath with worsening symptoms. Differential includes bronchitis, pneumonia, or recurrent  COVID-19. Decreased air movement noted. - Order chest X-ray to evaluate for bronchitis or pneumonia. - Perform COVID-19 test to assess for recurrent infection.     Orders Placed This Encounter  Procedures   DG Chest 2 View    Standing Status:   Future    Number of Occurrences:   1    Expiration Date:   10/21/2024    Reason for Exam (SYMPTOM  OR DIAGNOSIS REQUIRED):   SOB, cough, recent COVID    Preferred imaging location?:   [facility] ordered this encounter  Medications   Albuterol-Budesonide (AIRSUPRA) 90-80 MCG/ACT AERO    Sig: Inhale 2 puffs into the lungs every 4 (four) hours as needed (wheezing).    Dispense:  5.9 g    Refill:  0   azithromycin (ZITHROMAX)  250 MG tablet    Sig: Take 2 tablets on day 1, then 1 tablet daily on days 2 through 5    Dispense:  6 tablet    Refill:  0    Return if symptoms worsen or fail to improve.  Corean LITTIE Ku, FNP

## 2024-04-22 NOTE — Telephone Encounter (Signed)
 Spoke with patient, scheduled for a F/U today

## 2024-04-22 NOTE — Patient Instructions (Addendum)
 We are getting an xray today. We will be in contact with any abnormal results that require further attention.  Your COVID test was positive again. I think that you do have rebound COVID.   I have sent in azithromycin for you to take.  Take 2 tablets today, then 1 tablet daily for the next 4 days.  I have sent in airsupra for you to use 2 puffs every 4 hours as needed for shortness of breath, cough and wheezing.   Follow-up with me for new or worsening symptoms.

## 2024-04-23 DIAGNOSIS — C61 Malignant neoplasm of prostate: Secondary | ICD-10-CM | POA: Diagnosis not present

## 2024-04-24 ENCOUNTER — Ambulatory Visit: Payer: Self-pay | Admitting: Family Medicine

## 2024-04-24 NOTE — Progress Notes (Signed)
 Discussed in OV

## 2024-04-28 DIAGNOSIS — J9801 Acute bronchospasm: Secondary | ICD-10-CM | POA: Insufficient documentation

## 2024-04-28 DIAGNOSIS — J22 Unspecified acute lower respiratory infection: Secondary | ICD-10-CM | POA: Insufficient documentation

## 2024-04-28 DIAGNOSIS — J40 Bronchitis, not specified as acute or chronic: Secondary | ICD-10-CM | POA: Insufficient documentation

## 2024-04-30 DIAGNOSIS — C61 Malignant neoplasm of prostate: Secondary | ICD-10-CM | POA: Diagnosis not present

## 2024-04-30 DIAGNOSIS — R399 Unspecified symptoms and signs involving the genitourinary system: Secondary | ICD-10-CM | POA: Diagnosis not present

## 2024-05-20 ENCOUNTER — Telehealth: Payer: Self-pay

## 2024-05-20 NOTE — Telephone Encounter (Addendum)
 Called and informed the patient his cath scheduled on 06/19/2024 now has an arrival time of 1100 for 1300 procedure. He was grateful for call and agreed with plan.  New instruction letter made for patient and is in letters tab.  The patient will get BMET, CBC at scheduled visit with Izetta Hummer on 11/24 and will NOT need labs on 12/1 (as indicated on AVS ib 03/05/2024 consult note).

## 2024-05-26 ENCOUNTER — Ambulatory Visit (INDEPENDENT_AMBULATORY_CARE_PROVIDER_SITE_OTHER): Admitting: Family Medicine

## 2024-05-26 ENCOUNTER — Ambulatory Visit (INDEPENDENT_AMBULATORY_CARE_PROVIDER_SITE_OTHER)

## 2024-05-26 ENCOUNTER — Encounter: Payer: Self-pay | Admitting: Family Medicine

## 2024-05-26 VITALS — BP 150/84 | HR 102 | Temp 98.6°F | Ht 73.0 in | Wt 235.0 lb

## 2024-05-26 DIAGNOSIS — G9339 Other post infection and related fatigue syndromes: Secondary | ICD-10-CM

## 2024-05-26 DIAGNOSIS — R052 Subacute cough: Secondary | ICD-10-CM | POA: Diagnosis not present

## 2024-05-26 DIAGNOSIS — R0602 Shortness of breath: Secondary | ICD-10-CM

## 2024-05-26 DIAGNOSIS — K219 Gastro-esophageal reflux disease without esophagitis: Secondary | ICD-10-CM | POA: Diagnosis not present

## 2024-05-26 DIAGNOSIS — R059 Cough, unspecified: Secondary | ICD-10-CM | POA: Diagnosis not present

## 2024-05-26 DIAGNOSIS — R011 Cardiac murmur, unspecified: Secondary | ICD-10-CM

## 2024-05-26 DIAGNOSIS — R5383 Other fatigue: Secondary | ICD-10-CM | POA: Diagnosis not present

## 2024-05-26 LAB — CBC WITH DIFFERENTIAL/PLATELET
Basophils Absolute: 0.1 K/uL (ref 0.0–0.1)
Basophils Relative: 0.7 % (ref 0.0–3.0)
Eosinophils Absolute: 0.1 K/uL (ref 0.0–0.7)
Eosinophils Relative: 1.1 % (ref 0.0–5.0)
HCT: 48.1 % (ref 39.0–52.0)
Hemoglobin: 16.2 g/dL (ref 13.0–17.0)
Lymphocytes Relative: 27.4 % (ref 12.0–46.0)
Lymphs Abs: 2.1 K/uL (ref 0.7–4.0)
MCHC: 33.7 g/dL (ref 30.0–36.0)
MCV: 89.1 fl (ref 78.0–100.0)
Monocytes Absolute: 0.7 K/uL (ref 0.1–1.0)
Monocytes Relative: 9.3 % (ref 3.0–12.0)
Neutro Abs: 4.7 K/uL (ref 1.4–7.7)
Neutrophils Relative %: 61.5 % (ref 43.0–77.0)
Platelets: 194 K/uL (ref 150.0–400.0)
RBC: 5.4 Mil/uL (ref 4.22–5.81)
RDW: 14.2 % (ref 11.5–15.5)
WBC: 7.6 K/uL (ref 4.0–10.5)

## 2024-05-26 LAB — COMPREHENSIVE METABOLIC PANEL WITH GFR
ALT: 17 U/L (ref 0–53)
AST: 18 U/L (ref 0–37)
Albumin: 4.6 g/dL (ref 3.5–5.2)
Alkaline Phosphatase: 95 U/L (ref 39–117)
BUN: 23 mg/dL (ref 6–23)
CO2: 24 meq/L (ref 19–32)
Calcium: 9.8 mg/dL (ref 8.4–10.5)
Chloride: 105 meq/L (ref 96–112)
Creatinine, Ser: 0.86 mg/dL (ref 0.40–1.50)
GFR: 82.99 mL/min (ref >60.00)
Glucose, Bld: 113 mg/dL — ABNORMAL HIGH (ref 70–99)
Potassium: 4.3 meq/L (ref 3.5–5.1)
Sodium: 141 meq/L (ref 135–145)
Total Bilirubin: 0.9 mg/dL (ref 0.2–1.2)
Total Protein: 7.6 g/dL (ref 6.0–8.3)

## 2024-05-26 LAB — D-DIMER, QUANTITATIVE: D-Dimer, Quant: 0.59 ug{FEU}/mL — ABNORMAL HIGH (ref <0.50)

## 2024-05-26 LAB — TROPONIN I (HIGH SENSITIVITY): High Sens Troponin I: 7 ng/L (ref 2–17)

## 2024-05-26 LAB — C-REACTIVE PROTEIN: CRP: <0.5 mg/dL (ref 0.5–20.0)

## 2024-05-26 LAB — SEDIMENTATION RATE: Sed Rate: 17 mm/h (ref 0–20)

## 2024-05-26 NOTE — Progress Notes (Signed)
 Acute Office Visit  Subjective:     Patient ID: [name], male    DOB: July 13, 1946, 78 y.o.   MRN: [id number]  Chief Complaint  Patient presents with   Nasal Congestion    HPI  Discussed the use of AI scribe software for clinical note transcription with the patient, who gave verbal consent to proceed.  History of Present Illness [name] is a 78 year old male who presents with respiratory wheezing, fatigue, and gastrointestinal symptoms.  Respiratory symptoms - Persistent wheezing, particularly noted while watching television - Uncontrollable cough, primarily in the morning and evening - Robitussin taken without relief - No fevers or chills, but sometimes feels febrile  Fatigue - Severe fatigue and tiredness - Some days described as overwhelming  Gastrointestinal symptoms - Constantly unsettled stomach - Frequent use of Tums without relief - Suspects possible gastroesophageal reflux disease (GERD)  Cardiac history - History of heart murmur diagnosed 10-15 years ago - Scheduled for echocardiogram and heart catheterization     ROS Per HPI      Objective:    BP (!) 150/84 (BP Location: Left Arm, Patient Position: Sitting)   Pulse (!) 102   Temp 98.6 F (37 C) (Temporal)   Ht 6' 1 (1.854 m)   Wt 235 lb (106.6 kg)   SpO2 100%   BMI 31.00 kg/m    Physical Exam Vitals and nursing note reviewed.  Constitutional:      General: He is not in acute distress.    Appearance: Normal appearance.     Comments: Appears fatigued  HENT:     Head: Normocephalic and atraumatic.     Right Ear: External ear normal.     Left Ear: External ear normal.     Nose: Nose normal.     Mouth/Throat:     Mouth: Mucous membranes are moist.     Pharynx: Oropharynx is clear.  Eyes:     Extraocular Movements: Extraocular movements intact.  Cardiovascular:     Rate and Rhythm: Normal rate and regular rhythm.     Pulses: Normal pulses.     Heart sounds:  Murmur heard.  Pulmonary:     Effort: Pulmonary effort is normal. No respiratory distress.     Breath sounds: Normal breath sounds. No wheezing, rhonchi or rales.  Musculoskeletal:        General: Normal range of motion.     Cervical back: Normal range of motion.     Right lower leg: No edema.     Left lower leg: No edema.  Lymphadenopathy:     Cervical: No cervical adenopathy.  Skin:    General: Skin is warm and dry.  Neurological:     General: No focal deficit present.     Mental Status: He is alert and oriented to person, place, and time.  Psychiatric:        Mood and Affect: Mood normal.        Behavior: Behavior normal.     EKG: Reviewed and interpreted by me Indication: SOB, fatigue, HTN Rate: 88 Interpretation: Sinus arrhythmia with 1st degree AV block Changes from previous: none significant  Results for orders placed or performed in visit on 05/26/24  CBC with Differential/Platelet  Result Value Ref Range   WBC 7.6 4.0 - 10.5 K/uL   RBC 5.40 4.22 - 5.81 Mil/uL   Hemoglobin 16.2 13.0 - 17.0 g/dL   HCT 51.8 60.9 - 47.9 %   MCV 89.1 78.0 - 100.0  fl   MCHC 33.7 30.0 - 36.0 g/dL   RDW 85.7 88.4 - 84.4 %   Platelets 194.0 150.0 - 400.0 K/uL   Neutrophils Relative % 61.5 43.0 - 77.0 %   Lymphocytes Relative 27.4 12.0 - 46.0 %   Monocytes Relative 9.3 3.0 - 12.0 %   Eosinophils Relative 1.1 0.0 - 5.0 %   Basophils Relative 0.7 0.0 - 3.0 %   Neutro Abs 4.7 1.4 - 7.7 K/uL   Lymphs Abs 2.1 0.7 - 4.0 K/uL   Monocytes Absolute 0.7 0.1 - 1.0 K/uL   Eosinophils Absolute 0.1 0.0 - 0.7 K/uL   Basophils Absolute 0.1 0.0 - 0.1 K/uL  Comprehensive metabolic panel with GFR  Result Value Ref Range   Sodium 141 135 - 145 mEq/L   Potassium 4.3 3.5 - 5.1 mEq/L   Chloride 105 96 - 112 mEq/L   CO2 24 19 - 32 mEq/L   Glucose, Bld 113 (H) 70 - 99 mg/dL   BUN 23 6 - 23 mg/dL   Creatinine, Ser 9.13 0.40 - 1.50 mg/dL   Total Bilirubin 0.9 0.2 - 1.2 mg/dL   Alkaline Phosphatase 95  39 - 117 U/L   AST 18 0 - 37 U/L   ALT 17 0 - 53 U/L   Total Protein 7.6 6.0 - 8.3 g/dL   Albumin 4.6 3.5 - 5.2 g/dL   GFR 17.00 >39.99 mL/min   Calcium 9.8 8.4 - 10.5 mg/dL  C-reactive protein  Result Value Ref Range   CRP <0.5 0.5 - 20.0 mg/dL  D-dimer, quantitative  Result Value Ref Range   D-Dimer, Quant 0.59 (H) <0.50 mcg/mL FEU  Sedimentation rate  Result Value Ref Range   Sed Rate 17 0 - 20 mm/hr  Troponin I (High Sensitivity)  Result Value Ref Range   High Sens Troponin I 7 2 - 17 ng/L        Assessment & Plan:   Assessment and Plan Assessment & Plan Fatigue and shortness of breath following infection  post-COVID-19 with fatigue, tachycardia, and BP changes. Louder heart murmur suggests possible pericardial effusion. - Ordered EKG. - Ordered chest X-ray. - Ordered laboratory tests. - Monitor symptoms with cardiac evaluation.  Heart murmur Long-standing murmur, louder than before, possibly linked to pericarditis or cardiac issues. - Monitor murmur with cardiac evaluation.  Gastroesophageal reflux symptoms Symptoms consistent with GERD, managed with Tums. - Discussed the use of PPIs as a more favorable option  Subacute Cough Persistent cough, possibly related to GERD or respiratory issues, not relieved by Robitussin.     Orders Placed This Encounter  Procedures   DG Chest 2 View    Standing Status:   Future    Number of Occurrences:   1    Expiration Date:   11/23/2024    Reason for Exam (SYMPTOM  OR DIAGNOSIS REQUIRED):   SOBOE, fatigue, cough    Preferred imaging location?:   [facility]   CBC with Differential/Platelet    Release to patient:   Immediate [1]   Comprehensive metabolic panel with GFR    Release to patient:   Immediate [1]   C-reactive protein   D-dimer, quantitative    Release to patient:   Immediate [1]   Sedimentation rate   EKG 12-Lead     No orders of the defined types were placed in this encounter.   Return if  symptoms worsen or fail to improve.  Jack LITTIE Ku, FNP

## 2024-05-27 ENCOUNTER — Ambulatory Visit: Payer: Self-pay | Admitting: Family Medicine

## 2024-06-08 NOTE — Patient Instructions (Addendum)
 EKG shows no acute changes.   We are getting an xray today. We will be in contact with any abnormal results that require further attention.  We are checking labs today, will be in contact with any results that require further attention  Follow up with cardiology as scheduled.   Follow-up with me for new or worsening symptoms.

## 2024-06-09 ENCOUNTER — Ambulatory Visit: Attending: Physician Assistant | Admitting: Physician Assistant

## 2024-06-09 ENCOUNTER — Ambulatory Visit (HOSPITAL_COMMUNITY)
Admission: RE | Admit: 2024-06-09 | Discharge: 2024-06-09 | Disposition: A | Discharge status: Home / Self Care | Source: Ambulatory Visit | Attending: Cardiovascular Disease | Admitting: Cardiovascular Disease

## 2024-06-09 VITALS — BP 130/82 | HR 74 | Ht 73.0 in | Wt 234.8 lb

## 2024-06-09 DIAGNOSIS — I350 Nonrheumatic aortic (valve) stenosis: Secondary | ICD-10-CM | POA: Diagnosis not present

## 2024-06-09 LAB — ECHOCARDIOGRAM COMPLETE
AR max vel: 1.15 cm2
AV Area VTI: 0.98 cm2
AV Area mean vel: 1.04 cm2
AV Mean grad: 36 mmHg
AV Peak grad: 59.3 mmHg
Ao pk vel: 3.85 m/s
S' Lateral: 2.3 cm

## 2024-06-09 NOTE — Progress Notes (Signed)
 Pre Surgical Assessment: 5 M Walk Test  52M=16.27ft  5 Meter Walk Test- trial 1: 4.64 seconds 5 Meter Walk Test- trial 2: 5.20 seconds 5 Meter Walk Test- trial 3: 5.19 seconds 5 Meter Walk Test Average: 5.01 seconds

## 2024-06-09 NOTE — Progress Notes (Signed)
 [facility]   MULTIDISCIPLINARY HEART VALVE CLINIC                                     Cardiology Office Note:    Date:  06/09/2024   ID:  [name] Bo, DOB [birth year], MRN [id number]  PCP:  [name], FNP  [facility] Cardiologist:  Ozell Fell, Bryant  [facility] Structural heart: Ozell Fell, Bryant [facility] Electrophysiologist:  None   Referring Bryant: Jason Leita Repine,*   Pre cath visit   History of Present Illness:    [name] is a 78 y.o. 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   He has been followed for aortic stenosis now for several years. His most recent echo in March 2025 showed normal LVEF 60 to 65%, normal RV function, trivial MR, and moderately severe aortic stenosis with mean gradient of 30 mmHg, V-max of 3.6 m/s, AVA 0.8, DVI 0.29, SVI 26. He reported exertional dyspnea with walking uphill or at a brisk pace. He was seen in the office on 03/05/24 for structural heart evaluation and TAVR work up started. Cath scheduled 06/19/24.  He recently had Covid and seen by PCP where he complained of fatigue and SOB. Heart murmur noted to be louder. Inflammatory markers, troponin, CBC and BMET normal.   Today the patient presents to clinic for follow up. He complains of chest pain with exertion and better with ibuprofen. He is mostly limited by dyspnea on exertion and fatigue. He has some exertional dizziness but no syncope. No LE edema, orthopnea or PND.    Past Medical History:  Diagnosis Date   Allergy 1952   Arthritis    ARTHRITIS 02/25/2008   Qualifier: Diagnosis of  By: [name], [name]     DEGENERATIVE JOINT DISEASE, BOTH KNEES, SEVERE 02/17/2010   Qualifier: Diagnosis of  By: [name]     Diverticulosis of large intestine 01/07/2009   Qualifier: Diagnosis of   By: [name], [name]   Colonoscopy 2009;Dr  Kaplan,[facility]     Erectile dysfunction    Esophagitis 07/26/2007   Qualifier: Diagnosis of  By: [name], [name]     Essential hypertension 01/07/2009   Qualifier: Diagnosis of  By: [name], [name] Peels    OD only; from remote trauma (open angle)   GLAUCOMA, PRIMARY OPEN-ANGLE 02/25/2008   Qualifier: Diagnosis of  By: [name]   OD only;Dr [name], [facility]     Heart murmur    Hyperglycemia 01/07/2009   Qualifier: Diagnosis of  By: [name], [name]   No FH DM    Hyperlipidemia    HYPERLIPIDEMIA 07/26/2007   Qualifier: Diagnosis of  By: [name], [name] SINE Lipoprofile 2010: LDL 93 (1800/ 1237), HDL 38, TG 153.  Minimal LDL goal = < 100, preferred goal = < 70. Mother MI @ 62. Father CVA @ 74     Hypertension    Prostate cancer (HCC) 10/16/2012   Adenocarcinoma     Current Medications: Current Meds  Medication Sig   Albuterol-Budesonide (AIRSUPRA ) 90-80 MCG/ACT AERO Inhale 2 puffs into the lungs every 4 (four) hours as needed (wheezing).   aspirin 81 MG tablet Take 81  mg by mouth daily.   simvastatin  (ZOCOR ) 20 MG tablet Take 1 tablet (20 mg total) by mouth at bedtime.   telmisartan  (MICARDIS ) 80 MG tablet TAKE 1 TABLET BY MOUTH EVERY DAY   timolol  (TIMOPTIC ) 0.5 % ophthalmic solution Place 1 drop into the right eye 2 (two) times daily.      ROS:   Please see the history of present illness.    All other systems reviewed and are negative.  EKGs   EKG Interpretation Date/Time:  Monday June 09 2024 09:23:48 EST Ventricular Rate:  74 PR Interval:  248 QRS Duration:  74 QT Interval:  374 QTC Calculation: 415 R Axis:   -45  Text Interpretation: Sinus rhythm with sinus arrhythmia with 1st degree A-V block Left anterior fascicular block Anterior infarct , age undetermined Confirmed by Sebastian Collar [phone]) on 06/09/2024 9:56:00 AM   Risk Assessment/Calculations:         Physical  Exam:    VS:  BP 130/82   Pulse 74   Ht 6' 1 (1.854 m)   Wt 234 lb 12.8 oz (106.5 kg)   SpO2 96%   BMI 30.98 kg/m     Wt Readings from Last 3 Encounters:  06/09/24 234 lb 12.8 oz (106.5 kg)  05/26/24 235 lb (106.6 kg)  04/22/24 237 lb 12.8 oz (107.9 kg)     GEN: Well nourished, well developed in no acute distress NECK: No JVD CARDIAC: RRR, 3/6 harsh SEM @ RUSB with radiation to the left clavicle. No rubs, gallops RESPIRATORY:  Clear to auscultation without rales, wheezing or rhonchi  ABDOMEN: Soft, non-tender, non-distended EXTREMITIES:  No edema; No deformity.    ASSESSMENT:    1. Nonrheumatic aortic valve stenosis     PLAN:    In order of problems listed above:  Severe AS: -- In TAVR work up.  -- Labs 05/26/24 showed creat 0.86 and Hg/Plt 16/194 (personally reviewed).  -- He has NYHA class II symptoms.  -- With 1st deg AV block/LAFB, may have increased risk for HAVB.       Informed Consent   Shared Decision Making/Informed Consent The risks [stroke (1 in 1000), death (1 in 1000), kidney failure [usually temporary] (1 in 500), bleeding (1 in 200), allergic reaction [possibly serious] (1 in 200)], benefits (diagnostic support and management of coronary artery disease) and alternatives of a cardiac catheterization were discussed in detail with [name] and he is willing to proceed.        Medication Adjustments/Labs and Tests Ordered: Current medicines are reviewed at length with the patient today.  Concerns regarding medicines are outlined above.  Orders Placed This Encounter  Procedures   EKG 12-Lead   No orders of the defined types were placed in this encounter.   Patient Instructions  Medication Instructions:  Your physician recommends that you continue on your current medications as directed. Please refer to the Current Medication list given to you today.  *If you need a refill on your cardiac medications before your next appointment, please call  your pharmacy*  Lab Work: None needed If you have labs (blood work) drawn today and your tests are completely normal, you will receive your results only by: MyChart Message (if you have MyChart) OR A paper copy in the mail If you have any lab test that is abnormal or we need to change your treatment, we will call you to review the results.  Testing/Procedures: You are scheduled for a Cardiac Catheterization on Thursday, December 4  with Dr. Ozell Fell.  Instruction letter given today in office.    Follow-Up: At [facility], you and your health needs are our priority.  As part of our continuing mission to provide you with exceptional heart care, our providers are all part of one team.  This team includes your primary Cardiologist (physician) and Advanced Practice Providers or APPs (Physician Assistants and Nurse Practitioners) who all work together to provide you with the care you need, when you need it.   We recommend signing up for the patient portal called MyChart.  Sign up information is provided on this After Visit Summary.  MyChart is used to connect with patients for Virtual Visits (Telemedicine).  Patients are able to view lab/test results, encounter notes, upcoming appointments, etc.  Non-urgent messages can be sent to your provider as well.   To learn more about what you can do with MyChart, go to [email or web address].                 Signed, Lamarr Hummer, PA-C  06/09/2024 12:56 PM    [facility]

## 2024-06-09 NOTE — H&P (View-Only) (Signed)
 [facility]ASC[facility]ENTER   MULTIDISCIPLINARY HEART VALVE CLINIC                                     Cardiology Office Note:    Date:  06/09/2024   ID:  [name], DOB [birth year], MRN [id number]  PCP:  [name], FNP  [facility] Cardiologist:  Ozell Fell, MD  [facility] Structural heart: Ozell Fell, MD [facility] Electrophysiologist:  None   Referring MD: Jason Leita Repine,*   Pre cath visit   History of Present Illness:    [name] is a 78 y.o. 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   He has been followed for aortic stenosis now for several years. His most recent echo in March 2025 showed normal LVEF 60 to 65%, normal RV function, trivial MR, and moderately severe aortic stenosis with mean gradient of 30 mmHg, V-max of 3.6 m/s, AVA 0.8, DVI 0.29, SVI 26. He reported exertional dyspnea with walking uphill or at a brisk pace. He was seen in the office on 03/05/24 for structural heart evaluation and TAVR work up started. Cath scheduled 06/19/24.  He recently had Covid and seen by PCP where he complained of fatigue and SOB. Heart murmur noted to be louder. Inflammatory markers, troponin, CBC and BMET normal.   Today the patient presents to clinic for follow up. He complains of chest pain with exertion and better with ibuprofen. He is mostly limited by dyspnea on exertion and fatigue. He has some exertional dizziness but no syncope. No LE edema, orthopnea or PND.    Past Medical History:  Diagnosis Date   Allergy 1952   Arthritis    ARTHRITIS 02/25/2008   Qualifier: Diagnosis of  By: Claudene BANANA, Heron     DEGENERATIVE JOINT DISEASE, BOTH KNEES, SEVERE 02/17/2010   Qualifier: Diagnosis of  By: Caprice Leader     Diverticulosis of large intestine 01/07/2009   Qualifier: Diagnosis of   By: Tish MD, William   Colonoscopy 2009;Dr  Kaplan,[facility]     Erectile dysfunction    Esophagitis 07/26/2007   Qualifier: Diagnosis of  By: Tish MD, Elsie     Essential hypertension 01/07/2009   Qualifier: Diagnosis of  By: Tish MD, Elsie Peels    OD only; from remote trauma (open angle)   GLAUCOMA, PRIMARY OPEN-ANGLE 02/25/2008   Qualifier: Diagnosis of  By: Claudene BANANA Heron   OD only;Dr Thresa Bracket, [facility]     Heart murmur    Hyperglycemia 01/07/2009   Qualifier: Diagnosis of  By: Tish MD, Elsie   No FH DM    Hyperlipidemia    HYPERLIPIDEMIA 07/26/2007   Qualifier: Diagnosis of  By: Tish MD, Elsie SINE Lipoprofile 2010: LDL 93 (1800/ 1237), HDL 38, TG 153.  Minimal LDL goal = < 100, preferred goal = < 70. Mother MI @ 62. Father CVA @ 74     Hypertension    Prostate cancer (HCC) 10/16/2012   Adenocarcinoma     Current Medications: Current Meds  Medication Sig   Albuterol-Budesonide (AIRSUPRA ) 90-80 MCG/ACT AERO Inhale 2 puffs into the lungs every 4 (four) hours as needed (wheezing).   aspirin 81 MG tablet Take 81  mg by mouth daily.   simvastatin  (ZOCOR ) 20 MG tablet Take 1 tablet (20 mg total) by mouth at bedtime.   telmisartan  (MICARDIS ) 80 MG tablet TAKE 1 TABLET BY MOUTH EVERY DAY   timolol  (TIMOPTIC ) 0.5 % ophthalmic solution Place 1 drop into the right eye 2 (two) times daily.      ROS:   Please see the history of present illness.    All other systems reviewed and are negative.  EKGs   EKG Interpretation Date/Time:  Monday June 09 2024 09:23:48 EST Ventricular Rate:  74 PR Interval:  248 QRS Duration:  74 QT Interval:  374 QTC Calculation: 415 R Axis:   -45  Text Interpretation: Sinus rhythm with sinus arrhythmia with 1st degree A-V block Left anterior fascicular block Anterior infarct , age undetermined Confirmed by Sebastian Collar [phone]) on 06/09/2024 9:56:00 AM   Risk Assessment/Calculations:         Physical  Exam:    VS:  BP 130/82   Pulse 74   Ht 6' 1 (1.854 m)   Wt 234 lb 12.8 oz (106.5 kg)   SpO2 96%   BMI 30.98 kg/m     Wt Readings from Last 3 Encounters:  06/09/24 234 lb 12.8 oz (106.5 kg)  05/26/24 235 lb (106.6 kg)  04/22/24 237 lb 12.8 oz (107.9 kg)     GEN: Well nourished, well developed in no acute distress NECK: No JVD CARDIAC: RRR, 3/6 harsh SEM @ RUSB with radiation to the left clavicle. No rubs, gallops RESPIRATORY:  Clear to auscultation without rales, wheezing or rhonchi  ABDOMEN: Soft, non-tender, non-distended EXTREMITIES:  No edema; No deformity.    ASSESSMENT:    1. Nonrheumatic aortic valve stenosis     PLAN:    In order of problems listed above:  Severe AS: -- In TAVR work up.  -- Labs 05/26/24 showed creat 0.86 and Hg/Plt 16/194 (personally reviewed).  -- He has NYHA class II symptoms.  -- With 1st deg AV block/LAFB, may have increased risk for HAVB.       Informed Consent   Shared Decision Making/Informed Consent The risks [stroke (1 in 1000), death (1 in 1000), kidney failure [usually temporary] (1 in 500), bleeding (1 in 200), allergic reaction [possibly serious] (1 in 200)], benefits (diagnostic support and management of coronary artery disease) and alternatives of a cardiac catheterization were discussed in detail with [name] and he is willing to proceed.        Medication Adjustments/Labs and Tests Ordered: Current medicines are reviewed at length with the patient today.  Concerns regarding medicines are outlined above.  Orders Placed This Encounter  Procedures   EKG 12-Lead   No orders of the defined types were placed in this encounter.   Patient Instructions  Medication Instructions:  Your physician recommends that you continue on your current medications as directed. Please refer to the Current Medication list given to you today.  *If you need a refill on your cardiac medications before your next appointment, please call  your pharmacy*  Lab Work: None needed If you have labs (blood work) drawn today and your tests are completely normal, you will receive your results only by: MyChart Message (if you have MyChart) OR A paper copy in the mail If you have any lab test that is abnormal or we need to change your treatment, we will call you to review the results.  Testing/Procedures: You are scheduled for a Cardiac Catheterization on Thursday, December 4  with Dr. Ozell Fell.  Instruction letter given today in office.    Follow-Up: At [facility], you and your health needs are our priority.  As part of our continuing mission to provide you with exceptional heart care, our providers are all part of one team.  This team includes your primary Cardiologist (physician) and Advanced Practice Providers or APPs (Physician Assistants and Nurse Practitioners) who all work together to provide you with the care you need, when you need it.   We recommend signing up for the patient portal called MyChart.  Sign up information is provided on this After Visit Summary.  MyChart is used to connect with patients for Virtual Visits (Telemedicine).  Patients are able to view lab/test results, encounter notes, upcoming appointments, etc.  Non-urgent messages can be sent to your provider as well.   To learn more about what you can do with MyChart, go to [email or web address].                 Signed, Lamarr Hummer, PA-C  06/09/2024 12:56 PM    [facility]

## 2024-06-09 NOTE — Patient Instructions (Signed)
 Medication Instructions:  Your physician recommends that you continue on your current medications as directed. Please refer to the Current Medication list given to you today.  *If you need a refill on your cardiac medications before your next appointment, please call your pharmacy*  Lab Work: None needed If you have labs (blood work) drawn today and your tests are completely normal, you will receive your results only by: MyChart Message (if you have MyChart) OR A paper copy in the mail If you have any lab test that is abnormal or we need to change your treatment, we will call you to review the results.  Testing/Procedures: You are scheduled for a Cardiac Catheterization on Thursday, December 4 with Dr. Ozell Fell.  Instruction letter given today in office.    Follow-Up: At [facility], you and your health needs are our priority.  As part of our continuing mission to provide you with exceptional heart care, our providers are all part of one team.  This team includes your primary Cardiologist (physician) and Advanced Practice Providers or APPs (Physician Assistants and Nurse Practitioners) who all work together to provide you with the care you need, when you need it.   We recommend signing up for the patient portal called MyChart.  Sign up information is provided on this After Visit Summary.  MyChart is used to connect with patients for Virtual Visits (Telemedicine).  Patients are able to view lab/test results, encounter notes, upcoming appointments, etc.  Non-urgent messages can be sent to your provider as well.   To learn more about what you can do with MyChart, go to [email or web address].

## 2024-06-10 ENCOUNTER — Ambulatory Visit: Payer: Self-pay

## 2024-06-17 ENCOUNTER — Telehealth: Payer: Self-pay | Admitting: *Deleted

## 2024-06-17 NOTE — Telephone Encounter (Addendum)
 Cardiac Catheterization scheduled at [facility] for: Thursday June 19, 2024 12:30 PM-time change per cath lab schedule. Arrival time [facility] Main Entrance A at: 10:30 AM  Diet: -May have light meal until 6:30 AM. (6 hours before procedure time) Approved light meal consists of plain toast, fruit, light soups, crackers.  Hydration: -May drink clear liquids until 2 hours before the procedure.  Approved liquids: Water , clear tea, black coffee, fruit juices-non-citric and without pulp,Gatorade, plain Jello/popsicles.   -Please drink 16  oz of water  2 hours before procedure.  Medication instructions: -Usual morning medications can be taken including aspirin 81 mg.  Plan to go home the same day, you will only stay overnight if medically necessary.  You must have responsible adult to drive you home.  Someone must be with you the first 24 hours after you arrive home.  Reviewed procedure instructions with patient.

## 2024-06-19 ENCOUNTER — Other Ambulatory Visit: Payer: Self-pay

## 2024-06-19 ENCOUNTER — Encounter (HOSPITAL_COMMUNITY)
Admission: RE | Disposition: A | Discharge status: Home / Self Care | Payer: Self-pay | Source: Home / Self Care | Attending: Cardiovascular Disease

## 2024-06-19 ENCOUNTER — Ambulatory Visit (HOSPITAL_COMMUNITY)
Admission: RE | Admit: 2024-06-19 | Discharge: 2024-06-19 | Disposition: A | Discharge status: Home / Self Care | Attending: Cardiovascular Disease | Admitting: Cardiovascular Disease

## 2024-06-19 DIAGNOSIS — Z79899 Other long term (current) drug therapy: Secondary | ICD-10-CM | POA: Diagnosis not present

## 2024-06-19 DIAGNOSIS — Z7982 Long term (current) use of aspirin: Secondary | ICD-10-CM | POA: Diagnosis not present

## 2024-06-19 DIAGNOSIS — I440 Atrioventricular block, first degree: Secondary | ICD-10-CM | POA: Diagnosis not present

## 2024-06-19 DIAGNOSIS — I350 Nonrheumatic aortic (valve) stenosis: Secondary | ICD-10-CM

## 2024-06-19 DIAGNOSIS — I2510 Atherosclerotic heart disease of native coronary artery without angina pectoris: Secondary | ICD-10-CM | POA: Diagnosis not present

## 2024-06-19 DIAGNOSIS — Z8546 Personal history of malignant neoplasm of prostate: Secondary | ICD-10-CM | POA: Diagnosis not present

## 2024-06-19 HISTORY — PX: LEFT HEART CATH AND CORONARY ANGIOGRAPHY: CATH118249

## 2024-06-19 MED ORDER — FREE WATER: 500.0000 mL | Freq: Once | Status: DC

## 2024-06-19 MED ORDER — HEPARIN SODIUM (PORCINE) 1000 UNIT/ML IJ SOLN
INTRAMUSCULAR | Status: DC | PRN
  Administered 2024-06-19: 5000 [IU] via INTRAVENOUS

## 2024-06-19 MED ORDER — LIDOCAINE HCL (PF) 1 % IJ SOLN
INTRAMUSCULAR | Status: AC
  Filled 2024-06-19: qty 30

## 2024-06-19 MED ORDER — SODIUM CHLORIDE 0.9 % IV SOLN: 250.0000 mL | INTRAVENOUS | Status: DC | PRN

## 2024-06-19 MED ORDER — IOHEXOL 350 MG/ML SOLN
INTRAVENOUS | Status: DC | PRN
  Administered 2024-06-19: 50 mL

## 2024-06-19 MED ORDER — SODIUM CHLORIDE 0.9% FLUSH: 3.0000 mL | Freq: Two times a day (BID) | INTRAVENOUS | Status: DC

## 2024-06-19 MED ORDER — MIDAZOLAM HCL 2 MG/2ML IJ SOLN
INTRAMUSCULAR | Status: AC
  Filled 2024-06-19: qty 2

## 2024-06-19 MED ORDER — VERAPAMIL HCL 2.5 MG/ML IV SOLN
INTRAVENOUS | Status: AC
  Filled 2024-06-19: qty 2

## 2024-06-19 MED ORDER — MIDAZOLAM HCL (PF) 2 MG/2ML IJ SOLN
INTRAMUSCULAR | Status: DC | PRN
  Administered 2024-06-19: 2 mg via INTRAVENOUS

## 2024-06-19 MED ORDER — SODIUM CHLORIDE 0.9% FLUSH: 3.0000 mL | INTRAVENOUS | Status: DC | PRN

## 2024-06-19 MED ORDER — ACETAMINOPHEN 325 MG PO TABS: 650.0000 mg | ORAL_TABLET | ORAL | Status: DC | PRN

## 2024-06-19 MED ORDER — ASPIRIN 81 MG PO CHEW: 81.0000 mg | CHEWABLE_TABLET | ORAL | Status: DC

## 2024-06-19 MED ORDER — FENTANYL CITRATE (PF) 100 MCG/2ML IJ SOLN
INTRAMUSCULAR | Status: DC | PRN
  Administered 2024-06-19: 25 ug via INTRAVENOUS

## 2024-06-19 MED ORDER — HEPARIN SODIUM (PORCINE) 1000 UNIT/ML IJ SOLN
INTRAMUSCULAR | Status: AC
  Filled 2024-06-19: qty 10

## 2024-06-19 MED ORDER — FENTANYL CITRATE (PF) 100 MCG/2ML IJ SOLN
INTRAMUSCULAR | Status: AC
  Filled 2024-06-19: qty 2

## 2024-06-19 MED ORDER — ONDANSETRON HCL 4 MG/2ML IJ SOLN: 4.0000 mg | Freq: Four times a day (QID) | INTRAMUSCULAR | Status: DC | PRN

## 2024-06-19 MED ORDER — LIDOCAINE HCL (PF) 1 % IJ SOLN
INTRAMUSCULAR | Status: DC | PRN
  Administered 2024-06-19: 2 mL

## 2024-06-19 SURGICAL SUPPLY — 6 items
CATH 5FR JL3.5 JR4 ANG PIG MP (CATHETERS) IMPLANT
DEVICE RAD COMP TR BAND LRG (VASCULAR PRODUCTS) IMPLANT
GLIDESHEATH SLEND SS 6F .021 (SHEATH) IMPLANT
GUIDEWIRE INQWIRE 1.5J.035X260 (WIRE) IMPLANT
PACK CARDIAC CATHETERIZATION (CUSTOM PROCEDURE TRAY) ×1 IMPLANT
SET ATX-X65L (MISCELLANEOUS) IMPLANT

## 2024-06-19 NOTE — Progress Notes (Signed)
 Patient and patient wife as well as daughter given discharge instructions, education provided no further questions at this time. Patient able to ambulate and void before discharge. Able to tolerate PO intake. Patient site is clean, dry, intact with no hematoma noted upon discharge.

## 2024-06-19 NOTE — Interval H&P Note (Signed)
 History and Physical Interval Note:  06/19/2024 12:20 PM  [name]  has presented today for surgery, with the diagnosis of tavr workup - aortic stenosis.  The various methods of treatment have been discussed with the patient and family. After consideration of risks, benefits and other options for treatment, the patient has consented to  Procedure(s): LEFT HEART CATH AND CORONARY ANGIOGRAPHY (N/A) as a surgical intervention.  The patient's history has been reviewed, patient examined, no change in status, stable for surgery.  I have reviewed the patient's chart and labs.  Questions were answered to the patient's satisfaction.     Ozell Fell

## 2024-06-20 ENCOUNTER — Encounter (HOSPITAL_COMMUNITY): Payer: Self-pay | Admitting: Cardiovascular Disease

## 2024-07-02 ENCOUNTER — Ambulatory Visit (HOSPITAL_COMMUNITY)
Admission: RE | Admit: 2024-07-02 | Discharge: 2024-07-02 | Attending: Cardiovascular Disease | Admitting: Cardiovascular Disease

## 2024-07-02 ENCOUNTER — Ambulatory Visit: Payer: Self-pay | Admitting: Cardiovascular Disease

## 2024-07-02 DIAGNOSIS — I701 Atherosclerosis of renal artery: Secondary | ICD-10-CM | POA: Diagnosis not present

## 2024-07-02 DIAGNOSIS — I350 Nonrheumatic aortic (valve) stenosis: Secondary | ICD-10-CM | POA: Insufficient documentation

## 2024-07-02 DIAGNOSIS — Z954 Presence of other heart-valve replacement: Secondary | ICD-10-CM | POA: Diagnosis not present

## 2024-07-02 DIAGNOSIS — I700 Atherosclerosis of aorta: Secondary | ICD-10-CM | POA: Diagnosis not present

## 2024-07-02 DIAGNOSIS — Z01818 Encounter for other preprocedural examination: Secondary | ICD-10-CM | POA: Diagnosis not present

## 2024-07-02 MED ORDER — IOHEXOL 350 MG/ML SOLN
100.0000 mL | Freq: Once | INTRAVENOUS | Status: AC | PRN
  Administered 2024-07-02: 10:00:00 100 mL via INTRAVENOUS

## 2024-07-08 NOTE — Progress Notes (Signed)
 STS Score Risk of Mortality: 0.910%  Renal Failure: 0.939%  Permanent Stroke: 0.950%  Prolonged Ventilation: 3.392%  DSW Infection: 0.077%  Reoperation: 4.024%  Morbidity or Mortality: 7.195%  Short Length of Stay: 52.237%  Long Length of Stay: 1.992%

## 2024-07-23 ENCOUNTER — Ambulatory Visit

## 2024-07-23 DIAGNOSIS — Z23 Encounter for immunization: Secondary | ICD-10-CM

## 2024-07-23 NOTE — Progress Notes (Addendum)
 After obtaining consent, and per orders of Dr. Corean Ku, injection of HDFLU given by Ronnald SHAUNNA Palms. Patient instructed to  report any adverse reaction to me immediately.   Medical screening examination/treatment/procedure(s) were performed by non-physician practitioner and as supervising physician I was immediately available for consultation/collaboration.  I agree with above. Karlynn Noel, MD

## 2024-07-24 ENCOUNTER — Ambulatory Visit

## 2024-07-24 VITALS — BP 180/100 | HR 80 | Resp 20 | Ht 73.0 in | Wt 232.0 lb

## 2024-07-24 DIAGNOSIS — I350 Nonrheumatic aortic (valve) stenosis: Secondary | ICD-10-CM

## 2024-07-24 NOTE — Progress Notes (Addendum)
 " [facility]   MULTIDISCIPLINARY HEART VALVE CLINIC    [name] Medical Record #[id number] Date of Birth: 28-Jan-1946  Referring: Jack Sharper, MD Primary Care: [name], FNP Primary Cardiologist:[name], MD  Chief Complaint:    Chief Complaint  Patient presents with   Aortic Stenosis    TAVR consult/ review all testing    History of Present Illness:     [name] is a 79 y.o. male presents for evaluation of severe aortic stenosis and TAVR.  He has been followed for almost 10 years for his aortic stenosis, which has gradually progressed to severe.  He has exertional dyspnea with walking up hill or walking quickly.  He was previously walking a mile 3-4 times a week, but hasn't been able to do it for the last 6 months due to fatigue and dyspnea.  He also had lightheadness, especially with bending over.  He denies palpitations or leg swelling. He is in the process of selling his home and moving into a retirement center.  He is eager to proceed with TAVR in order to improve his quality of life.  My measurements: Annulus - Area 558 mm2 Sinuses - R 33.6, L 35.8, N 33.7 Coronaries - R 14.0 mm, L 15.2 Access - Transfemoral adequate, bifurcation lower on the right, also a little less tortuous  Past Medical History:  Diagnosis Date   Allergy 1952   Arthritis    ARTHRITIS 02/25/2008   Qualifier: Diagnosis of  By: Claudene GUSS Railing     DEGENERATIVE JOINT DISEASE, BOTH KNEES, SEVERE 02/17/2010   Qualifier: Diagnosis of  By: Caprice Leader     Diverticulosis of large intestine 01/07/2009   Qualifier: Diagnosis of  By: Tish MD, William   Colonoscopy 2009;Dr  Kaplan,[facility]     Erectile dysfunction    Esophagitis 07/26/2007   Qualifier: Diagnosis of  By: Tish MD, Elsie     Essential hypertension 01/07/2009   Qualifier: Diagnosis of  By: Tish MD, Elsie Peels    OD only; from remote trauma (open angle)    GLAUCOMA, PRIMARY OPEN-ANGLE 02/25/2008   Qualifier: Diagnosis of  By: Claudene GUSS Railing   OD only;Dr Thresa Bracket, [facility] Op[facility]     Heart murmur    Hyperglycemia 01/07/2009   Qualifier: Diagnosis of  By: Tish MD, Elsie   No FH DM    Hyperlipidemia    HYPERLIPIDEMIA 07/26/2007   Qualifier: Diagnosis of  By: Tish MD, Elsie SINE Lipoprofile 2010: LDL 93 (1800/ 1237), HDL 38, TG 153.  Minimal LDL goal = < 100, preferred goal = < 70. Mother MI @ 34. Father CVA @ 27     Hypertension    Prostate cancer (HCC) 10/16/2012   Adenocarcinoma    Past Surgical History:  Procedure Laterality Date   CATARACT EXTRACTION Left 06/20/2018   COLONOSCOPY  2009   Dr Debrah; due 2019   EYE SURGERY  2022   Cataract removal-OU   JOINT REPLACEMENT  2009   L-TKR   LEFT HEART CATH AND CORONARY ANGIOGRAPHY N/A 06/19/2024   Procedure: LEFT HEART CATH AND CORONARY ANGIOGRAPHY;  Surgeon: Jack Sharper, MD;  Location: [facility] INVASIVE CV LAB;  Service: Cardiovascular;  Laterality: N/A;   LYMPHADENECTOMY Bilateral 02/27/2013   Procedure: LYMPHADENECTOMY;  Surgeon: Noretta Ferrara, MD;  Location: [facility] ORS;  Service: Urology;  Laterality: Bilateral;   ROBOT ASSISTED LAPAROSCOPIC RADICAL PROSTATECTOMY N/A 02/27/2013   Procedure:  ROBOTIC ASSISTED LAPAROSCOPIC RADICAL PROSTATECTOMY LEVEL 2;  Surgeon: Noretta Ferrara, MD;  Location: [facility] ORS;  Service: Urology;  Laterality: N/A;   TONSILLECTOMY     age 39 Dr.Yut   TOTAL KNEE ARTHROPLASTY  07/2010   Dr Hiram   undescended testicle     age 24    Social History:  Tobacco Use History[1]  Social History   Substance and Sexual Activity  Alcohol Use No     Allergies[2]    Current Outpatient Medications  Medication Sig Dispense Refill   Albuterol-Budesonide (AIRSUPRA ) 90-80 MCG/ACT AERO Inhale 2 puffs into the lungs every 4 (four) hours as needed (wheezing). 5.9 g 0   aspirin  81 MG tablet Take 81 mg by mouth daily.     simvastatin  (ZOCOR ) 20 MG tablet Take 1 tablet  (20 mg total) by mouth at bedtime. 90 tablet 3   sodium chloride  (OCEAN) 0.65 % SOLN nasal spray Place 1 spray into both nostrils as needed for congestion.     telmisartan  (MICARDIS ) 80 MG tablet TAKE 1 TABLET BY MOUTH EVERY DAY 90 tablet 3   timolol  (TIMOPTIC ) 0.5 % ophthalmic solution Place 1 drop into the right eye 2 (two) times daily.     No current facility-administered medications for this visit.    (Not in a hospital admission)   Family History  Problem Relation Age of Onset   Heart attack Mother 90   Heart disease Mother    Stroke Father 52   Osteoarthritis Brother    Cancer Neg Hx    Diabetes Neg Hx      Review of Systems:   Review of Systems  Constitutional:  Positive for malaise/fatigue.  Eyes:  Negative for double vision.  Respiratory:  Positive for shortness of breath.   Cardiovascular:  Negative for chest pain, palpitations and leg swelling.  Gastrointestinal:  Negative for nausea and vomiting.  Neurological:  Positive for dizziness. Negative for headaches.      Physical Exam: BP (!) 180/100   Pulse 80   Resp 20   Ht 6' 1 (1.854 m)   Wt 232 lb (105.2 kg)   SpO2 96% Comment: RA  BMI 30.61 kg/m  Physical Exam Constitutional:      Appearance: Normal appearance.  HENT:     Head: Normocephalic and atraumatic.  Cardiovascular:     Rate and Rhythm: Normal rate.     Heart sounds: Murmur heard.  Pulmonary:     Effort: Pulmonary effort is normal.     Breath sounds: Normal breath sounds.  Abdominal:     Palpations: Abdomen is soft.  Musculoskeletal:     Comments: Mild bilateral edema  Neurological:     Mental Status: He is alert.      Cardiac Studies & Procedures   ______________________________________________________________________________________________ CARDIAC CATHETERIZATION  CARDIAC CATHETERIZATION 06/19/2024  Conclusion 1.  Patent left main with no stenosis 2.  Patent LAD with mild diffuse plaquing but no significant stenosis 3.   Patent circumflex with mild proximal plaquing and patent OM branches with no stenoses 4.  Patent RCA with mild diffuse plaquing and moderately severe ostial PDA stenosis 5.  Calcified, restricted aortic valve with known severe aortic stenosis  Recommendations: Medical therapy for CAD, continue with TAVR evaluation.  Findings Coronary Findings Diagnostic  Dominance: Right  Left Main There is mild diffuse disease throughout the vessel.  Left Anterior Descending There is mild diffuse disease throughout the vessel. The LAD has diffuse plaquing with no significant stenosis.  There is  mild hypodense plaquing in the mid LAD after the first diagonal.  The diagonal is patent with no stenosis.  Left Circumflex There is mild diffuse disease throughout the vessel. The circumflex is widely patent with no stenoses.  The OM branches are patent.  There is mild diffuse plaquing in the circumflex/OM branches.  First Obtuse Marginal Branch There is mild disease in the vessel.  Right Coronary Artery There is mild diffuse disease throughout the vessel. There is diffuse nonobstructive plaquing in the RCA.  The ostium of the PDA has a 60 to 70% lesion.  The PLA branch is patent with no stenosis.  Right Posterior Descending Artery RPDA lesion is 70% stenosed.  Intervention  No interventions have been documented.   STRESS TESTS  MYOCARDIAL PERFUSION IMAGING 10/27/2020  Interpretation Summary  Nuclear stress EF: 64%.  The left ventricular ejection fraction is normal (55-65%).  There was no ST segment deviation noted during stress.  The study is normal.  This is a low risk study.  No ischemia or infarction on perfusion images. Normal wall motion.   ECHOCARDIOGRAM  ECHOCARDIOGRAM COMPLETE 06/09/2024  Narrative ECHOCARDIOGRAM REPORT    Patient Name:   SHIVAAY STORMONT Date of Exam: 06/09/2024 Medical Rec #:  [id number]          Height:       73.0 in Accession #:    [id number]          Weight:       235.0 lb Date of Birth:  Sep 30, 1945          BSA:          2.304 m Patient Age:    78 years           BP:           134/88 mmHg Patient Gender: M                  HR:           80 bpm. Exam Location:  [address]  Procedure: 2D Echo, Color Doppler, Cardiac Doppler, 3D Echo and Strain Analysis (Both Spectral and Color Flow Doppler were utilized during procedure).  Indications:    Aortic Stenosis I35.0  History:        Patient has prior history of Echocardiogram examinations, most recent 09/25/2023. Risk Factors:Hypertension and Dyslipidemia.  Sonographer:    Augustin Seals RDCS Referring Phys: [phone] [facility]   Sonographer Comments: Suboptimal subcostal window. IMPRESSIONS   1. Left ventricular ejection fraction, by estimation, is 55 to 60%. Left ventricular ejection fraction by 3D volume is 57 %. The left ventricle has normal function. The left ventricle has no regional wall motion abnormalities. Indeterminate diastolic filling due to E-A fusion. The average left ventricular global longitudinal strain is -19.8 %. The global longitudinal strain is normal. 2. Right ventricular systolic function is normal. The right ventricular size is normal. 3. The mitral valve is grossly normal. Trivial mitral valve regurgitation. No evidence of mitral stenosis. 4. The aortic valve is calcified. Aortic valve regurgitation is not visualized. Moderate to severe aortic stenosis (peak velocity 3.88m/s, MG , AVA VTI 0.98cm2, DI 0.24, SVi 39).  Comparison(s): A prior study was performed on 09/25/2023. LVEF 60-65%, Grade I diastolic dysfunction, moderate to severe AS (peak velocity 3.55m/s, MG , AVA VTI 0.82cm2, DI 0.29).  FINDINGS Left Ventricle: Left ventricular ejection fraction, by estimation, is 55 to 60%. Left ventricular ejection fraction by 3D volume is 57 %. The left ventricle  has normal function. The left ventricle has no regional wall motion abnormalities. The  average left ventricular global longitudinal strain is -19.8 %. Strain was performed and the global longitudinal strain is normal. The left ventricular internal cavity size was normal in size. There is borderline left ventricular hypertrophy. Indeterminate diastolic filling due to E-A fusion.  Right Ventricle: The right ventricular size is normal. No increase in right ventricular wall thickness. Right ventricular systolic function is normal.  Left Atrium: Left atrial size was normal in size.  Right Atrium: Right atrial size was normal in size.  Pericardium: There is no evidence of pericardial effusion.  Mitral Valve: The mitral valve is grossly normal. Trivial mitral valve regurgitation. No evidence of mitral valve stenosis.  Tricuspid Valve: The tricuspid valve is grossly normal. Tricuspid valve regurgitation is mild . No evidence of tricuspid stenosis.  Aortic Valve: The aortic valve is calcified. Aortic valve regurgitation is not visualized. Moderate to severe aortic stenosis (peak velocity 3.63m/s, MG , AVA VTI 0.98cm2, DI 0.24, SVi 39). Aortic valve mean gradient measures 36.0 mmHg. Aortic valve peak gradient measures 59.3 mmHg. Aortic valve area, by VTI measures 0.98 cm.  Pulmonic Valve: The pulmonic valve was grossly normal. Pulmonic valve regurgitation is not visualized.  Aorta: The aortic root and ascending aorta are structurally normal, with no evidence of dilitation.  Venous: The inferior vena cava was not well visualized.  IAS/Shunts: The interatrial septum was not well visualized.  Additional Comments: 3D was performed not requiring image post processing on an independent workstation and was normal.   LEFT VENTRICLE PLAX 2D LVIDd:         3.10 cm         Diastology LVIDs:         2.30 cm         LV e' medial:  5.98 cm/s LV PW:         1.30 cm         LV e' lateral: 9.79 cm/s LV IVS:        1.30 cm LVOT diam:     2.30 cm         2D Longitudinal LV SV:          91              Strain LV SV Index:   39              2D Strain GLS   -19.7 % LVOT Area:     4.15 cm        (A4C): 2D Strain GLS   -18.0 % (A3C): 2D Strain GLS   -21.8 % (A2C): 2D Strain GLS   -19.8 % Avg:  3D Volume EF LV 3D EF:    Left ventricul ar ejection fraction by 3D volume is 57 %.  3D Volume EF: 3D EF:        57 % LV EDV:       162 ml LV ESV:       70 ml LV SV:        92 ml  RIGHT VENTRICLE RV Basal diam:  3.60 cm    PULMONARY VEINS RV Mid diam:    3.90 cm    A Reversal Velocity: 38.70 cm/s RV S prime:     8.27 cm/s  Diastolic Velocity:  40.90 cm/s TAPSE (M-mode): 1.7 cm     S/D Velocity:        1.30 Systolic Velocity:   53.20 cm/s  LEFT ATRIUM             Index        RIGHT ATRIUM           Index LA diam:        4.50 cm 1.95 cm/m   RA Area:     13.20 cm LA Vol (A2C):   45.5 ml 19.75 ml/m  RA Volume:   22.90 ml  9.94 ml/m LA Vol (A4C):   55.9 ml 24.26 ml/m LA Biplane Vol: 53.5 ml 23.22 ml/m AORTIC VALVE AV Area (Vmax):    1.15 cm AV Area (Vmean):   1.04 cm AV Area (VTI):     0.98 cm AV Vmax:           385.00 cm/s AV Vmean:          279.500 cm/s AV VTI:            0.927 m AV Peak Grad:      59.3 mmHg AV Mean Grad:      36.0 mmHg LVOT Vmax:         107.00 cm/s LVOT Vmean:        69.900 cm/s LVOT VTI:          0.219 m LVOT/AV VTI ratio: 0.24  AORTA Ao Root diam: 3.20 cm Ao Asc diam:  3.80 cm  TRICUSPID VALVE TR Peak grad:   24.6 mmHg TR Vmax:        248.00 cm/s  SHUNTS Systemic VTI:  0.22 m Systemic Diam: 2.30 cm  Sunit Tolia Electronically signed by Madonna Large Signature Date/Time: 06/09/2024/2:02:42 PM    Final      CT SCANS  CT CORONARY MORPH W/CTA COR W/SCORE 07/02/2024  Narrative CLINICAL DATA:  Aortic Stenosis.  EXAM: Cardiac TAVR CT  TECHNIQUE: A non-contrast, gated CT scan was obtained with axial slices of 2.5 mm through the heart for aortic valve scoring. A 120 kV retrospective, gated, contrast cardiac  scan was obtained. Gantry rotation speed was 230 msec and collimation was 0.63 mm. Nitroglycerin  was not given. A delayed scan was obtained to exclude left atrial appendage thrombus. The 3D dataset was reconstructed in systole with motion correction. The 3D data set was reconstructed in 5% intervals of the 0-95% of the R-R cycle. Systolic and diastolic phases were analyzed on a dedicated workstation using MPR, MIP, and VRT modes. The patient received 100 cc of contrast.  FINDINGS: Aortic Valve: Valve Morphology: Tricuspid with asymmetric calcification and reduced excursion the planimeter valve area is 1.24 Sq cm consistent with severe aortic stenosis  Annular and subannular calcification: None  Aortic Valve Calcium score: 3597  Perimembranous septal diameter: 7 mm  Mitral Valve: No significant calcification.  Aortic Annulus Measurements-25% Phase  Major annulus diameter: 30 mm  Minor annulus diameter: 25 mm  Annular perimeter: 84 mm  Annular area: 5.43 cm2  Aortic Measurements- 70% phase  Sinotubular Junction: 31 mm  Ascending Aorta: 36 mm  Descending Thoracic Aorta: 25 mm  Aortic atherosclerosis.  Sinus of Valsalva Measurements:  Right coronary cusp width: 32 mm  Left coronary cusp width: 33 mm  Non coronary cusp width: 33 mm  Coronary Artery Height above Annulus:  Left Main: 17 mm  Left SoV height: 23 mm  Right Coronary: 15 mm  Right SoV height: 23 mm  Optimum Fluoroscopic Angle for Delivery: LAO 6, CAU 4  Valves for structural team consideration:  Upper limits of 26 mm Sapien Valve  34 mm Evolut  Non TAVR Valve Findings:  Coronary Arteries: Normal coronary origin. Study not completed with nitroglycerin . Unable to exclude obstructive CAD form this study.  Coronary Calcium Score:  Left main: 0  Left anterior descending artery: 175  Left circumflex artery: 79  Right coronary artery: 315  Total: 569  Percentile: 61st for age, sex,  and race matched control.  Systemic veins: Normal anatomy.  Main Pulmonary artery: Severe dilation 35 mm.  Pulmonary veins: Normal anatomy.  Left atrial appendage: Patent.  Interatrial septum: Lipomatous interatrial septal hypertrophy with no clear communications  Chamber dimensions: Right ventricular dilation.  Pericardium: No significant calcification or pericardial effusion.  Extra Cardiac Findings as per separate reporting.  Image quality: Excellent.  Noise artifact is: Limited.  IMPRESSION: 1. Severe Aortic stenosis. Measurements for potential interventions as above.  Stanly Leavens MD   Electronically Signed By: Stanly Leavens M.D. On: 07/02/2024 11:19     ______________________________________________________________________________________________      ECG NSR w/ 1st AVB/LAFB    I have independently reviewed the above radiologic studies and discussed with the patient   Recent Lab Findings: Lab Results  Component Value Date   WBC 7.6 05/26/2024   HGB 16.2 05/26/2024   HCT 48.1 05/26/2024   PLT 194.0 05/26/2024   GLUCOSE 113 (H) 05/26/2024   CHOL 175 02/12/2024   TRIG 135.0 02/12/2024   HDL 40.20 02/12/2024   LDLCALC 108 (H) 02/12/2024   ALT 17 05/26/2024   AST 18 05/26/2024   NA 141 05/26/2024   K 4.3 05/26/2024   CL 105 05/26/2024   CREATININE 0.86 05/26/2024   BUN 23 05/26/2024   CO2 24 05/26/2024   TSH 2.18 06/24/2018   INR 1.09 08/05/2010   HGBA1C 5.7 02/12/2024      Assessment / Plan:   79 y.o. male with severe aortic stenosis.  STS score: 0.9%.  NYHA Class II.  The risks and benefits of transfemoral TAVR were discussed in detail.  The risks included death, stroke, paravalvular leak, aortic dissection, annulus rupture, device embolization, acute myocardial infarction, arrhythmia, heart block or need for permanent pacemaker.  We discussed his increased risk of heart block given pre-existing LAFB.  We also discussed  possibility of an emergent sternotomy to address any procedural complications.  Based on our discussion, we collectively decided that an emergent sternotomy would be indicated.  The patient is agreeable to proceed.  Based on my review of his LHC, echo, and CTA, I agree with the multidisciplinary plan to proceed with a transfemoral 29mm S3 UR TAVR.   I  spent 30 minutes counseling the patient face to face.   Con RAMAN Abdelrahman Nair 07/24/2024 2:34 PM      [1]  Social History Tobacco Use  Smoking Status Never  Smokeless Tobacco Never  [2]  Allergies Allergen Reactions   Penicillins     Angioedema   Dorzolamide Hcl-Timolol  Mal Swelling    Redness and swelling in the eye   Lisinopril     cough   Other Other (See Comments)    Spicy foods causes dyspepsia   "

## 2024-07-28 ENCOUNTER — Telehealth: Payer: Self-pay

## 2024-07-28 NOTE — Telephone Encounter (Signed)
 Copied from CRM [id number]. Topic: Appointments - Scheduling Inquiry for Clinic >> Jul 28, 2024 11:03 AM [name] wrote: Reason for CRM: patient is moving into a senior complex and they are requesting that he has another physical done before he can move in. his last physical was in 01/2024. Please fu with patient, I am unable to schedule another physical. He has forms that needs to be signed as well.

## 2024-07-28 NOTE — Telephone Encounter (Signed)
Spoke with patient, visit scheduled.

## 2024-07-29 ENCOUNTER — Ambulatory Visit: Admitting: Family Medicine

## 2024-07-29 ENCOUNTER — Encounter: Payer: Self-pay | Admitting: Family Medicine

## 2024-07-29 VITALS — BP 148/82 | HR 87 | Temp 98.0°F | Ht 73.0 in | Wt 235.6 lb

## 2024-07-29 DIAGNOSIS — Z23 Encounter for immunization: Secondary | ICD-10-CM | POA: Diagnosis not present

## 2024-07-29 DIAGNOSIS — I350 Nonrheumatic aortic (valve) stenosis: Secondary | ICD-10-CM | POA: Diagnosis not present

## 2024-07-29 DIAGNOSIS — Z111 Encounter for screening for respiratory tuberculosis: Secondary | ICD-10-CM

## 2024-07-29 DIAGNOSIS — I10 Essential (primary) hypertension: Secondary | ICD-10-CM

## 2024-07-29 LAB — CBC WITH DIFFERENTIAL/PLATELET
Basophils Absolute: 0.1 K/uL (ref 0.0–0.1)
Basophils Relative: 0.9 % (ref 0.0–3.0)
Eosinophils Absolute: 0.1 K/uL (ref 0.0–0.7)
Eosinophils Relative: 1.8 % (ref 0.0–5.0)
HCT: 45.3 % (ref 39.0–52.0)
Hemoglobin: 15.5 g/dL (ref 13.0–17.0)
Lymphocytes Relative: 20.3 % (ref 12.0–46.0)
Lymphs Abs: 1.2 K/uL (ref 0.7–4.0)
MCHC: 34.3 g/dL (ref 30.0–36.0)
MCV: 87.4 fl (ref 78.0–100.0)
Monocytes Absolute: 0.7 K/uL (ref 0.1–1.0)
Monocytes Relative: 11.7 % (ref 3.0–12.0)
Neutro Abs: 3.9 K/uL (ref 1.4–7.7)
Neutrophils Relative %: 65.3 % (ref 43.0–77.0)
Platelets: 149 K/uL — ABNORMAL LOW (ref 150.0–400.0)
RBC: 5.18 Mil/uL (ref 4.22–5.81)
RDW: 13.5 % (ref 11.5–15.5)
WBC: 6 K/uL (ref 4.0–10.5)

## 2024-07-29 LAB — POCT URINALYSIS DIP (CLINITEK)
Bilirubin, UA: NEGATIVE
Blood, UA: NEGATIVE
Glucose, UA: NEGATIVE mg/dL
Ketones, POC UA: NEGATIVE mg/dL
Leukocytes, UA: NEGATIVE
Nitrite, UA: NEGATIVE
POC PROTEIN,UA: NEGATIVE
Spec Grav, UA: >=1.03 — AB
Urobilinogen, UA: 1 U/dL
pH, UA: 6

## 2024-07-29 LAB — COMPREHENSIVE METABOLIC PANEL WITH GFR
ALT: 12 U/L (ref 3–53)
AST: 13 U/L (ref 5–37)
Albumin: 4.2 g/dL (ref 3.5–5.2)
Alkaline Phosphatase: 109 U/L (ref 39–117)
BUN: 18 mg/dL (ref 6–23)
CO2: 27 meq/L (ref 19–32)
Calcium: 9.5 mg/dL (ref 8.4–10.5)
Chloride: 105 meq/L (ref 96–112)
Creatinine, Ser: 0.79 mg/dL (ref 0.40–1.50)
GFR: 85.04 mL/min
Glucose, Bld: 99 mg/dL (ref 70–99)
Potassium: 4.7 meq/L (ref 3.5–5.1)
Sodium: 140 meq/L (ref 135–145)
Total Bilirubin: 1 mg/dL (ref 0.2–1.2)
Total Protein: 6.9 g/dL (ref 6.0–8.3)

## 2024-07-29 NOTE — Progress Notes (Signed)
 "  Acute Office Visit  Subjective:     Patient ID: [name], male    DOB: 10/07/1945, 79 y.o.   MRN: [id number]  No chief complaint on file.   HPI  Discussed the use of AI scribe software for clinical note transcription with the patient, who gave verbal consent to proceed.  History of Present Illness [name] is a 79 year old male with aortic valve stenosis who presents for pre-operative evaluation and vaccination update prior to moving to a continuing care retirement community.  Aortic valve stenosis and pre-operative status - Scheduled for transcatheter aortic valve replacement (TAVR) on January 28 for aortic valve stenosis. - Decreased stamina, which he attributes in part to prior COVID infections. - Cardiologist advised a 10% risk of requiring a pacemaker post-TAVR due to conduction issues near the left bundle branch block.  Covid-19 infection history - History of rebound COVID-19 infection with prolonged symptoms. - Feels that prior COVID infections have worsened his stamina.  Blood pressure monitoring - Recent blood pressure readings approximately 140/80 mmHg.  Vaccination and screening requirements - Nine years since last pneumonia vaccine. - Scheduled for tuberculosis screening and urinalysis as part of pre-move medical requirements for continuing care retirement community.     ROS Per HPI      Objective:    BP (!) 148/82 (BP Location: Left Arm, Patient Position: Sitting)   Pulse 87   Temp 98 F (36.7 C) (Temporal)   Ht 6' 1 (1.854 m)   Wt 235 lb 9.6 oz (106.9 kg)   SpO2 94%   BMI 31.08 kg/m    Physical Exam Vitals and nursing note reviewed.  Constitutional:      General: He is not in acute distress.    Appearance: Normal appearance.  HENT:     Head: Normocephalic and atraumatic.     Right Ear: External ear normal.     Left Ear: External ear normal.     Nose: Nose normal.     Mouth/Throat:     Mouth: Mucous membranes  are moist.     Pharynx: Oropharynx is clear.  Eyes:     Extraocular Movements: Extraocular movements intact.  Cardiovascular:     Rate and Rhythm: Normal rate and regular rhythm.     Pulses: Normal pulses.     Heart sounds: Murmur heard.  Pulmonary:     Effort: Pulmonary effort is normal. No respiratory distress.     Breath sounds: Normal breath sounds. No wheezing, rhonchi or rales.  Musculoskeletal:        General: Normal range of motion.     Cervical back: Normal range of motion.     Right lower leg: No edema.     Left lower leg: No edema.  Lymphadenopathy:     Cervical: No cervical adenopathy.  Skin:    General: Skin is warm and dry.  Neurological:     General: No focal deficit present.     Mental Status: He is alert and oriented to person, place, and time.  Psychiatric:        Mood and Affect: Mood normal.        Behavior: Behavior normal.     Results for orders placed or performed in visit on 07/29/24  QuantiFERON-TB Gold Plus  Result Value Ref Range   QuantiFERON-TB Gold Plus NEGATIVE NEGATIVE   NIL 0.01 IU/mL   Mitogen-NIL 6.22 IU/mL   TB1-NIL 0.01 IU/mL   TB2-NIL 0.00 IU/mL  CBC with  Differential/Platelet  Result Value Ref Range   WBC 6.0 4.0 - 10.5 K/uL   RBC 5.18 4.22 - 5.81 Mil/uL   Hemoglobin 15.5 13.0 - 17.0 g/dL   HCT 54.6 60.9 - 47.9 %   MCV 87.4 78.0 - 100.0 fl   MCHC 34.3 30.0 - 36.0 g/dL   RDW 86.4 88.4 - 84.4 %   Platelets 149.0 (L) 150.0 - 400.0 K/uL   Neutrophils Relative % 65.3 43.0 - 77.0 %   Lymphocytes Relative 20.3 12.0 - 46.0 %   Monocytes Relative 11.7 3.0 - 12.0 %   Eosinophils Relative 1.8 0.0 - 5.0 %   Basophils Relative 0.9 0.0 - 3.0 %   Neutro Abs 3.9 1.4 - 7.7 K/uL   Lymphs Abs 1.2 0.7 - 4.0 K/uL   Monocytes Absolute 0.7 0.1 - 1.0 K/uL   Eosinophils Absolute 0.1 0.0 - 0.7 K/uL   Basophils Absolute 0.1 0.0 - 0.1 K/uL  Comprehensive metabolic panel with GFR  Result Value Ref Range   Sodium 140 135 - 145 mEq/L   Potassium 4.7  3.5 - 5.1 mEq/L   Chloride 105 96 - 112 mEq/L   CO2 27 19 - 32 mEq/L   Glucose, Bld 99 70 - 99 mg/dL   BUN 18 6 - 23 mg/dL   Creatinine, Ser 9.20 0.40 - 1.50 mg/dL   Total Bilirubin 1.0 0.2 - 1.2 mg/dL   Alkaline Phosphatase 109 39 - 117 U/L   AST 13 5 - 37 U/L   ALT 12 3 - 53 U/L   Total Protein 6.9 6.0 - 8.3 g/dL   Albumin 4.2 3.5 - 5.2 g/dL   GFR 14.95 >39.99 mL/min   Calcium 9.5 8.4 - 10.5 mg/dL  POCT URINALYSIS DIP (CLINITEK)  Result Value Ref Range   Color, UA yellow yellow   Clarity, UA clear clear   Glucose, UA negative negative mg/dL   Bilirubin, UA negative negative   Ketones, POC UA negative negative mg/dL   Spec Grav, UA >=8.969 (A) 1.010 - 1.025   Blood, UA negative negative   pH, UA 6.0 5.0 - 8.0   POC PROTEIN,UA negative negative, trace   Urobilinogen, UA 1.0 0.2 or 1.0 E.U./dL   Nitrite, UA Negative Negative   Leukocytes, UA Negative Negative        Assessment & Plan:   Assessment and Plan Assessment & Plan Immunization Due, Tb Screen Discussion on upcoming move to a [facility] community and associated medical requirements. - Administered pneumonia vaccine. - Performed TB screening labs. - Provided urinalysis report via MyChart.  Aortic valve stenosis Severe stenosis with significant murmur. Scheduled for valve replacement on January 28th. Discussed potential need for pacemaker due to left anterior fascicular block. Explained risks and benefits of valve replacement, including potential for improved symptoms and reduced murmur. Informed of 10% risk of conduction issues post-procedure, with 90% chance of successful outcome. - Proceed with scheduled valve replacement on January 28th. - Monitor for potential need for pacemaker post-procedure.  Essential hypertension Blood pressure elevated, likely secondary to aortic valve stenosis. Current readings around 140/80 mmHg, acceptable given cardiac condition. - Continue current management and monitor blood  pressure.     Orders Placed This Encounter  Procedures   Pneumococcal conjugate vaccine 20-valent (Prevnar 20)   QuantiFERON-TB Gold Plus   CBC with Differential/Platelet    Release to patient:   Immediate [1]   Comprehensive metabolic panel with GFR    Release to patient:   Immediate [1]  POCT URINALYSIS DIP (CLINITEK)     No orders of the defined types were placed in this encounter.   Return if symptoms worsen or fail to improve.  Corean LITTIE Ku, FNP  "

## 2024-07-29 NOTE — Patient Instructions (Addendum)
 We have updated your pneumonia vaccine today.   Your urine is normal today.   We are checking labs today, will be in contact with any results that require further attention.  Follow-up with me for new or worsening symptoms.

## 2024-07-30 ENCOUNTER — Other Ambulatory Visit: Payer: Self-pay

## 2024-07-30 DIAGNOSIS — I350 Nonrheumatic aortic (valve) stenosis: Secondary | ICD-10-CM

## 2024-07-31 ENCOUNTER — Ambulatory Visit: Payer: Self-pay | Admitting: Family Medicine

## 2024-07-31 LAB — QUANTIFERON-TB GOLD PLUS
Mitogen-NIL: 6.22 [IU]/mL
NIL: 0.01 [IU]/mL
QuantiFERON-TB Gold Plus: NEGATIVE
TB1-NIL: 0.01 [IU]/mL
TB2-NIL: 0 [IU]/mL

## 2024-08-08 ENCOUNTER — Ambulatory Visit (HOSPITAL_COMMUNITY)
Admission: RE | Admit: 2024-08-08 | Discharge: 2024-08-08 | Disposition: A | Source: Ambulatory Visit | Attending: Cardiovascular Disease | Admitting: Cardiovascular Disease

## 2024-08-08 ENCOUNTER — Other Ambulatory Visit: Payer: Self-pay

## 2024-08-08 ENCOUNTER — Encounter (HOSPITAL_COMMUNITY)
Admission: RE | Admit: 2024-08-08 | Discharge: 2024-08-08 | Disposition: A | Source: Ambulatory Visit | Attending: Cardiovascular Disease

## 2024-08-08 DIAGNOSIS — Z01818 Encounter for other preprocedural examination: Secondary | ICD-10-CM | POA: Insufficient documentation

## 2024-08-08 DIAGNOSIS — I350 Nonrheumatic aortic (valve) stenosis: Secondary | ICD-10-CM | POA: Diagnosis present

## 2024-08-08 LAB — TYPE AND SCREEN
ABO/RH(D): A POS
Antibody Screen: NEGATIVE

## 2024-08-08 LAB — URINALYSIS, ROUTINE W REFLEX MICROSCOPIC
Bacteria, UA: NONE SEEN
Bilirubin Urine: NEGATIVE
Glucose, UA: NEGATIVE mg/dL
Hgb urine dipstick: NEGATIVE
Ketones, ur: NEGATIVE mg/dL
Nitrite: NEGATIVE
Protein, ur: NEGATIVE mg/dL
Specific Gravity, Urine: 1.02 (ref 1.005–1.030)
pH: 6 (ref 5.0–8.0)

## 2024-08-08 LAB — PROTIME-INR
INR: 1 (ref 0.8–1.2)
Prothrombin Time: 14 s (ref 11.4–15.2)

## 2024-08-08 LAB — SURGICAL PCR SCREEN
MRSA, PCR: NEGATIVE
Staphylococcus aureus: NEGATIVE

## 2024-08-08 LAB — CBC
HCT: 48.1 % (ref 39.0–52.0)
Hemoglobin: 16.6 g/dL (ref 13.0–17.0)
MCH: 30.5 pg (ref 26.0–34.0)
MCHC: 34.5 g/dL (ref 30.0–36.0)
MCV: 88.4 fL (ref 80.0–100.0)
Platelets: 206 K/uL (ref 150–400)
RBC: 5.44 MIL/uL (ref 4.22–5.81)
RDW: 13.2 % (ref 11.5–15.5)
WBC: 6.7 K/uL (ref 4.0–10.5)
nRBC: 0 % (ref 0.0–0.2)

## 2024-08-08 LAB — COMPREHENSIVE METABOLIC PANEL WITH GFR
ALT: 12 U/L (ref 0–44)
AST: 17 U/L (ref 15–41)
Albumin: 4.2 g/dL (ref 3.5–5.0)
Alkaline Phosphatase: 109 U/L (ref 38–126)
Anion gap: 9 (ref 5–15)
BUN: 23 mg/dL (ref 8–23)
CO2: 25 mmol/L (ref 22–32)
Calcium: 9.4 mg/dL (ref 8.9–10.3)
Chloride: 104 mmol/L (ref 98–111)
Creatinine, Ser: 0.9 mg/dL (ref 0.61–1.24)
GFR, Estimated: >60 mL/min
Glucose, Bld: 114 mg/dL — ABNORMAL HIGH (ref 70–99)
Potassium: 4.4 mmol/L (ref 3.5–5.1)
Sodium: 138 mmol/L (ref 135–145)
Total Bilirubin: 1.2 mg/dL (ref 0.0–1.2)
Total Protein: 7.1 g/dL (ref 6.5–8.1)

## 2024-08-08 NOTE — Progress Notes (Signed)
 All consents signed by patient at PAT lab appointment. Pt was sent home with printed copy of surgical instructions and CHG soap/CHG soap instructions. All instructions reviewed with patient and questions answered.  Patients chart send to anesthesia for review. Pt denies any respiratory illness/infection in the last two months.

## 2024-08-11 ENCOUNTER — Inpatient Hospital Stay (HOSPITAL_COMMUNITY): Admission: RE | Admit: 2024-08-11 | Source: Ambulatory Visit

## 2024-08-12 MED ORDER — POTASSIUM CHLORIDE 2 MEQ/ML IV SOLN
80.0000 meq | INTRAVENOUS | Status: DC
  Filled 2024-08-12: qty 40

## 2024-08-12 MED ORDER — HEPARIN 30,000 UNITS/1000 ML (OHS) CELLSAVER SOLUTION
Status: DC
  Filled 2024-08-12: qty 1000

## 2024-08-12 MED ORDER — MAGNESIUM SULFATE 50 % IJ SOLN
40.0000 meq | INTRAMUSCULAR | Status: DC
  Filled 2024-08-12: qty 9.85

## 2024-08-12 MED ORDER — CEFAZOLIN SODIUM-DEXTROSE 2-4 GM/100ML-% IV SOLN
2.0000 g | INTRAVENOUS | Status: AC
  Administered 2024-08-13: 2 g via INTRAVENOUS
  Filled 2024-08-12: qty 100

## 2024-08-12 MED ORDER — NOREPINEPHRINE 4 MG/250ML-% IV SOLN
0.0000 ug/min | INTRAVENOUS | Status: AC
  Administered 2024-08-13: 3 ug/min via INTRAVENOUS
  Filled 2024-08-12: qty 250

## 2024-08-12 MED ORDER — DEXMEDETOMIDINE HCL IN NACL 400 MCG/100ML IV SOLN
0.1000 ug/kg/h | INTRAVENOUS | Status: AC
  Administered 2024-08-13: 106.92 ug via INTRAVENOUS
  Administered 2024-08-13: 1 ug/kg/h via INTRAVENOUS
  Filled 2024-08-12: qty 100

## 2024-08-13 ENCOUNTER — Encounter (HOSPITAL_COMMUNITY): Admission: RE | Payer: Self-pay | Source: Home / Self Care

## 2024-08-13 ENCOUNTER — Inpatient Hospital Stay (HOSPITAL_COMMUNITY)
Admission: RE | Admit: 2024-08-13 | Discharge: 2024-08-14 | DRG: 267 | Disposition: A | Discharge status: Home / Self Care | Attending: Cardiovascular Disease | Admitting: Cardiovascular Disease

## 2024-08-13 ENCOUNTER — Encounter (HOSPITAL_COMMUNITY): Payer: Self-pay | Admitting: Vascular Surgery

## 2024-08-13 ENCOUNTER — Encounter (HOSPITAL_COMMUNITY): Payer: Self-pay | Admitting: Cardiovascular Disease

## 2024-08-13 ENCOUNTER — Inpatient Hospital Stay (HOSPITAL_COMMUNITY): Admitting: Certified Registered Nurse Anesthetist

## 2024-08-13 ENCOUNTER — Inpatient Hospital Stay (HOSPITAL_COMMUNITY)

## 2024-08-13 ENCOUNTER — Other Ambulatory Visit: Payer: Self-pay

## 2024-08-13 DIAGNOSIS — Z952 Presence of prosthetic heart valve: Principal | ICD-10-CM

## 2024-08-13 DIAGNOSIS — I440 Atrioventricular block, first degree: Secondary | ICD-10-CM | POA: Diagnosis present

## 2024-08-13 DIAGNOSIS — Z8546 Personal history of malignant neoplasm of prostate: Secondary | ICD-10-CM

## 2024-08-13 DIAGNOSIS — I10 Essential (primary) hypertension: Secondary | ICD-10-CM | POA: Diagnosis not present

## 2024-08-13 DIAGNOSIS — Z91018 Allergy to other foods: Secondary | ICD-10-CM

## 2024-08-13 DIAGNOSIS — Z006 Encounter for examination for normal comparison and control in clinical research program: Secondary | ICD-10-CM | POA: Diagnosis not present

## 2024-08-13 DIAGNOSIS — Z7982 Long term (current) use of aspirin: Secondary | ICD-10-CM

## 2024-08-13 DIAGNOSIS — Z9079 Acquired absence of other genital organ(s): Secondary | ICD-10-CM

## 2024-08-13 DIAGNOSIS — E782 Mixed hyperlipidemia: Secondary | ICD-10-CM | POA: Diagnosis present

## 2024-08-13 DIAGNOSIS — J449 Chronic obstructive pulmonary disease, unspecified: Secondary | ICD-10-CM | POA: Diagnosis not present

## 2024-08-13 DIAGNOSIS — I350 Nonrheumatic aortic (valve) stenosis: Secondary | ICD-10-CM

## 2024-08-13 DIAGNOSIS — Z823 Family history of stroke: Secondary | ICD-10-CM

## 2024-08-13 DIAGNOSIS — Z96652 Presence of left artificial knee joint: Secondary | ICD-10-CM | POA: Diagnosis present

## 2024-08-13 DIAGNOSIS — Z880 Allergy status to penicillin: Secondary | ICD-10-CM

## 2024-08-13 DIAGNOSIS — E785 Hyperlipidemia, unspecified: Secondary | ICD-10-CM | POA: Diagnosis present

## 2024-08-13 DIAGNOSIS — Z79899 Other long term (current) drug therapy: Secondary | ICD-10-CM

## 2024-08-13 DIAGNOSIS — I2510 Atherosclerotic heart disease of native coronary artery without angina pectoris: Secondary | ICD-10-CM | POA: Diagnosis present

## 2024-08-13 DIAGNOSIS — M170 Bilateral primary osteoarthritis of knee: Secondary | ICD-10-CM | POA: Diagnosis present

## 2024-08-13 DIAGNOSIS — Z888 Allergy status to other drugs, medicaments and biological substances status: Secondary | ICD-10-CM

## 2024-08-13 DIAGNOSIS — Z8249 Family history of ischemic heart disease and other diseases of the circulatory system: Secondary | ICD-10-CM

## 2024-08-13 DIAGNOSIS — C61 Malignant neoplasm of prostate: Secondary | ICD-10-CM | POA: Diagnosis present

## 2024-08-13 HISTORY — DX: Nonrheumatic aortic (valve) stenosis: I35.0

## 2024-08-13 LAB — ECHOCARDIOGRAM LIMITED
AR max vel: 2.34 cm2
AV Area VTI: 2.25 cm2
AV Area mean vel: 2.37 cm2
AV Mean grad: 3 mmHg
AV Peak grad: 5.9 mmHg
Ao pk vel: 1.21 m/s
S' Lateral: 3.2 cm

## 2024-08-13 MED ORDER — CHLORHEXIDINE GLUCONATE 4 % EX SOLN
60.0000 mL | Freq: Once | CUTANEOUS | Status: DC
  Filled 2024-08-13: qty 60

## 2024-08-13 MED ORDER — LIDOCAINE HCL (PF) 1 % IJ SOLN
INTRAMUSCULAR | Status: DC | PRN
  Administered 2024-08-13 (×2): 5 mL via INTRADERMAL

## 2024-08-13 MED ORDER — SODIUM CHLORIDE 0.9% FLUSH
3.0000 mL | Freq: Two times a day (BID) | INTRAVENOUS | Status: DC
  Administered 2024-08-14 (×2): 3 mL via INTRAVENOUS

## 2024-08-13 MED ORDER — HEPARIN (PORCINE) IN NACL 1000-0.9 UT/500ML-% IV SOLN
INTRAVENOUS | Status: DC | PRN
  Administered 2024-08-13: 500 mL

## 2024-08-13 MED ORDER — ACETAMINOPHEN 650 MG RE SUPP: 650.0000 mg | Freq: Four times a day (QID) | RECTAL | Status: DC | PRN

## 2024-08-13 MED ORDER — CHLORHEXIDINE GLUCONATE 0.12 % MT SOLN: 15.0000 mL | Freq: Once | OROMUCOSAL | Status: AC

## 2024-08-13 MED ORDER — ACETAMINOPHEN 500 MG PO TABS
ORAL_TABLET | ORAL | Status: AC
  Administered 2024-08-13: 1000 mg via ORAL
  Filled 2024-08-13: qty 2

## 2024-08-13 MED ORDER — PROPOFOL 10 MG/ML IV BOLUS
INTRAVENOUS | Status: DC | PRN
  Administered 2024-08-13: 20 mg via INTRAVENOUS

## 2024-08-13 MED ORDER — HEPARIN SODIUM (PORCINE) 1000 UNIT/ML IJ SOLN
INTRAMUSCULAR | Status: DC | PRN
  Administered 2024-08-13: 16000 [IU] via INTRAVENOUS

## 2024-08-13 MED ORDER — SODIUM CHLORIDE 0.9% FLUSH: 3.0000 mL | INTRAVENOUS | Status: DC | PRN

## 2024-08-13 MED ORDER — TIMOLOL MALEATE 0.5 % OP SOLN
1.0000 [drp] | Freq: Two times a day (BID) | OPHTHALMIC | Status: DC
  Administered 2024-08-13 – 2024-08-14 (×2): 1 [drp] via OPHTHALMIC
  Filled 2024-08-13: qty 5

## 2024-08-13 MED ORDER — LACTATED RINGERS IV SOLN: INTRAVENOUS | Status: DC

## 2024-08-13 MED ORDER — PROPOFOL 500 MG/50ML IV EMUL
INTRAVENOUS | Status: DC | PRN
  Administered 2024-08-13: 20 ug/kg/min via INTRAVENOUS

## 2024-08-13 MED ORDER — CHLORHEXIDINE GLUCONATE 0.12 % MT SOLN
OROMUCOSAL | Status: AC
  Administered 2024-08-13: 15 mL via OROMUCOSAL
  Filled 2024-08-13: qty 15

## 2024-08-13 MED ORDER — ACETAMINOPHEN 500 MG PO TABS: 1000.0000 mg | ORAL_TABLET | Freq: Once | ORAL | Status: AC

## 2024-08-13 MED ORDER — ACETAMINOPHEN 325 MG PO TABS: 650.0000 mg | ORAL_TABLET | Freq: Four times a day (QID) | ORAL | Status: DC | PRN

## 2024-08-13 MED ORDER — CEFAZOLIN SODIUM-DEXTROSE 2-4 GM/100ML-% IV SOLN
2.0000 g | Freq: Three times a day (TID) | INTRAVENOUS | Status: AC
  Administered 2024-08-13 – 2024-08-14 (×2): 2 g via INTRAVENOUS
  Filled 2024-08-13 (×2): qty 100

## 2024-08-13 MED ORDER — TRAMADOL HCL 50 MG PO TABS: 50.0000 mg | ORAL_TABLET | ORAL | Status: DC | PRN

## 2024-08-13 MED ORDER — SODIUM CHLORIDE 0.9 % IV SOLN: 250.0000 mL | INTRAVENOUS | Status: DC | PRN

## 2024-08-13 MED ORDER — PROTAMINE SULFATE 10 MG/ML IV SOLN
INTRAVENOUS | Status: DC | PRN
  Administered 2024-08-13: 90 mg via INTRAVENOUS

## 2024-08-13 MED ORDER — NOREPINEPHRINE 4 MG/250ML-% IV SOLN
0.0000 ug/min | INTRAVENOUS | Status: DC
  Filled 2024-08-13: qty 250

## 2024-08-13 MED ORDER — SODIUM CHLORIDE 0.9 % IV SOLN: INTRAVENOUS | Status: DC

## 2024-08-13 MED ORDER — SODIUM CHLORIDE 0.9 % IV SOLN: INTRAVENOUS | Status: AC

## 2024-08-13 MED ORDER — IODIXANOL 320 MG/ML IV SOLN
INTRAVENOUS | Status: DC | PRN
  Administered 2024-08-13: 45 mL

## 2024-08-13 MED ORDER — ONDANSETRON HCL 4 MG/2ML IJ SOLN: 4.0000 mg | Freq: Four times a day (QID) | INTRAMUSCULAR | Status: DC | PRN

## 2024-08-13 MED ORDER — NITROGLYCERIN IN D5W 200-5 MCG/ML-% IV SOLN: 0.0000 ug/min | INTRAVENOUS | Status: DC

## 2024-08-13 MED ORDER — SIMVASTATIN 20 MG PO TABS
20.0000 mg | ORAL_TABLET | Freq: Every day | ORAL | Status: DC
  Administered 2024-08-13: 20 mg via ORAL
  Filled 2024-08-13: qty 1

## 2024-08-13 MED ORDER — ASPIRIN 81 MG PO TBEC
81.0000 mg | DELAYED_RELEASE_TABLET | Freq: Every day | ORAL | Status: DC
  Administered 2024-08-13 – 2024-08-14 (×2): 81 mg via ORAL
  Filled 2024-08-13 (×2): qty 1

## 2024-08-13 MED ORDER — OXYCODONE HCL 5 MG PO TABS: 5.0000 mg | ORAL_TABLET | ORAL | Status: DC | PRN

## 2024-08-13 MED ORDER — CHLORHEXIDINE GLUCONATE 0.12 % MT SOLN: 15.0000 mL | Freq: Once | OROMUCOSAL | Status: DC

## 2024-08-13 MED ORDER — FENTANYL CITRATE (PF) 100 MCG/2ML IJ SOLN
INTRAMUSCULAR | Status: AC
  Filled 2024-08-13: qty 2

## 2024-08-13 MED ORDER — HEPARIN (PORCINE) IN NACL 2000-0.9 UNIT/L-% IV SOLN
INTRAVENOUS | Status: DC | PRN
  Administered 2024-08-13: 1000 mL

## 2024-08-13 MED ORDER — FENTANYL CITRATE (PF) 100 MCG/2ML IJ SOLN
INTRAMUSCULAR | Status: DC | PRN
  Administered 2024-08-13: 25 ug via INTRAVENOUS

## 2024-08-13 MED ORDER — CHLORHEXIDINE GLUCONATE 4 % EX SOLN
30.0000 mL | CUTANEOUS | Status: DC
  Filled 2024-08-13: qty 30

## 2024-08-13 MED ORDER — MORPHINE SULFATE (PF) 2 MG/ML IV SOLN: 1.0000 mg | INTRAVENOUS | Status: DC | PRN

## 2024-08-13 MED ORDER — CHLORHEXIDINE GLUCONATE 4 % EX SOLN: 60.0000 mL | Freq: Once | CUTANEOUS | Status: DC

## 2024-08-13 NOTE — CV Procedure (Signed)
 " HEART AND VASCULAR CENTER  TAVR OPERATIVE NOTE   Date of Procedure:  08/13/2024  Preoperative Diagnosis: Severe Aortic Stenosis   Postoperative Diagnosis: Same   Procedure:   Transcatheter Aortic Valve Replacement - Transfemoral Approach  Edwards Sapien 3 THV (size 29 mm, model # [id number], serial # [id number] )   Co-Surgeons:  Lonni Cash, MD and Con Clunes , MD   Anesthesiologist:  Leonce  Echocardiographer:  Croitoru  Pre-operative Echo Findings: Severe aortic stenosis Normal left ventricular systolic function  Post-operative Echo Findings: No paravalvular leak Normal left ventricular systolic function  BRIEF CLINICAL NOTE AND INDICATIONS FOR SURGERY  79 yo male with history of HTN, LAFB/1st degree AV block and severe aortic stenosis. Mild to moderate non-obstructive CAD. LVEF=55%.   During the course of the patient's preoperative work up they have been evaluated comprehensively by a multidisciplinary team of specialists coordinated through the Multidisciplinary Heart Valve Clinic in the [facility].  They have been demonstrated to suffer from symptomatic severe aortic stenosis as noted above. The patient has been counseled extensively as to the relative risks and benefits of all options for the treatment of severe aortic stenosis including long term medical therapy, conventional surgery for aortic valve replacement, and transcatheter aortic valve replacement.  The patient has been independently evaluated by Dr. Clunes with CT surgery and they are felt to be at high risk for conventional surgical aortic valve replacement. The surgeon indicated the patient would be a poor candidate for conventional surgery. Based upon review of all of the patient's preoperative diagnostic tests they are felt to be candidate for transcatheter aortic valve replacement using the transfemoral approach as an alternative to high risk conventional surgery.    Following the  decision to proceed with transcatheter aortic valve replacement, a discussion has been held regarding what types of management strategies would be attempted intraoperatively in the event of life-threatening complications, including whether or not the patient would be considered a candidate for the use of cardiopulmonary bypass and/or conversion to open sternotomy for attempted surgical intervention.  The patient has been advised of a variety of complications that might develop peculiar to this approach including but not limited to risks of death, stroke, paravalvular leak, aortic dissection or other major vascular complications, aortic annulus rupture, device embolization, cardiac rupture or perforation, acute myocardial infarction, arrhythmia, heart block or bradycardia requiring permanent pacemaker placement, congestive heart failure, respiratory failure, renal failure, pneumonia, infection, other late complications related to structural valve deterioration or migration, or other complications that might ultimately cause a temporary or permanent loss of functional independence or other long term morbidity.  The patient provides full informed consent for the procedure as described and all questions were answered preoperatively.    DETAILS OF THE OPERATIVE PROCEDURE  PREPARATION:   The patient is brought to the operating room on the above mentioned date and central monitoring was established by the anesthesia team including placement of a radial arterial line. The patient is placed in the supine position on the operating table.  Intravenous antibiotics are administered. Conscious sedation is used.   Baseline transthoracic echocardiogram was performed. The patient's chest, abdomen, both groins, and both lower extremities are prepared and draped in a sterile manner. A time out procedure is performed.   PERIPHERAL ACCESS:   Using the modified Seldinger technique, femoral arterial and venous access were  obtained with placement of a 6 Fr sheath in the artery and a 7 Fr sheath in the vein  on the left side using u/s guidance.  A pigtail diagnostic catheter was passed through the femoral arterial sheath under fluoroscopic guidance into the aortic root.  A temporary transvenous pacemaker catheter was passed through the femoral venous sheath under fluoroscopic guidance into the right ventricle.  The pacemaker was tested to ensure stable lead placement and pacemaker capture. Aortic root angiography was performed in order to determine the optimal angiographic angle for valve deployment.  TRANSFEMORAL ACCESS:  A micropuncture kit was used to gain access to the right femoral artery using u/s guidance. Position confirmed with angiography. Pre-closure with double ProGlide closure devices. The patient was heparinized systemically and ACT verified > 250 seconds.    A 16 Fr transfemoral E-sheath was introduced into the right femoral artery over an Amplatz superstiff wire. An AL-2 catheter was used to direct a straight-tip exchange length wire across the native aortic valve into the left ventricle. This was exchanged out for a pigtail catheter and position was confirmed in the LV apex. Simultaneous LV and Ao pressures were recorded.  The pigtail catheter was then exchanged for a Safari wire in the LV apex.   TRANSCATHETER HEART VALVE DEPLOYMENT:  An Edwards Sapien 3 THV (size 29 mm) was prepared and crimped per manufacturer's guidelines, and the proper orientation of the valve is confirmed on the [employer] delivery system. The valve was advanced through the introducer sheath using normal technique until in an appropriate position in the abdominal aorta beyond the sheath tip. The balloon was then retracted and using the fine-tuning wheel was centered on the valve. The valve was then advanced across the aortic arch using appropriate flexion of the catheter. The valve was carefully positioned across the aortic valve  annulus. The Commander catheter was retracted using normal technique. Once final position of the valve has been confirmed by angiographic assessment, the valve is deployed during rapid ventricular pacing to maintain systolic blood pressure < 50 mmHg and pulse pressure < 10 mmHg. The balloon inflation is held for >3 seconds after reaching full deployment volume. Once the balloon has fully deflated the balloon is retracted into the ascending aorta and valve function is assessed using TTE. There is felt to be no paravalvular leak and no central aortic insufficiency.  The patient's hemodynamic recovery following valve deployment is good.  The deployment balloon and guidewire are both removed.   PROCEDURE COMPLETION:  The sheath was then removed and closure devices were completed. Protamine  was administered once femoral arterial repair was complete. The temporary pacemaker, pigtail catheters and femoral sheaths were removed with a Mynx closure device placed in the artery and manual pressure used for venous hemostasis.    The patient tolerated the procedure well and is transported to the recovery room in stable condition. There were no immediate intraoperative complications.   No blood products were administered during the operation.  The patient received a total of 45 mL of intravenous contrast during the procedure.  LVEDP: 8 mmHg  Lonni Cash MD, [facility] 08/13/2024 3:37 PM       "

## 2024-08-13 NOTE — Discharge Summary (Incomplete)
 " HEART AND VASCULAR CENTER   MULTIDISCIPLINARY HEART VALVE TEAM  Discharge Summary    Patient ID: [name] MRN: [id number]; DOB: 1945-08-26  Admit date: 08/13/2024 Discharge date: 08/14/2024  PCP:  [name], FNP  [facility] Cardiologist:  Ozell Fell, MD  [facility] Structural heart: Lonni Cash, MD [facility] Electrophysiologist:  None   Discharge Diagnoses    Principal Problem:   S/P TAVR (transcatheter aortic valve replacement) Active Problems:   HYPERLIPIDEMIA   Essential hypertension   Prostate cancer [facility])   Severe aortic stenosis   Allergies Allergies[1]  Diagnostic Studies/Procedures    HEART AND VASCULAR CENTER  TAVR OPERATIVE NOTE     Date of Procedure:                08/13/2024   Preoperative Diagnosis:      Severe Aortic Stenosis    Postoperative Diagnosis:    Same    Procedure:        Transcatheter Aortic Valve Replacement - Transfemoral Approach             Edwards Sapien 3 THV (size 29 mm, model # [id number], serial # [id number] )              Co-Surgeons:                        Lonni Cash, MD and Con Clunes , MD    Anesthesiologist:                  Leonce   Echocardiographer:              Croitoru   Pre-operative Echo Findings: Severe aortic stenosis Normal left ventricular systolic function   Post-operative Echo Findings: No paravalvular leak Normal left ventricular systolic function   _____________    Echo 08/14/24: completed but pending formal read at the time of discharge   History of Present Illness     [name] is a 79 y.o. male with a history of prostate cancer s/p radical prostatectomy, 1st deg AV block/LAFB and severe aortic stenosis who presented to [facility] on 08/13/24 for planned TAVR.   Pt reported exertional dyspnea with walking uphill or at a brisk pace. Echo 09/2023 showed LVEF 60-65%, normal RV function, and moderately severe aortic stenosis with mean gradient of  30 mmHg, V-max of 3.6 m/s, AVA 0.8, DVI 0.29, SVI 26. LHC 06/19/24 showed mild non obst CAD with severe ostial PDA stenosis.   The patient was evaluated by the multidisciplinary valve team and felt to have severe, symptomatic aortic stenosis and to be a suitable candidate for TAVR, which was set up for 08/13/24.   Hospital Course     Consultants: none   Severe AS:  -- S/p TAVR with a 29 mm Edwards Sapien 3 Ultra Resilia THV via the TF approach on 08/13/24.  -- Post operative echo completed but pending formal read. -- Groin sites are stable.  -- ECG with old 1st deg AV block and no high grade heart block. -- Continue Asprin 81mg  daily. -- Met with cardiac rehab to discuss CRP phase II.  -- Plan for discharge home today with close follow up in the outpatient setting.   HTN: -- BP well controlled.   -- Resume home telmisaran 80mg  daily.   HLD: -- Continue simvastatin  20mg  daily.   _____________  Discharge Vitals Blood pressure 129/76, pulse 89, temperature 97.8 F (36.6 C), temperature source Oral,  resp. rate 16, height 6' 1 (1.854 m), weight 104.4 kg, SpO2 95%.  Filed Weights   08/13/24 1022 08/13/24 1658 08/14/24 0548  Weight: 105.2 kg 103.4 kg 104.4 kg     GEN: Well nourished, well developed in no acute distress NECK: No JVD CARDIAC: RRR, no murmurs, rubs, gallops RESPIRATORY:  Clear to auscultation without rales, wheezing or rhonchi  ABDOMEN: Soft, non-tender, non-distended EXTREMITIES:  No edema; No deformity.  Groin sites clear without hematoma or ecchymosis.    Disposition   Pt is being discharged home today in good condition.  Follow-up Plans & Appointments     Follow-up Information     Sebastian Lamarr SAUNDERS, PA-C. Go on 08/21/2024.   Specialties: Cardiology, Radiology Why: @ 3:35pm, please arrive at least 20 minutes early Contact information: [address] [place] [address] [phone]                  Discharge Medications    Allergies as of 08/14/2024       Reactions   Penicillins    Angioedema   Dorzolamide Hcl-timolol  Mal Swelling   Redness and swelling in the eye   Lisinopril Cough   Other Other (See Comments)   Spicy foods causes dyspepsia        Medication List     TAKE these medications    Airsupra  90-80 MCG/ACT Aero Generic drug: Albuterol-Budesonide Inhale 2 puffs into the lungs every 4 (four) hours as needed (wheezing).   aspirin  81 MG tablet Take 81 mg by mouth daily.   ibuprofen 200 MG tablet Commonly known as: ADVIL Take 200 mg by mouth every 6 (six) hours as needed for moderate pain (pain score 4-6).   simvastatin  20 MG tablet Commonly known as: ZOCOR  Take 1 tablet (20 mg total) by mouth at bedtime.   sodium chloride  0.65 % Soln nasal spray Commonly known as: OCEAN Place 1 spray into both nostrils as needed for congestion.   telmisartan  80 MG tablet Commonly known as: MICARDIS  TAKE 1 TABLET BY MOUTH EVERY DAY   timolol  0.5 % ophthalmic solution Commonly known as: TIMOPTIC  Place 1 drop into the right eye 2 (two) times daily.            Outstanding Labs/Studies   none  ______________________  Duration of Discharge Encounter: APP Time: 15 minutes    Signed, Lamarr Sebastian, PA-C 08/14/2024, 11:11 AM [phone]  Haroldine SAILOR Rayborn was seen by me today along with Lamarr Sebastian, PA-C. I have personally performed an evaluation on this patient.  My findings are as follows:  79 y.o. male with severe AS, now one day s/p TAVR Doing well.   Data: EKG(s) and pertinent labs, studies, etc were personally reviewed and interpreted by me:  EKG reviewed by me: sinus Telemetry reviewed by me: sinus All labs reviewed by me Otherwise, I agree with data as outlined by the advanced practice provider.  Exam performed by me: Gen: NAD Neck: No JVD Cardiac: RRR without murmur Lungs: clear bilaterally Extremities: No LE edema  My Assessment and  Plan:  Severe AS s/p TAVR: Doing well. Groins stable. Sinus on tele. Will d/c home today on ASA.   The patient will be discharged home today  I spent 20 minutes seeing the patient. During that time, I reviewed the labs, EKG, telemetry, echo images, evaluated their symptoms and performed an examination. This time also included plan formulation, discussion of the plan with the patient and the time spent with documentation.  Signed,  Lonni Cash, MD  08/14/2024 11:58 AM       [1]  Allergies Allergen Reactions   Penicillins     Angioedema   Dorzolamide Hcl-Timolol  Mal Swelling    Redness and swelling in the eye   Lisinopril Cough   Other Other (See Comments)    Spicy foods causes dyspepsia   "

## 2024-08-13 NOTE — Anesthesia Preprocedure Evaluation (Addendum)
"                                    Anesthesia Evaluation  Patient identified by MRN, date of birth, ID band Patient awake    Reviewed: Allergy & Precautions, NPO status , Patient's Chart, lab work & pertinent test results  History of Anesthesia Complications Negative for: history of anesthetic complications  Airway Mallampati: II  TM Distance: >3 FB Neck ROM: Full    Dental  (+) Dental Advisory Given   Pulmonary COPD (seldom uses),  COPD inhaler   breath sounds clear to auscultation       Cardiovascular hypertension, Pt. on medications + angina with exertion + CAD (non-obstructive)  + Valvular Problems/Murmurs (severe) AS  Rhythm:Regular Rate:Normal + Systolic murmurs 88/7974 ECHO: EF 55 to 60%.  1. LV EF 57 %. The left ventricle has normal function, no regional wall motion  abnormalities. The average left ventricular global longitudinal strain is -19.8 %. The global longitudinal strain is normal.   2. RVF is normal. The right ventricular size is normal.   3. The mitral valve is grossly normal. Trivial mitral valve regurgitation. No evidence of mitral stenosis.   4. The aortic valve is calcified. Aortic valve regurgitation is not visualized. Moderate to severe aortic stenosis (peak velocity 3.62m/s, MG , AVA VTI 0.98cm2, DI 0.24, SVi 39).     Neuro/Psych glaucoma    GI/Hepatic negative GI ROS, Neg liver ROS,,,  Endo/Other  BMI 30.6  Renal/GU negative Renal ROS   Prostate cancer    Musculoskeletal  (+) Arthritis ,    Abdominal   Peds  Hematology Hb 16.6, plt 206k   Anesthesia Other Findings   Reproductive/Obstetrics                              Anesthesia Physical Anesthesia Plan  ASA: 3  Anesthesia Plan: MAC   Post-op Pain Management: Tylenol  PO (pre-op)*   Induction:   PONV Risk Score and Plan: 1 and Treatment may vary due to age or medical condition  Airway Management Planned: Natural Airway and Simple  Face Mask  Additional Equipment: None  Intra-op Plan:   Post-operative Plan:   Informed Consent: I have reviewed the patients History and Physical, chart, labs and discussed the procedure including the risks, benefits and alternatives for the proposed anesthesia with the patient or authorized representative who has indicated his/her understanding and acceptance.     Dental advisory given  Plan Discussed with: CRNA and Surgeon  Anesthesia Plan Comments:          Anesthesia Quick Evaluation  "

## 2024-08-13 NOTE — Progress Notes (Signed)
" °  Echocardiogram 2D Echocardiogram has been performed.  Shandora Koogler 08/13/2024, 3:21 PM "

## 2024-08-13 NOTE — Op Note (Signed)
 " [facility]   MULTIDISCIPLINARY HEART VALVE TEAM   TAVR OPERATIVE NOTE   Date of Procedure:  08/13/2024  Preoperative Diagnosis: Severe Aortic Stenosis   Postoperative Diagnosis: Same   Procedure:   Transcatheter Aortic Valve Replacement - Percutaneous Transfemoral Approach  Edwards Sapien 3 Ultra Resilia (size 29 mm, model # 9755RSL, serial # [id number]  )   Co-Surgeons:  Con Clunes, MD and Lonni Cash, MD   Anesthesiologist:  Leonce, MD  Echocardiographer:  Delford, MD  Pre-operative Echo Findings: Severe aortic stenosis Normal left ventricular systolic function  Post-operative Echo Findings: No paravalvular leak Normal left ventricular systolic function   BRIEF CLINICAL NOTE AND INDICATIONS FOR SURGERY  [name] is a 79 y.o. male presents for evaluation of severe aortic stenosis and TAVR.  He has been followed for almost 10 years for his aortic stenosis, which has gradually progressed to severe.  He has exertional dyspnea with walking up hill or walking quickly.  He was previously walking a mile 3-4 times a week, but hasn't been able to do it for the last 6 months due to fatigue and dyspnea.   He also had lightheadness, especially with bending over.  He denies palpitations or leg swelling. He is in the process of selling his home and moving into a retirement center.  He is eager to proceed with TAVR in order to improve his quality of life.  DETAILS OF THE OPERATIVE PROCEDURE  PREPARATION:    The patient was brought to the operating room on the above mentioned date and appropriate monitoring was established by the anesthesia team. The patient was placed in the supine position on the operating table.  Intravenous antibiotics were administered. The patient was monitored closely throughout the procedure under conscious sedation.  Baseline transthoracic echocardiogram was performed. The patient's abdomen and both groins were prepped and  draped in a sterile manner. A time out procedure was performed.   PERIPHERAL ACCESS:    Using the modified Seldinger technique, femoral arterial and venous access was obtained with placement of 6 Fr sheaths on the left side.  A pigtail diagnostic catheter was passed through the left arterial sheath under fluoroscopic guidance into the aortic root.  A temporary transvenous pacemaker catheter was passed through the left femoral venous sheath under fluoroscopic guidance into the right ventricle.  The pacemaker was tested to ensure stable lead placement and pacemaker capture. Aortic root angiography was performed in order to determine the optimal angiographic angle for valve deployment.   TRANSFEMORAL ACCESS:   Percutaneous transfemoral access and sheath placement was performed using ultrasound guidance.  The right common femoral artery was cannulated using a micropuncture needle and appropriate location was verified using fluoroscopy.  A pair of Abbott Perclose percutaneous closure devices were placed and a 6 French sheath replaced into the femoral artery.  The patient was heparinized systemically and ACT verified > 250 seconds.    A 16 Fr transfemoral E-sheath was introduced into the right common femoral artery after progressively dilating over an Amplatz superstiff wire. An AL-2 catheter was used to direct a straight-tip exchange length wire across the native aortic valve into the left ventricle. This was exchanged out for a pigtail catheter and position was confirmed in the LV apex. Simultaneous LV and Ao pressures were recorded.  The LVEDP was 8 mmHg.  The pigtail catheter was exchanged for a Safari wire in the LV apex.   TRANSCATHETER HEART VALVE DEPLOYMENT:   An Edwards Sapien 3 Ultra  transcatheter heart valve (29 mm) was prepared and crimped per manufacturer's guidelines, and the proper orientation of the valve was confirmed on the [employer] delivery system. The valve was advanced through  the introducer sheath using normal technique until in an appropriate position in the abdominal aorta beyond the sheath tip. The balloon was then retracted and using the fine-tuning wheel was centered on the valve. The valve was then advanced across the aortic arch using appropriate flexion of the catheter. The valve was carefully positioned across the aortic valve annulus. The Commander catheter was retracted using normal technique. Once final position of the valve was confirmed by angiographic assessment, the valve was deployed during rapid ventricular pacing to maintain systolic blood pressure < 50 mmHg and pulse pressure < 10 mmHg. The balloon inflation was held for >3 seconds after reaching full deployment volume. Once the balloon was fully deflated the balloon was retracted into the ascending aorta and valve function was assessed using echocardiography. There was felt to be no paravalvular leak and no central aortic insufficiency.  The patient's hemodynamic recovery following valve deployment was good.  The deployment balloon and guidewire were both removed.    PROCEDURE COMPLETION:   The sheath was removed and femoral artery closure performed.  Protamine  was administered once femoral arterial repair was complete. The temporary pacemaker, pigtail catheter and femoral sheaths were removed with manual pressure used for venous hemostasis.  A Mynx femoral closure device was utilized following removal of the diagnostic sheath in the left femoral artery.  The patient tolerated the procedure well and was transported to the cath lab recovery area in stable condition. There were no immediate intraoperative complications. All sponge instrument and needle counts were verified correct at completion of the operation.   No blood products were administered during the operation.  The patient received a total of 45 mL of intravenous contrast during the procedure.   Con GORMAN Clunes, MD 08/13/2024 5:53 PM      "

## 2024-08-13 NOTE — Anesthesia Postprocedure Evaluation (Signed)
"   Anesthesia Post Note  Patient: [name]  Procedure(s) Performed: Transcatheter Aortic Valve Replacement, Transfemoral ECHOCARDIOGRAM, TRANSTHORACIC     Patient location during evaluation: Cath Lab Anesthesia Type: MAC Level of consciousness: awake and alert, oriented and patient cooperative Pain management: pain level controlled Vital Signs Assessment: post-procedure vital signs reviewed and stable Respiratory status: spontaneous breathing, nonlabored ventilation and respiratory function stable Cardiovascular status: blood pressure returned to baseline and stable Postop Assessment: no apparent nausea or vomiting Anesthetic complications: no   There were no known notable events for this encounter.  Last Vitals:  Vitals:   08/13/24 1630 08/13/24 1658  BP: 97/70   Pulse: (!) 55   Resp: 18   Temp:  (!) 36.3 C  SpO2: 90%     Last Pain:  Vitals:   08/13/24 1658  TempSrc: Oral  PainSc: 0-No pain                 Jovin Fester,E. Tazaria Dlugosz      "

## 2024-08-13 NOTE — Interval H&P Note (Signed)
 History and Physical Interval Note:  08/13/2024 1:30 PM  [name]  has presented today for surgery, with the diagnosis of Severe Aortic Stenosis.  The various methods of treatment have been discussed with the patient and family. After consideration of risks, benefits and other options for treatment, the patient has consented to  Procedures: Transcatheter Aortic Valve Replacement, Transfemoral (N/A) ECHOCARDIOGRAM, TRANSTHORACIC (N/A) as a surgical intervention.  The patient's history has been reviewed, patient examined, no change in status, stable for surgery.  I have reviewed the patient's chart and labs.  Questions were answered to the patient's satisfaction.     Con RAMAN Rithwik Schmieg

## 2024-08-13 NOTE — Plan of Care (Signed)

## 2024-08-13 NOTE — Transfer of Care (Signed)
 Immediate Anesthesia Transfer of Care Note  Patient: [name]  Procedure(s) Performed: Transcatheter Aortic Valve Replacement, Transfemoral ECHOCARDIOGRAM, TRANSTHORACIC  Patient Location: PACU and Cath Lab  Anesthesia Type:MAC  Level of Consciousness: awake and alert   Airway & Oxygen Therapy: Patient Spontanous Breathing and Patient connected to nasal cannula oxygen  Post-op Assessment: Report given to RN and Post -op Vital signs reviewed and stable  Post vital signs: Reviewed and stable  Last Vitals:  Vitals Value Taken Time  BP 109/83 08/13/24  1543  Temp    Pulse 72 08/13/24 15:42  Resp 18 08/13/24 15:42  SpO2 95 % 08/13/24 15:42  Vitals shown include unfiled device data.  Last Pain:  Vitals:   08/13/24 1056  TempSrc:   PainSc: 0-No pain         Complications: There were no known notable events for this encounter.

## 2024-08-14 ENCOUNTER — Inpatient Hospital Stay (HOSPITAL_COMMUNITY)

## 2024-08-14 ENCOUNTER — Encounter (HOSPITAL_COMMUNITY): Payer: Self-pay | Admitting: Cardiovascular Disease

## 2024-08-14 DIAGNOSIS — Z952 Presence of prosthetic heart valve: Secondary | ICD-10-CM

## 2024-08-14 LAB — ECHOCARDIOGRAM COMPLETE
AR max vel: 1.85 cm2
AV Area VTI: 2.02 cm2
AV Area mean vel: 1.85 cm2
AV Mean grad: 6.5 mmHg
AV Peak grad: 11.2 mmHg
Ao pk vel: 1.67 m/s
Area-P 1/2: 3.77 cm2
Height: 73 in
S' Lateral: 3.7 cm
Weight: 3682.56 [oz_av]

## 2024-08-14 LAB — CBC
HCT: 44.7 % (ref 39.0–52.0)
Hemoglobin: 15.5 g/dL (ref 13.0–17.0)
MCH: 30.2 pg (ref 26.0–34.0)
MCHC: 34.7 g/dL (ref 30.0–36.0)
MCV: 87 fL (ref 80.0–100.0)
Platelets: 144 10*3/uL — ABNORMAL LOW (ref 150–400)
RBC: 5.14 MIL/uL (ref 4.22–5.81)
RDW: 12.9 % (ref 11.5–15.5)
WBC: 10 10*3/uL (ref 4.0–10.5)
nRBC: 0 % (ref 0.0–0.2)

## 2024-08-14 LAB — BASIC METABOLIC PANEL WITH GFR
Anion gap: 7 (ref 5–15)
BUN: 23 mg/dL (ref 8–23)
CO2: 25 mmol/L (ref 22–32)
Calcium: 8.8 mg/dL — ABNORMAL LOW (ref 8.9–10.3)
Chloride: 105 mmol/L (ref 98–111)
Creatinine, Ser: 0.98 mg/dL (ref 0.61–1.24)
GFR, Estimated: >60 mL/min
Glucose, Bld: 92 mg/dL (ref 70–99)
Potassium: 4.9 mmol/L (ref 3.5–5.1)
Sodium: 137 mmol/L (ref 135–145)

## 2024-08-14 LAB — POCT ACTIVATED CLOTTING TIME: Activated Clotting Time: 312 s

## 2024-08-14 NOTE — Progress Notes (Signed)
 Mobility Specialist Progress Note;   08/14/24 0855  Mobility  Activity Ambulated with assistance  Level of Assistance Standby assist, set-up cues, supervision of patient - no hands on  Assistive Device Other (Comment) (IV pole)  Distance Ambulated (ft) 400 ft  Activity Response Tolerated well  Mobility Referral Yes  Mobility visit 1 Mobility  Mobility Specialist Start Time (ACUTE ONLY) 0855  Mobility Specialist Stop Time (ACUTE ONLY) 0908  Mobility Specialist Time Calculation (min) (ACUTE ONLY) 13 min   Pt in chair upon arrival, agreeable to mobility. Required no physical assistance during ambulation, SV for safety. HR up to 128 bpm w/ activity, pt asx throughout. No bleeding when checked. Requested to use BR at [facility]. Pt left with all needs met. Daughter and RN present.   Lauraine Erm Mobility Specialist Please contact via SecureChat or [employer] [phone]

## 2024-08-14 NOTE — Discharge Instructions (Signed)

## 2024-08-14 NOTE — Progress Notes (Signed)
 DISCHARGE NOTE HOME [name] to be discharged Home per MD order. Discussed prescriptions and follow up appointments with the patient. Prescriptions given to patient; medication list explained in detail. Patient verbalized understanding.  Skin clean, dry and intact without evidence of skin break down, no evidence of skin tears noted. IV catheter discontinued intact. Site without signs and symptoms of complications. Dressing and pressure applied. Pt denies pain at the site currently. No complaints noted.  Patient free of lines, drains, and wounds.   An After Visit Summary (AVS) was printed and given to the patient. Patient escorted via wheelchair, and discharged home via private auto.  [name], RN

## 2024-08-14 NOTE — Progress Notes (Addendum)
 CARDIAC REHAB PHASE I   Post TAVR education including site care, restrictions, risk factors, heart healthy diet, exercise guidelines, and CRP2 reviewed. At this time, patient is not interested in cardiac rehab. Patient is active and has facility within community where he prefers to do physical activity. All questions and concerns addressed.   [id number] Jack JAYSON Liverpool, RN BSN 08/14/2024 9:56 AM

## 2024-08-15 ENCOUNTER — Telehealth: Payer: Self-pay

## 2024-08-15 ENCOUNTER — Telehealth: Payer: Self-pay | Admitting: *Deleted

## 2024-08-15 NOTE — Patient Instructions (Signed)
 Visit Information  Thank you for taking time to visit with me today. Please don't hesitate to contact me if I can be of assistance to you before our next scheduled telephone appointment.  Our next appointment is by telephone on Wednesday August 20, 2024 at 3:30 pm  Please call the care guide team at [phone] if you need to cancel or reschedule your appointment.   Patient Self Care Activities:  Attend all scheduled provider appointments Call provider office for new concerns or questions  Participate in Transition of Care Program/Attend TOC scheduled calls Take medications as prescribed   Pace activity as your recuperation from recent surgery continues If you believe your condition is getting worse- contact your care providers (doctors) promptly- reaching out to your doctor early when you have concerns can prevent you from having to go to the hospital  Following is a copy of your care plan:   Goals Addressed             This Visit's Progress    VBCI Transitions of Care (TOC) Care Plan       Problems:  Recent hospitalization January 28-29, 2026 for planned surgical TAVR secondary to severe aortic stenosis Independent; resides with supportive spouse; drives self at baseline; low fall risk: no use of assistive devices; planning to move to [employer] next month; retired biomedical scientist with healthcare background 0 unplanned hospital admissions x > 12 months  Goal:  Over the next 30 days, the patient will not experience hospital readmission  Interventions:  Transitions of Care: week # 1- day # 1 Durable Medical Equipment (DME) needs assessed with patient/caregiver and reviewed with patient/caregiver Doctor Visits  - discussed the importance of doctor visits Communication with PCP re: successful enrollment into [facility] 30-day program Post discharge activity limitations prescribed by provider reviewed Post-op wound/incision care reviewed with patient/caregiver Reviewed Signs and  symptoms of infection Reviewed upcoming provider office visits: 08/21/24- cardiology surgical provider; confirmed patient is aware of all and has plans to attend as scheduled Provided my direct contact information should questions/ concerns/ needs arise post-TOC initial call, prior to next [facility] 30-day program RN CM telephone visit    Surgery (planned surgical TAVR): Evaluation of current treatment plan related to TAVR surgery assessed patient/caregiver understanding of surgical procedure   reviewed post-operative instructions with patient/caregiver addressed questions about post - surgical incision care  reviewed medications with patient and addressed questions reviewed scheduled provider appointments with patient  confirmed availability of transportation to all appointments   performed PHQ2-PHQ9 assessment     Patient Self Care Activities:  Attend all scheduled provider appointments Call provider office for new concerns or questions  Participate in Transition of Care Program/Attend TOC scheduled calls Take medications as prescribed   Pace activity as your recuperation from recent surgery continues If you believe your condition is getting worse- contact your care providers (doctors) promptly- reaching out to your doctor early when you have concerns can prevent you from having to go to the hospital  Plan:  Telephone follow up appointment with care management team member scheduled for:  08/20/24       Care plan and visit instructions communicated with the patient verbally today. Patient agrees to receive a copy in MyChart. Active MyChart status and patient understanding of how to access instructions and care plan via MyChart confirmed with patient.     If you are experiencing a Mental Health or Behavioral Health Crisis or need someone to talk to, please  call the Suicide and  Crisis Lifeline: 988 call the [place]  National Suicide Prevention Lifeline: 343-597-4205 or TTY: (218)321-3847 TTY  616 066 8250) to talk to a trained counselor call 1-800-273-TALK (toll free, 24 hour hotline) go to [facility] [address] [place] [phone]) call the [facility] Line: [phone] call 911   Pls call for questions,  Nalah Macioce Mckinney Jayland Null, RN, BSN, CCRN Alumnus RN Care Manager  Transitions of Care  [facility]I - [facility] [phone]: direct office

## 2024-08-15 NOTE — Transitions of Care (Post Inpatient/ED Visit) (Signed)
 "  08/15/2024  Name: [name] MRN: [id number] DOB: 1945/12/12  Today's TOC FU Call Status: Today's TOC FU Call Status:: Successful TOC FU Call Completed TOC FU Call Complete Date: 08/15/24  Patient's Name and Date of Birth confirmed. Name, DOB  Transition Care Management Follow-up Telephone Call Date of Discharge: 08/14/24 Discharge Facility: [facility]) Type of Discharge: Inpatient Admission Primary Inpatient Discharge Diagnosis:: Planned surgical TAVR for severe aortic stenosis How have you been since you were released from the hospital?: Better (I am doing fine; not having any problems at all. The groin sites look fine- no drainage, no bleeding, no swelling, no pain- just a little sore.  No concerns on my end) Any questions or concerns?: No  Items Reviewed: Did you receive and understand the discharge instructions provided?: Yes (thoroughly reviewed with patient who verbalizes good understanding of same) Medications obtained,verified, and reconciled?: Yes (Medications Reviewed) (Full medication reconciliation/ review completed; no concerns or discrepancies identified; confirmed no newly Rx'd medications at hospital discharge; self-manages medications and denies questions/ concerns around medications today) Any new allergies since your discharge?: No Dietary orders reviewed?: Yes Type of Diet Ordered:: We eat very healthy- low fat, low salt, fresh as much as possible Do you have support at home?: Yes People in Home [RPT]: spouse Name of Support/Comfort Primary Source: Reports independent in self-care activities; resides with supportive spouse- assists as/ if needed/ indicated  Medications Reviewed Today: Medications Reviewed Today     Reviewed by Marylee Belzer M, RN (Registered Nurse) on 08/15/24 at 1113  Med List Status: <None>   Medication Order Taking? Sig Documenting Provider Last Dose Status Informant  Albuterol-Budesonide (AIRSUPRA ) 90-80 MCG/ACT AERO  [id number] Yes Inhale 2 puffs into the lungs every 4 (four) hours as needed (wheezing). [name], FNP  Active Self  aspirin  81 MG tablet [id number] Yes Take 81 mg by mouth daily. [provider]  Active Self  ibuprofen (ADVIL) 200 MG tablet [id number] Yes Take 200 mg by mouth every 6 (six) hours as needed for moderate pain (pain score 4-6). [provider]  Active Self  simvastatin  (ZOCOR ) 20 MG tablet [id number] Yes Take 1 tablet (20 mg total) by mouth at bedtime. Jason Leita Repine, FNP  Active Self  sodium chloride  (OCEAN) 0.65 % SOLN nasal spray [id number] Yes Place 1 spray into both nostrils as needed for congestion. [provider]  Active Self  telmisartan  (MICARDIS ) 80 MG tablet [id number] Yes TAKE 1 TABLET BY MOUTH EVERY DAY Nahser, Aleene PARAS, MD  Active Self  timolol  (TIMOPTIC ) 0.5 % ophthalmic solution [id number] Yes Place 1 drop into the right eye 2 (two) times daily. [provider]  Active Self           Home Care and Equipment/Supplies: Were Home Health Services Ordered?: No Any new equipment or medical supplies ordered?: No  Functional Questionnaire: Do you need assistance with bathing/showering or dressing?: No Do you need assistance with meal preparation?: No Do you need assistance with eating?: No Do you have difficulty maintaining continence: No Do you need assistance with getting out of bed/getting out of a chair/moving?: No Do you have difficulty managing or taking your medications?: No  Follow up appointments reviewed: PCP Follow-up appointment confirmed?: No (Patient declined scheduling Hospital follow up office visit with PCP by [facility] RN CM in real-time: I don't need to see my PCP- it was planned overnight surgery and the surgeon just said I needed to see them after it was done) MD  Provider Line Number:[phone] Given: No (verified well-established with current PCP) Specialist Hospital Follow-up appointment  confirmed?: Yes Date of Specialist follow-up appointment?: 08/21/24 Follow-Up Specialty Provider:: Surgical cardiology provider: post op visit Do you need transportation to your follow-up appointment?: No Do you understand care options if your condition(s) worsen?: Yes-patient verbalized understanding  SDOH Interventions Today    Flowsheet Row Most Recent Value  SDOH Interventions   Food Insecurity Interventions Intervention Not Indicated  Housing Interventions Intervention Not Indicated  Transportation Interventions Intervention Not Indicated  [drives self at baseline,  spouse assists as- if indicated]  Utilities Interventions Intervention Not Indicated   See TOC assessment tabs/ care plan for additional assessment/ TOC intervention information  I appreciate the opportunity to participate in [name]'s care,  Pls call/ message for questions,  Faron Tudisco Mckinney Amelia Burgard, RN, BSN, Media Planner  Transitions of Care  [facility]I - [facility] [phone]: direct office  "

## 2024-08-15 NOTE — Telephone Encounter (Signed)
 Patient contacted regarding discharge from [facility] on 08/14/24  Patient understands to follow up with provider Izetta Hummer, PA-C on 08/21/24 at 3:35PM at [address] Location. Patient understands discharge instructions? Yes Patient understands medications and regiment? Yes Patient understands to bring all medications to this visit? Yes

## 2024-08-20 ENCOUNTER — Other Ambulatory Visit: Payer: Self-pay | Admitting: *Deleted

## 2024-08-20 NOTE — Transitions of Care (Post Inpatient/ED Visit) (Signed)
 " Transition of Care week 2/ day # 6  Visit Note  08/20/2024  Name: [name] MRN: [id number]          DOB: 1946/05/23  Situation: Patient enrolled in [facility] 30-day program. Visit completed with patient by telephone  HIPAA identifiers x 2 verified  Background:  Recent hospitalization January 28-29, 2026 for planned surgical TAVR secondary to severe aortic stenosis Independent; resides with supportive spouse; drives self at baseline; low fall risk: no use of assistive devices; planning to move to [place] next month; retired biomedical scientist with healthcare background 0 unplanned hospital admissions x > 12 months  Initial Transition Care Management Follow-up Telephone Call Discharge Date and Diagnosis: 08/14/24, Planned surgical TAVR for severe aortic stenosis   Past Medical History:  Diagnosis Date   Arthritis    ARTHRITIS 02/25/2008   Qualifier: Diagnosis of  By: Claudene GUSS Railing     DEGENERATIVE JOINT DISEASE, BOTH KNEES, SEVERE 02/17/2010   Qualifier: Diagnosis of  By: Caprice Leader     Diverticulosis of large intestine 01/07/2009   Qualifier: Diagnosis of  By: Tish MD, William   Colonoscopy 2009;Dr  Kaplan,[facility]     Erectile dysfunction    Esophagitis 07/26/2007   Qualifier: Diagnosis of  By: Tish MD, Elsie Peels    OD only; from remote trauma (open angle)   GLAUCOMA, PRIMARY OPEN-ANGLE 02/25/2008   Qualifier: Diagnosis of  By: Claudene GUSS Railing   OD only;Dr Thresa Bracket, [facility]     Hyperlipidemia    Hypertension    Prostate cancer (HCC) 10/16/2012   Adenocarcinoma   S/P TAVR (transcatheter aortic valve replacement) 08/13/2024   S/p TAVR with a 29 mm Edwards Sapien 3 Ultra Resilia THV via the TF approach by Dr. Verlin and Dr. Daniel   Severe aortic stenosis    Assessment: I am doing fine; no problems at all- going to the post-op visit tomorrow with the cardiologist- hoping to get cleared for starting back driving and maybe some increased  activity too.  No falls, no pain and the surgical sites by my groins both look fine  Patient Reported Symptoms: Cognitive Cognitive Status: Alert and oriented to person, place, and time, Normal speech and language skills, Insightful and able to interpret abstract concepts Cognitive/Intellectual Conditions Management [RPT]: None reported or documented in medical history or problem list      Neurological Neurological Review of Symptoms: No symptoms reported    HEENT HEENT Symptoms Reported: No symptoms reported      Cardiovascular Cardiovascular Symptoms Reported: No symptoms reported Does patient have uncontrolled Hypertension?: No Cardiovascular Management Strategies: Routine screening, Medication therapy, Coping strategies, Adequate rest, Diet modification, Activity  Respiratory Respiratory Symptoms Reported: No symptoms reported Other Respiratory Symptoms: Denies shortness of breath and sounds to be in no respiratory distress throughout TOC call; confirmed no need/ recent use of rescue inhaler Respiratory Management Strategies: Asthma action plan, Adequate rest, Coping strategies, Medication therapy, Routine screening  Endocrine Endocrine Symptoms Reported: No symptoms reported Is patient diabetic?: No    Gastrointestinal Gastrointestinal Symptoms Reported: No symptoms reported      Genitourinary Genitourinary Symptoms Reported: No symptoms reported    Integumentary Integumentary Symptoms Reported: Incision Additional Integumentary Details: The incisions at my groin still look great- no real soreness, no drainage or swelling or redness- going for my post-op check at the cardiologists office tomorrow as scheduled Skin Management Strategies: Routine screening, Coping strategies, Dressing changes  Musculoskeletal Musculoskelatal Symptoms Reviewed: No  symptoms reported Additional Musculoskeletal Details: confirmed not currently requiring/ using assistive devices for ambulation as per  baseline; confirms no new- recent falls since last [facility] outreach last week     Patient at Risk for Falls Due to: No Fall Risks Fall risk Follow up: Falls prevention discussed  Psychosocial Psychosocial Symptoms Reported: No symptoms reported Behavioral Management Strategies: Support system, Coping strategies Major Change/Loss/Stressor/Fears (CP): Denies Techniques to Cope with Loss/Stress/Change: Diversional activities Quality of Family Relationships: involved, helpful, supportive   There were no vitals filed for this visit. Pain Scale: 0-10 Pain Score: 0-No pain  Medications Reviewed Today     Reviewed by Lamark Schue M, RN (Registered Nurse) on 08/20/24 at 1535  Med List Status: <None>   Medication Order Taking? Sig Documenting Provider Last Dose Status Informant  Albuterol-Budesonide (AIRSUPRA ) 90-80 MCG/ACT AERO [id number]  Inhale 2 puffs into the lungs every 4 (four) hours as needed (wheezing). [name], FNP  Active Self  aspirin  81 MG tablet [id number]  Take 81 mg by mouth daily. [provider]  Active Self  ibuprofen (ADVIL) 200 MG tablet [id number]  Take 200 mg by mouth every 6 (six) hours as needed for moderate pain (pain score 4-6). [provider]  Active Self  simvastatin  (ZOCOR ) 20 MG tablet [id number]  Take 1 tablet (20 mg total) by mouth at bedtime. Jason Leita Repine, FNP  Active Self  sodium chloride  (OCEAN) 0.65 % SOLN nasal spray [id number]  Place 1 spray into both nostrils as needed for congestion. [provider]  Active Self  telmisartan  (MICARDIS ) 80 MG tablet [id number]  TAKE 1 TABLET BY MOUTH EVERY DAY Nahser, Aleene PARAS, MD  Active Self  timolol  (TIMOPTIC ) 0.5 % ophthalmic solution [id number]  Place 1 drop into the right eye 2 (two) times daily. [provider]  Active Self           Recommendation:   Specialty provider follow-up: cardiology post-op, as scheduled 08/21/24 Continue Current Plan of Care  Follow Up  Plan:   Telephone follow-up in 1 week- as scheduled per patient preference  I appreciate the opportunity to participate in [name]'s care,   Pls call/ message for questions,  Tiesha Marich Mckinney Amon Costilla, RN, BSN, CCRN Alumnus RN Care Manager  Transitions of Care  [facility] - [facility] [phone]: direct office     "

## 2024-08-20 NOTE — Patient Instructions (Signed)
 Visit Information  Thank you for taking time to visit with me today. Please don't hesitate to contact me if I can be of assistance to you before our next scheduled telephone appointment.  Our next appointment is by telephone on Wednesday August 27, 2024 at 3:30 pm  Please call the care guide team at [phone] if you need to cancel or reschedule your appointment.   Following are the goals we discussed today:  Patient Self Care Activities:  Attend all scheduled provider appointments Call provider office for new concerns or questions  Participate in Transition of Care Program/Attend TOC scheduled calls Take medications as prescribed   Pace activity as your recuperation from recent surgery continues If you believe your condition is getting worse- contact your care providers (doctors) promptly- reaching out to your doctor early when you have concerns can prevent you from having to go to the hospital  If you are experiencing a Mental Health or Behavioral Health Crisis or need someone to talk to, please  call the Suicide and Crisis Lifeline: 988 call the [place]  National Suicide Prevention Lifeline: (717)720-7542 or TTY: 939 204 2844 TTY 580-340-0943) to talk to a trained counselor call 1-800-273-TALK (toll free, 24 hour hotline) go to [facility] [address], [place] [phone]) call the [facility] Crisis Line: 934-227-2210 call 911   Care plan and visit instructions communicated with the patient verbally today. Patient agrees to receive a copy in MyChart. Active MyChart status and patient understanding of how to access instructions and care plan via MyChart confirmed with patient.     Pls call for questions,  Leslyn Monda Mckinney Presley Summerlin, RN, BSN, CCRN Alumnus RN Care Manager  Transitions of Care  [facility] - [facility] [phone]: direct office

## 2024-08-21 ENCOUNTER — Other Ambulatory Visit (HOSPITAL_COMMUNITY): Payer: Self-pay

## 2024-08-21 ENCOUNTER — Ambulatory Visit: Admitting: Physician Assistant

## 2024-08-21 VITALS — BP 118/80 | HR 70 | Ht 73.0 in | Wt 231.2 lb

## 2024-08-21 DIAGNOSIS — I10 Essential (primary) hypertension: Secondary | ICD-10-CM

## 2024-08-21 DIAGNOSIS — B3789 Other sites of candidiasis: Secondary | ICD-10-CM

## 2024-08-21 DIAGNOSIS — Z952 Presence of prosthetic heart valve: Secondary | ICD-10-CM

## 2024-08-21 DIAGNOSIS — E782 Mixed hyperlipidemia: Secondary | ICD-10-CM

## 2024-08-21 MED ORDER — NYSTATIN 100000 UNIT/GM EX OINT
1.0000 | TOPICAL_OINTMENT | Freq: Two times a day (BID) | CUTANEOUS | 3 refills | Status: AC
  Filled 2024-08-21: qty 15, 30d supply, fill #0

## 2024-08-21 MED ORDER — AZITHROMYCIN 500 MG PO TABS
500.0000 mg | ORAL_TABLET | ORAL | 12 refills | Status: AC
  Filled 2024-08-21: qty 6, 6d supply, fill #0

## 2024-08-21 NOTE — Patient Instructions (Signed)
 Medication Instructions:  Your physician has recommended you make the following change in your medication:  START nystatin  cream, applying to affected area twice a day until clear.  START Zithromax  by mouth as directed 1 hour before any dental work including cleanings. *If you need a refill on your cardiac medications before your next appointment, please call your pharmacy*  Lab Work: None needed If you have labs (blood work) drawn today and your tests are completely normal, you will receive your results only by: MyChart Message (if you have MyChart) OR A paper copy in the mail If you have any lab test that is abnormal or we need to change your treatment, we will call you to review the results.  Testing/Procedures: 09/18/2024 Your physician has requested that you have an echocardiogram. Echocardiography is a painless test that uses sound waves to create images of your heart. It provides your doctor with information about the size and shape of your heart and how well your hearts chambers and valves are working. This procedure takes approximately one hour. There are no restrictions for this procedure. Please do NOT wear cologne, perfume, aftershave, or lotions (deodorant is allowed). Please arrive 15 minutes prior to your appointment time.  Please note: We ask at that you not bring children with you during ultrasound (echo/ vascular) testing. Due to room size and safety concerns, children are not allowed in the ultrasound rooms during exams. Our front office staff cannot provide observation of children in our lobby area while testing is being conducted. An adult accompanying a patient to their appointment will only be allowed in the ultrasound room at the discretion of the ultrasound technician under special circumstances. We apologize for any inconvenience.   Follow-Up: At [facility], you and your health needs are our priority.  As part of our continuing mission to provide you with  exceptional heart care, our providers are all part of one team.  This team includes your primary Cardiologist (physician) and Advanced Practice Providers or APPs (Physician Assistants and Nurse Practitioners) who all work together to provide you with the care you need, when you need it.  Your next appointment:   As scheduled on 09/18/2024  Provider:   Izetta Hummer, PA-C  We recommend signing up for the patient portal called MyChart.  Sign up information is provided on this After Visit Summary.  MyChart is used to connect with patients for Virtual Visits (Telemedicine).  Patients are able to view lab/test results, encounter notes, upcoming appointments, etc.  Non-urgent messages can be sent to your provider as well.   To learn more about what you can do with MyChart, go to [email or web address].

## 2024-08-21 NOTE — Progress Notes (Signed)
 " HEART AND VASCULAR CENTER   MULTIDISCIPLINARY HEART VALVE CLINIC                                     Cardiology Office Note:    Date:  08/21/2024   ID:  [name], DOB February 15, 1946, MRN [id number]  PCP:  [name], FNP  [facility] Cardiologist:  Ozell Fell, MD  [facility] Structural heart: Lonni Cash, MD [facility] Electrophysiologist:  None   Referring MD: [name], FNP   [facility] s/p TAVR  History of Present Illness:    [name] is a 79 y.o. male with a hx of prostate cancer s/p radical prostatectomy, 1st deg AV block/LAFB and severe aortic stenosis s/p TAVR (08/13/24) who presents to clinic for follow up.   Pt reported exertional dyspnea with walking uphill or at a brisk pace. Echo 09/2023 showed LVEF 60-65%, normal RV function, and moderately severe aortic stenosis with mean gradient of 30 mmHg, V-max of 3.6 m/s, AVA 0.8, DVI 0.29, SVI 26. LHC 06/19/24 showed mild non obst CAD with severe ostial PDA stenosis. S/p TAVR with a 29 mm Edwards Sapien 3 Ultra Resilia THV via the TF approach on 08/13/24. S/p TAVR with a 29 mm Edwards Sapien 3 Ultra Resilia THV via the TF approach on 08/13/24. Post operative echo showed EF 60%, mod RVE, normally functioning TAVR with a mean gradient of 6.5 mmHg and no PVL.  Today the patient presents to clinic for follow up. No CP or SOB. No LE edema, orthopnea or PND. No dizziness or syncope. No blood in stool or urine. No palpitations. He can tell a difference in his breathing and energy. Still has a dry cough that has been ongoing since before TAVR. Worse when standing up and better supine.     Past Medical History:  Diagnosis Date   Arthritis    ARTHRITIS 02/25/2008   Qualifier: Diagnosis of  By: Claudene BANANA, Heron     DEGENERATIVE JOINT DISEASE, BOTH KNEES, SEVERE 02/17/2010   Qualifier: Diagnosis of  By: Caprice Leader     Diverticulosis of large intestine 01/07/2009   Qualifier: Diagnosis  of  By: Tish MD, William   Colonoscopy 2009;Dr  Kaplan,[facility]     Erectile dysfunction    Esophagitis 07/26/2007   Qualifier: Diagnosis of  By: Tish MD, Elsie Peels    OD only; from remote trauma (open angle)   GLAUCOMA, PRIMARY OPEN-ANGLE 02/25/2008   Qualifier: Diagnosis of  By: Claudene BANANA Heron   OD only;Dr Thresa Bracket, [facility]     Hyperlipidemia    Hypertension    Prostate cancer (HCC) 10/16/2012   Adenocarcinoma   S/P TAVR (transcatheter aortic valve replacement) 08/13/2024   S/p TAVR with a 29 mm Edwards Sapien 3 Ultra Resilia THV via the TF approach by Dr. Cash and Dr. Daniel   Severe aortic stenosis      Current Medications: Active Medications[1]    ROS:   Please see the history of present illness.    All other systems reviewed and are negative.  EKGs   EKG Interpretation Date/Time:  Thursday August 21 2024 15:36:07 EST Ventricular Rate:  70 PR Interval:  234 QRS Duration:  80 QT Interval:  384 QTC Calculation: 414 R Axis:   -30  Text Interpretation: Sinus rhythm with marked sinus arrhythmia with 1st degree A-V block  Left axis deviation Anterior infarct , age undetermined When compared with ECG of 14-Aug-2024 04:03, Premature atrial complexes are no longer Present Nonspecific T wave abnormality no longer evident in Lateral leads Confirmed by Sebastian Pagan [id number]) on 08/21/2024 4:15:09 PM   Risk Assessment/Calculations:           Physical Exam:    VS:  BP 118/80   Pulse 70   Ht 6' 1 (1.854 m)   Wt 231 lb 3.2 oz (104.9 kg)   SpO2 95%   BMI 30.50 kg/m     Wt Readings from Last 3 Encounters:  08/21/24 231 lb 3.2 oz (104.9 kg)  08/14/24 230 lb 2.6 oz (104.4 kg)  07/29/24 235 lb 9.6 oz (106.9 kg)     GEN: Well nourished, well developed in no acute distress NECK: No JVD CARDIAC: RRR, no murmurs, rubs, gallops RESPIRATORY:  Clear to auscultation without rales, wheezing or rhonchi  ABDOMEN: Soft, non-tender,  non-distended EXTREMITIES:  No edema; No deformity.  Groin sites clear without hematoma or ecchymosis. Red yeasty area in left groin.   ASSESSMENT:    1. S/P TAVR (transcatheter aortic valve replacement)   2. Essential hypertension   3. HYPERLIPIDEMIA   4. Candida rash of groin     PLAN:    In order of problems listed above:  Severe AS s/p TAVR:  -- Pt doing excellent s/p TAVR.  -- ECG with no HAVB.  -- Groin sites healing well.  -- SBE prophylaxis discussed; I have RX'd azithromycin  due to a PCN allergy.  -- Continue Aspirin  81mg  daily. -- Cleared to resume all activities without restriction. -- I will see back for 1 month echo and OV.  HTN: -- BP well controlled.   -- Continue telmisaran 80mg  daily.    HLD: -- Continue simvastatin  20mg  daily.  Candidal groin infection: -- Rx nystatin .    Medication Adjustments/Labs and Tests Ordered: Current medicines are reviewed at length with the patient today.  Concerns regarding medicines are outlined above.  Orders Placed This Encounter  Procedures   EKG 12-Lead   ECHOCARDIOGRAM COMPLETE   Meds ordered this encounter  Medications   nystatin  ointment (MYCOSTATIN )    Sig: Apply 1 Application topically 2 (two) times daily.    Dispense:  30 g    Refill:  3    Supervising Provider:   COOPER, MICHAEL [[id number]]   azithromycin  (ZITHROMAX ) 500 MG tablet    Sig: Take 1 tablet (500 mg total) by mouth as directed 1 hour before any dental work including cleanings.    Dispense:  6 tablet    Refill:  12    Supervising Provider:   [name] [[id number]]    Patient Instructions  Medication Instructions:  Your physician has recommended you make the following change in your medication:  START nystatin  cream, applying to affected area twice a day until clear.  START Zithromax  by mouth as directed 1 hour before any dental work including cleanings. *If you need a refill on your cardiac medications before your next appointment, please call  your pharmacy*  Lab Work: None needed If you have labs (blood work) drawn today and your tests are completely normal, you will receive your results only by: MyChart Message (if you have MyChart) OR A paper copy in the mail If you have any lab test that is abnormal or we need to change your treatment, we will call you to review the results.  Testing/Procedures: 09/18/2024 Your physician has requested that you have  an echocardiogram. Echocardiography is a painless test that uses sound waves to create images of your heart. It provides your doctor with information about the size and shape of your heart and how well your hearts chambers and valves are working. This procedure takes approximately one hour. There are no restrictions for this procedure. Please do NOT wear cologne, perfume, aftershave, or lotions (deodorant is allowed). Please arrive 15 minutes prior to your appointment time.  Please note: We ask at that you not bring children with you during ultrasound (echo/ vascular) testing. Due to room size and safety concerns, children are not allowed in the ultrasound rooms during exams. Our front office staff cannot provide observation of children in our lobby area while testing is being conducted. An adult accompanying a patient to their appointment will only be allowed in the ultrasound room at the discretion of the ultrasound technician under special circumstances. We apologize for any inconvenience.   Follow-Up: At [facility], you and your health needs are our priority.  As part of our continuing mission to provide you with exceptional heart care, our providers are all part of one team.  This team includes your primary Cardiologist (physician) and Advanced Practice Providers or APPs (Physician Assistants and Nurse Practitioners) who all work together to provide you with the care you need, when you need it.  Your next appointment:   As scheduled on 09/18/2024  Provider:   Izetta Hummer, PA-C  We recommend signing up for the patient portal called MyChart.  Sign up information is provided on this After Visit Summary.  MyChart is used to connect with patients for Virtual Visits (Telemedicine).  Patients are able to view lab/test results, encounter notes, upcoming appointments, etc.  Non-urgent messages can be sent to your provider as well.   To learn more about what you can do with MyChart, go to [email or web address].          Signed, Lamarr Hummer, PA-C  08/21/2024 4:46 PM    [facility]     [1]  Current Meds  Medication Sig   Albuterol-Budesonide (AIRSUPRA ) 90-80 MCG/ACT AERO Inhale 2 puffs into the lungs every 4 (four) hours as needed (wheezing).   aspirin  81 MG tablet Take 81 mg by mouth daily.   azithromycin  (ZITHROMAX ) 500 MG tablet Take 1 tablet (500 mg total) by mouth as directed 1 hour before any dental work including cleanings.   ibuprofen (ADVIL) 200 MG tablet Take 200 mg by mouth every 6 (six) hours as needed for moderate pain (pain score 4-6).   nystatin  ointment (MYCOSTATIN ) Apply 1 Application topically 2 (two) times daily.   simvastatin  (ZOCOR ) 20 MG tablet Take 1 tablet (20 mg total) by mouth at bedtime.   sodium chloride  (OCEAN) 0.65 % SOLN nasal spray Place 1 spray into both nostrils as needed for congestion.   telmisartan  (MICARDIS ) 80 MG tablet TAKE 1 TABLET BY MOUTH EVERY DAY   timolol  (TIMOPTIC ) 0.5 % ophthalmic solution Place 1 drop into the right eye 2 (two) times daily.   "

## 2024-08-27 ENCOUNTER — Telehealth: Admitting: *Deleted

## 2024-09-18 ENCOUNTER — Ambulatory Visit (HOSPITAL_COMMUNITY)

## 2024-09-18 ENCOUNTER — Ambulatory Visit: Admitting: Physician Assistant
